# Patient Record
Sex: Female | Born: 1975 | Race: White | Hispanic: No | Marital: Married | Smoking: Never smoker
Health system: Southern US, Community
[De-identification: ages and names within clinical notes are randomized; demographics above are authoritative.]

## PROBLEM LIST (undated history)

## (undated) DIAGNOSIS — F419 Anxiety disorder, unspecified: Secondary | ICD-10-CM

## (undated) DIAGNOSIS — D649 Anemia, unspecified: Secondary | ICD-10-CM

## (undated) DIAGNOSIS — F32A Depression, unspecified: Secondary | ICD-10-CM

## (undated) DIAGNOSIS — K219 Gastro-esophageal reflux disease without esophagitis: Secondary | ICD-10-CM

## (undated) DIAGNOSIS — R112 Nausea with vomiting, unspecified: Secondary | ICD-10-CM

## (undated) DIAGNOSIS — R109 Unspecified abdominal pain: Secondary | ICD-10-CM

## (undated) DIAGNOSIS — K589 Irritable bowel syndrome without diarrhea: Secondary | ICD-10-CM

## (undated) DIAGNOSIS — Z9889 Other specified postprocedural states: Secondary | ICD-10-CM

## (undated) DIAGNOSIS — I10 Essential (primary) hypertension: Secondary | ICD-10-CM

## (undated) DIAGNOSIS — R51 Headache: Secondary | ICD-10-CM

## (undated) DIAGNOSIS — E0590 Thyrotoxicosis, unspecified without thyrotoxic crisis or storm: Secondary | ICD-10-CM

## (undated) DIAGNOSIS — G8929 Other chronic pain: Secondary | ICD-10-CM

## (undated) DIAGNOSIS — F329 Major depressive disorder, single episode, unspecified: Secondary | ICD-10-CM

## (undated) DIAGNOSIS — K3184 Gastroparesis: Secondary | ICD-10-CM

## (undated) HISTORY — PX: COLONOSCOPY: SHX174

## (undated) HISTORY — DX: Essential (primary) hypertension: I10

## (undated) HISTORY — PX: TUBAL LIGATION: SHX77

## (undated) HISTORY — PX: APPENDECTOMY: SHX54

## (undated) HISTORY — PX: BREAST SURGERY: SHX581

## (undated) HISTORY — DX: Irritable bowel syndrome, unspecified: K58.9

## (undated) HISTORY — DX: Other chronic pain: G89.29

## (undated) HISTORY — PX: ESOPHAGOGASTRODUODENOSCOPY: SHX1529

## (undated) HISTORY — DX: Unspecified abdominal pain: R10.9

---

## 1998-01-29 ENCOUNTER — Other Ambulatory Visit: Admission: RE | Admit: 1998-01-29 | Discharge: 1998-01-29 | Payer: Self-pay | Admitting: *Deleted

## 1999-02-24 ENCOUNTER — Other Ambulatory Visit: Admission: RE | Admit: 1999-02-24 | Discharge: 1999-02-24 | Payer: Self-pay | Admitting: *Deleted

## 2000-05-09 ENCOUNTER — Other Ambulatory Visit: Admission: RE | Admit: 2000-05-09 | Discharge: 2000-05-09 | Payer: Self-pay | Admitting: *Deleted

## 2001-05-13 ENCOUNTER — Other Ambulatory Visit: Admission: RE | Admit: 2001-05-13 | Discharge: 2001-05-13 | Payer: Self-pay | Admitting: *Deleted

## 2001-11-19 ENCOUNTER — Other Ambulatory Visit: Admission: RE | Admit: 2001-11-19 | Discharge: 2001-11-19 | Payer: Self-pay | Admitting: *Deleted

## 2001-12-23 ENCOUNTER — Ambulatory Visit (HOSPITAL_COMMUNITY): Admission: RE | Admit: 2001-12-23 | Discharge: 2001-12-23 | Payer: Self-pay | Admitting: Family Medicine

## 2001-12-23 ENCOUNTER — Encounter: Payer: Self-pay | Admitting: Family Medicine

## 2001-12-26 ENCOUNTER — Encounter: Payer: Self-pay | Admitting: Family Medicine

## 2001-12-26 ENCOUNTER — Ambulatory Visit (HOSPITAL_COMMUNITY): Admission: RE | Admit: 2001-12-26 | Discharge: 2001-12-26 | Payer: Self-pay | Admitting: Family Medicine

## 2001-12-26 ENCOUNTER — Encounter (INDEPENDENT_AMBULATORY_CARE_PROVIDER_SITE_OTHER): Payer: Self-pay | Admitting: *Deleted

## 2002-01-06 ENCOUNTER — Ambulatory Visit (HOSPITAL_COMMUNITY): Admission: RE | Admit: 2002-01-06 | Discharge: 2002-01-06 | Payer: Self-pay | Admitting: Family Medicine

## 2002-01-06 ENCOUNTER — Encounter (INDEPENDENT_AMBULATORY_CARE_PROVIDER_SITE_OTHER): Payer: Self-pay | Admitting: *Deleted

## 2002-01-06 ENCOUNTER — Encounter: Payer: Self-pay | Admitting: Family Medicine

## 2002-04-15 ENCOUNTER — Ambulatory Visit (HOSPITAL_COMMUNITY): Admission: RE | Admit: 2002-04-15 | Discharge: 2002-04-15 | Payer: Self-pay | Admitting: Neurology

## 2002-04-15 ENCOUNTER — Encounter: Payer: Self-pay | Admitting: Neurology

## 2002-05-15 ENCOUNTER — Other Ambulatory Visit: Admission: RE | Admit: 2002-05-15 | Discharge: 2002-05-15 | Payer: Self-pay | Admitting: *Deleted

## 2003-06-02 ENCOUNTER — Other Ambulatory Visit: Admission: RE | Admit: 2003-06-02 | Discharge: 2003-06-02 | Payer: Self-pay | Admitting: *Deleted

## 2003-08-05 HISTORY — PX: WISDOM TOOTH EXTRACTION: SHX21

## 2004-06-29 ENCOUNTER — Ambulatory Visit (HOSPITAL_COMMUNITY): Admission: RE | Admit: 2004-06-29 | Discharge: 2004-06-29 | Payer: Self-pay | Admitting: Family Medicine

## 2005-05-20 ENCOUNTER — Inpatient Hospital Stay (HOSPITAL_COMMUNITY): Admission: AD | Admit: 2005-05-20 | Discharge: 2005-05-20 | Payer: Self-pay | Admitting: Obstetrics and Gynecology

## 2005-05-22 ENCOUNTER — Inpatient Hospital Stay (HOSPITAL_COMMUNITY): Admission: AD | Admit: 2005-05-22 | Discharge: 2005-05-22 | Payer: Self-pay | Admitting: *Deleted

## 2005-12-27 ENCOUNTER — Inpatient Hospital Stay (HOSPITAL_COMMUNITY): Admission: AD | Admit: 2005-12-27 | Discharge: 2005-12-27 | Payer: Self-pay | Admitting: Obstetrics and Gynecology

## 2006-01-08 ENCOUNTER — Inpatient Hospital Stay (HOSPITAL_COMMUNITY): Admission: AD | Admit: 2006-01-08 | Discharge: 2006-01-13 | Payer: Self-pay | Admitting: Obstetrics and Gynecology

## 2006-01-19 ENCOUNTER — Encounter: Admission: RE | Admit: 2006-01-19 | Discharge: 2006-01-19 | Payer: Self-pay | Admitting: Obstetrics and Gynecology

## 2006-01-22 ENCOUNTER — Ambulatory Visit (HOSPITAL_COMMUNITY): Admission: RE | Admit: 2006-01-22 | Discharge: 2006-01-22 | Payer: Self-pay | Admitting: Family Medicine

## 2006-01-25 ENCOUNTER — Encounter (HOSPITAL_COMMUNITY): Admission: RE | Admit: 2006-01-25 | Discharge: 2006-02-24 | Payer: Self-pay | Admitting: Family Medicine

## 2006-01-25 ENCOUNTER — Encounter (INDEPENDENT_AMBULATORY_CARE_PROVIDER_SITE_OTHER): Payer: Self-pay | Admitting: *Deleted

## 2006-02-02 HISTORY — PX: BASAL CELL CARCINOMA EXCISION: SHX1214

## 2006-09-15 IMAGING — CR DG CHEST 2V
2 series · 2 of 2 positions shown · non-contrast
Comparison: 06/29/04.

CLINICAL DATA: Short of breath.
 CHEST X-RAY:

[view not recorded (1 of 2)]
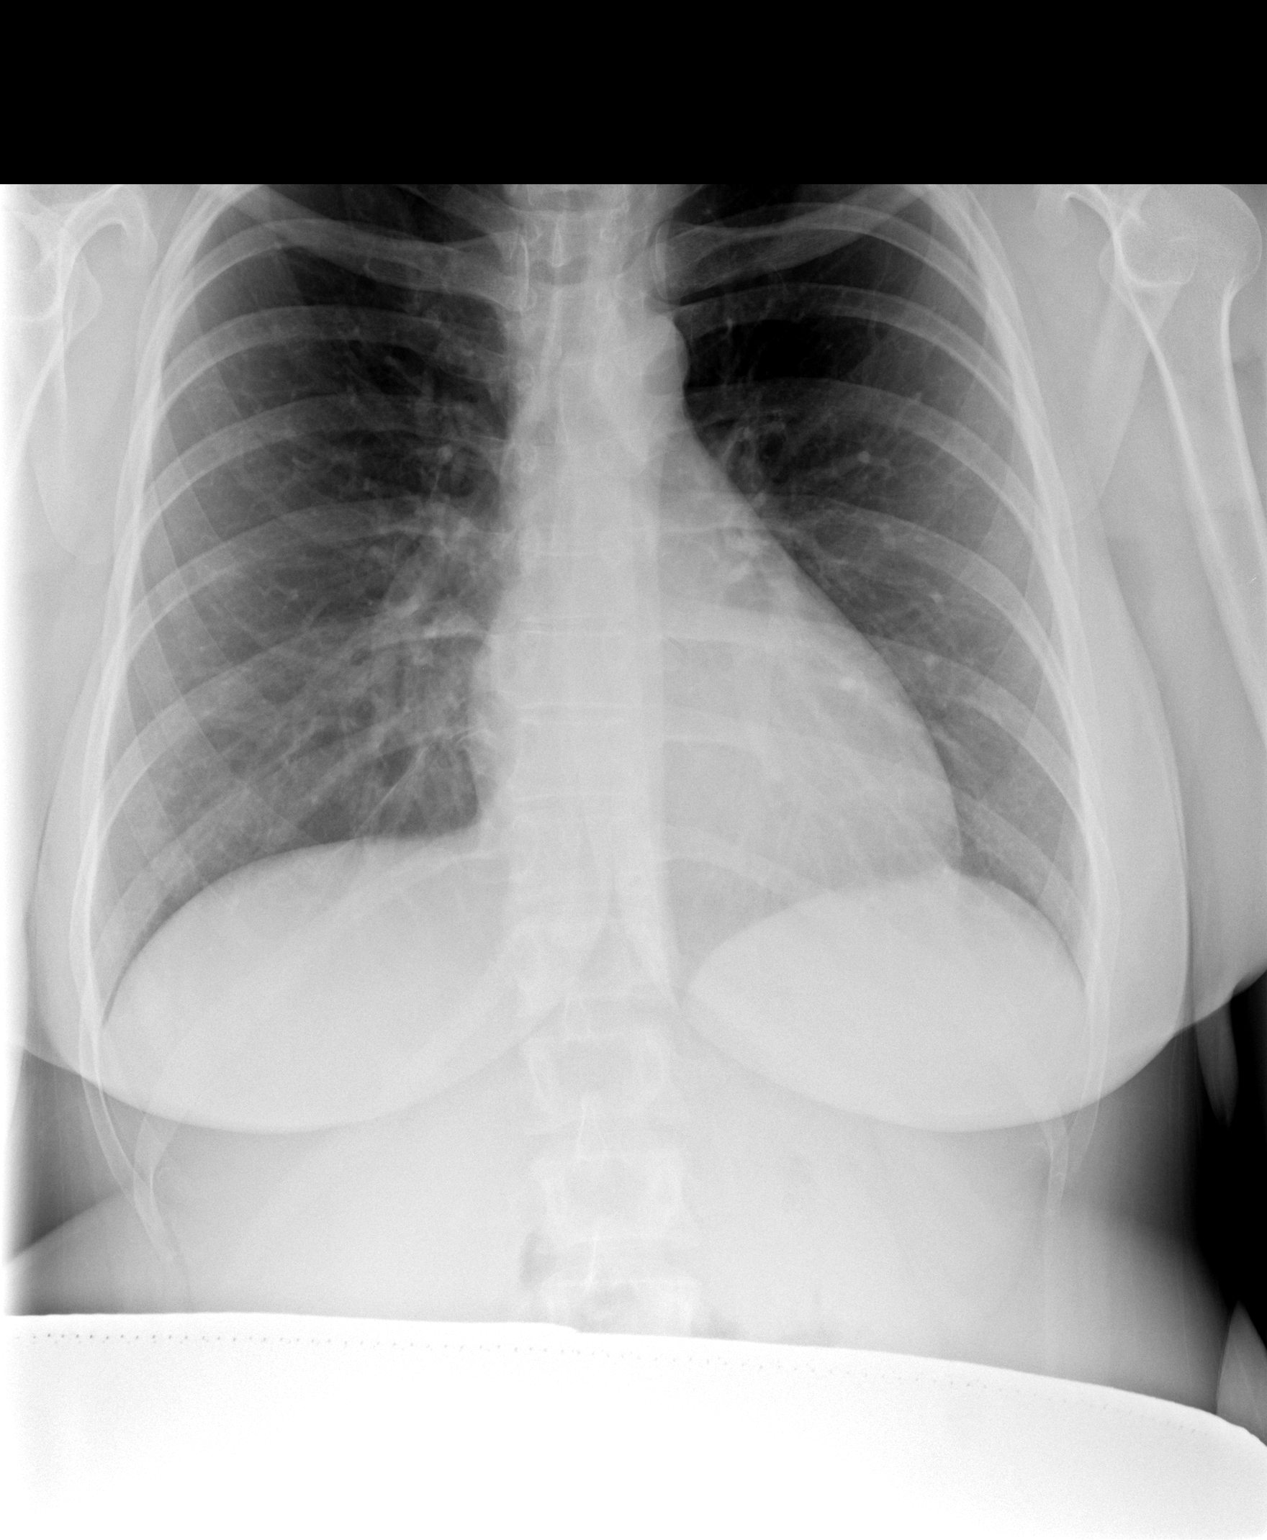

[view not recorded (2 of 2)]
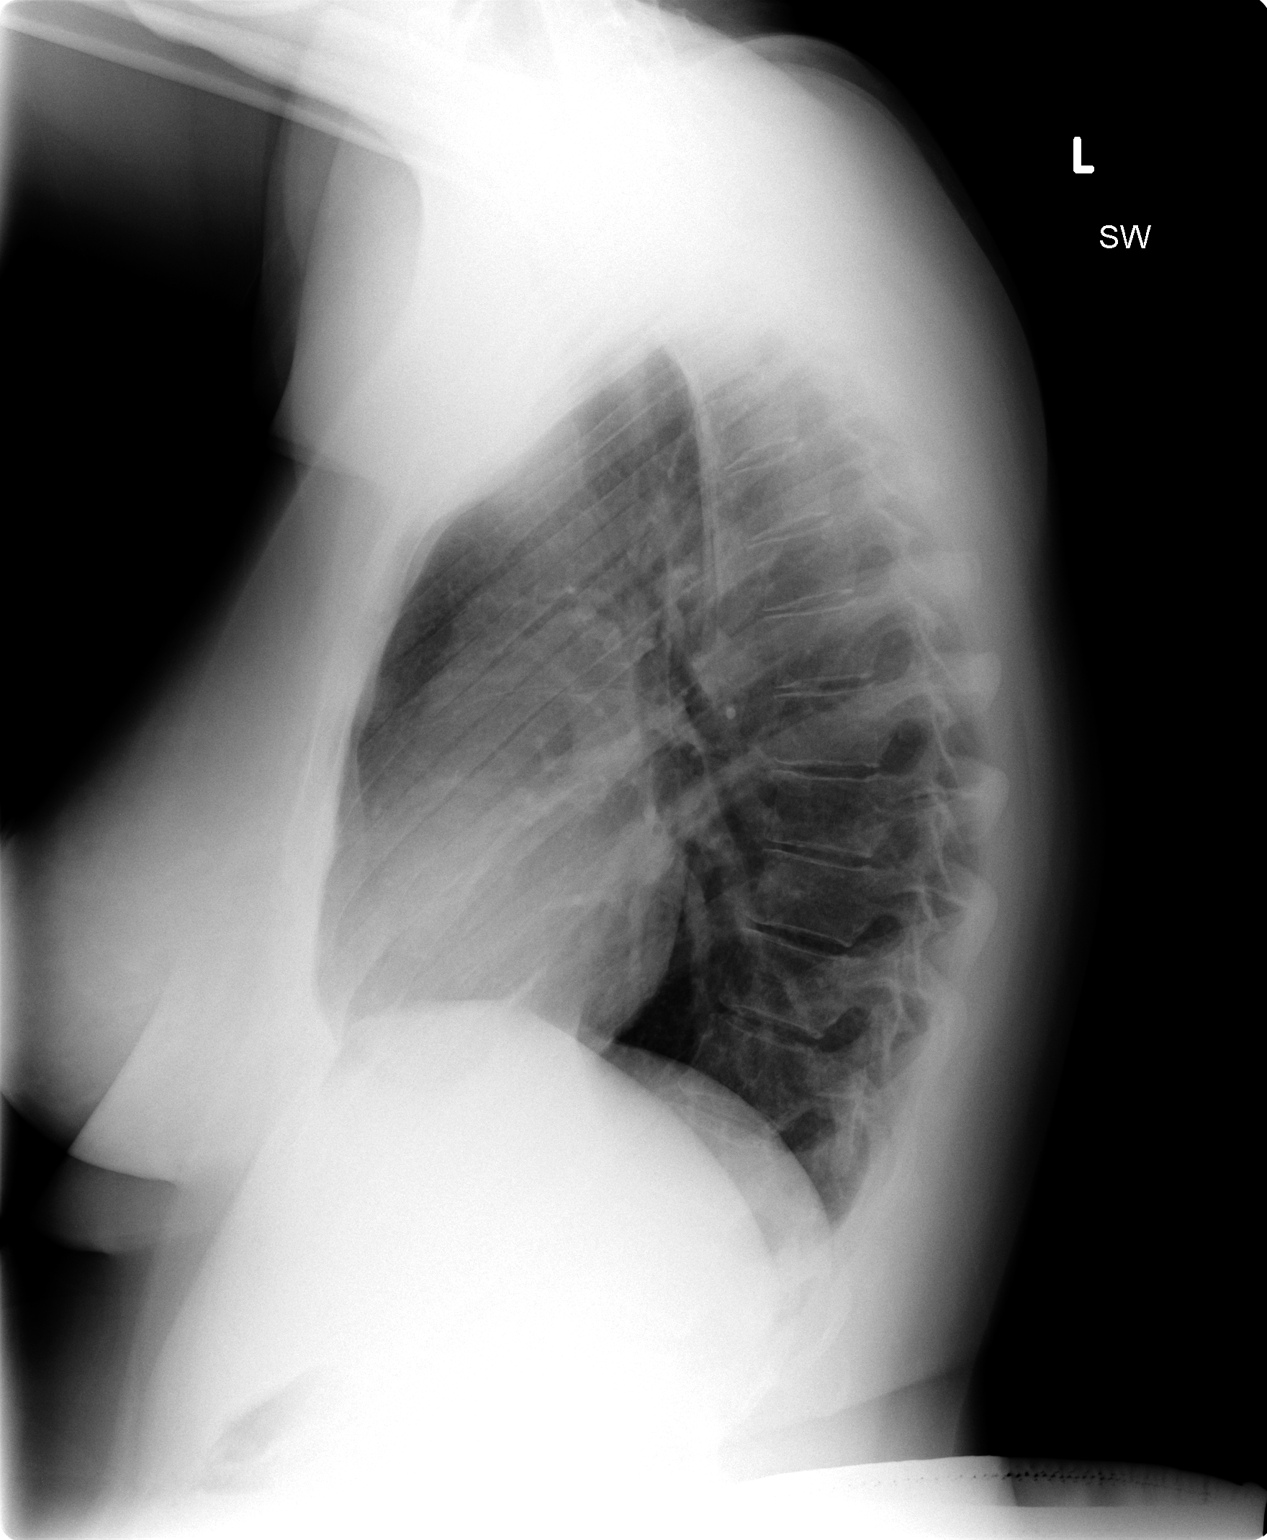

[2 of 2 positions shown; findings below may reference images not displayed]

The heart size and mediastinal contours are within normal limits.  Both lungs are clear.  The visualized skeletal structures are unremarkable.
IMPRESSION: No active cardiopulmonary disease.

## 2006-10-24 ENCOUNTER — Encounter: Payer: Self-pay | Admitting: Physician Assistant

## 2006-10-24 ENCOUNTER — Ambulatory Visit: Payer: Self-pay | Admitting: Internal Medicine

## 2006-10-24 ENCOUNTER — Encounter: Payer: Self-pay | Admitting: Internal Medicine

## 2006-11-12 ENCOUNTER — Encounter: Payer: Self-pay | Admitting: Internal Medicine

## 2006-11-12 ENCOUNTER — Ambulatory Visit: Payer: Self-pay | Admitting: Internal Medicine

## 2006-11-12 ENCOUNTER — Encounter: Payer: Self-pay | Admitting: Physician Assistant

## 2006-11-12 ENCOUNTER — Ambulatory Visit (HOSPITAL_COMMUNITY): Admission: RE | Admit: 2006-11-12 | Discharge: 2006-11-12 | Payer: Self-pay | Admitting: Internal Medicine

## 2006-11-14 ENCOUNTER — Ambulatory Visit: Payer: Self-pay | Admitting: Internal Medicine

## 2009-02-05 ENCOUNTER — Encounter (INDEPENDENT_AMBULATORY_CARE_PROVIDER_SITE_OTHER): Payer: Self-pay | Admitting: *Deleted

## 2009-02-05 ENCOUNTER — Ambulatory Visit (HOSPITAL_COMMUNITY): Admission: RE | Admit: 2009-02-05 | Discharge: 2009-02-05 | Payer: Self-pay | Admitting: Family Medicine

## 2009-04-29 ENCOUNTER — Encounter (INDEPENDENT_AMBULATORY_CARE_PROVIDER_SITE_OTHER): Payer: Self-pay | Admitting: *Deleted

## 2009-04-29 ENCOUNTER — Ambulatory Visit (HOSPITAL_COMMUNITY): Admission: RE | Admit: 2009-04-29 | Discharge: 2009-04-29 | Payer: Self-pay | Admitting: Family Medicine

## 2009-04-30 ENCOUNTER — Telehealth (INDEPENDENT_AMBULATORY_CARE_PROVIDER_SITE_OTHER): Payer: Self-pay | Admitting: *Deleted

## 2009-05-03 ENCOUNTER — Ambulatory Visit: Payer: Self-pay | Admitting: Internal Medicine

## 2009-05-03 ENCOUNTER — Encounter: Payer: Self-pay | Admitting: Physician Assistant

## 2009-05-03 DIAGNOSIS — R079 Chest pain, unspecified: Secondary | ICD-10-CM | POA: Insufficient documentation

## 2009-05-03 DIAGNOSIS — K219 Gastro-esophageal reflux disease without esophagitis: Secondary | ICD-10-CM

## 2009-05-03 DIAGNOSIS — R197 Diarrhea, unspecified: Secondary | ICD-10-CM | POA: Insufficient documentation

## 2009-05-20 DIAGNOSIS — Z8719 Personal history of other diseases of the digestive system: Secondary | ICD-10-CM | POA: Insufficient documentation

## 2009-05-20 DIAGNOSIS — R109 Unspecified abdominal pain: Secondary | ICD-10-CM | POA: Insufficient documentation

## 2009-05-26 ENCOUNTER — Ambulatory Visit: Payer: Self-pay | Admitting: Internal Medicine

## 2009-05-26 ENCOUNTER — Telehealth: Payer: Self-pay | Admitting: Internal Medicine

## 2009-05-27 ENCOUNTER — Ambulatory Visit: Payer: Self-pay | Admitting: Internal Medicine

## 2009-05-27 ENCOUNTER — Encounter: Payer: Self-pay | Admitting: Internal Medicine

## 2009-05-31 ENCOUNTER — Encounter: Payer: Self-pay | Admitting: Internal Medicine

## 2009-06-03 ENCOUNTER — Telehealth: Payer: Self-pay | Admitting: Internal Medicine

## 2009-06-14 ENCOUNTER — Ambulatory Visit: Payer: Self-pay | Admitting: Internal Medicine

## 2009-06-16 LAB — CONVERTED CEMR LAB
ALT: 13 units/L (ref 0–35)
Albumin: 4.4 g/dL (ref 3.5–5.2)
Alkaline Phosphatase: 51 units/L (ref 39–117)
Amylase: 112 units/L (ref 27–131)
Basophils Relative: 1.2 % (ref 0.0–3.0)
Eosinophils Relative: 2.1 % (ref 0.0–5.0)
Lipase: 37 units/L (ref 11.0–59.0)
Lymphs Abs: 2.3 10*3/uL (ref 0.7–4.0)
Monocytes Relative: 6.8 % (ref 3.0–12.0)
Neutrophils Relative %: 56 % (ref 43.0–77.0)
RDW: 12 % (ref 11.5–14.6)
Total Protein: 7.4 g/dL (ref 6.0–8.3)

## 2009-06-18 ENCOUNTER — Encounter: Admission: RE | Admit: 2009-06-18 | Discharge: 2009-06-18 | Payer: Self-pay | Admitting: Internal Medicine

## 2009-06-18 ENCOUNTER — Telehealth: Payer: Self-pay | Admitting: Internal Medicine

## 2009-06-21 ENCOUNTER — Telehealth: Payer: Self-pay | Admitting: Internal Medicine

## 2009-07-09 ENCOUNTER — Telehealth: Payer: Self-pay | Admitting: Internal Medicine

## 2009-08-09 ENCOUNTER — Telehealth: Payer: Self-pay | Admitting: Internal Medicine

## 2009-08-10 ENCOUNTER — Ambulatory Visit: Payer: Self-pay | Admitting: Internal Medicine

## 2009-08-17 ENCOUNTER — Encounter: Admission: RE | Admit: 2009-08-17 | Discharge: 2009-08-17 | Payer: Self-pay | Admitting: Internal Medicine

## 2009-08-17 ENCOUNTER — Telehealth: Payer: Self-pay | Admitting: Internal Medicine

## 2009-08-18 ENCOUNTER — Emergency Department (HOSPITAL_COMMUNITY): Admission: EM | Admit: 2009-08-18 | Discharge: 2009-08-18 | Payer: Self-pay | Admitting: Emergency Medicine

## 2009-08-18 ENCOUNTER — Encounter (INDEPENDENT_AMBULATORY_CARE_PROVIDER_SITE_OTHER): Payer: Self-pay | Admitting: *Deleted

## 2009-08-20 ENCOUNTER — Encounter (HOSPITAL_COMMUNITY): Admission: RE | Admit: 2009-08-20 | Discharge: 2009-09-01 | Payer: Self-pay | Admitting: Family Medicine

## 2009-08-20 ENCOUNTER — Ambulatory Visit: Payer: Self-pay | Admitting: Internal Medicine

## 2009-08-20 ENCOUNTER — Telehealth: Payer: Self-pay | Admitting: Internal Medicine

## 2009-08-20 ENCOUNTER — Encounter (INDEPENDENT_AMBULATORY_CARE_PROVIDER_SITE_OTHER): Payer: Self-pay | Admitting: *Deleted

## 2009-08-20 DIAGNOSIS — F341 Dysthymic disorder: Secondary | ICD-10-CM | POA: Insufficient documentation

## 2009-08-20 HISTORY — DX: Dysthymic disorder: F34.1

## 2009-09-04 DIAGNOSIS — E0590 Thyrotoxicosis, unspecified without thyrotoxic crisis or storm: Secondary | ICD-10-CM

## 2009-09-04 HISTORY — DX: Thyrotoxicosis, unspecified without thyrotoxic crisis or storm: E05.90

## 2009-09-17 ENCOUNTER — Encounter: Payer: Self-pay | Admitting: Internal Medicine

## 2009-09-20 ENCOUNTER — Telehealth: Payer: Self-pay | Admitting: Internal Medicine

## 2009-09-28 ENCOUNTER — Ambulatory Visit: Payer: Self-pay | Admitting: Internal Medicine

## 2009-09-29 ENCOUNTER — Telehealth: Payer: Self-pay | Admitting: Internal Medicine

## 2009-09-29 ENCOUNTER — Encounter (INDEPENDENT_AMBULATORY_CARE_PROVIDER_SITE_OTHER): Payer: Self-pay | Admitting: *Deleted

## 2009-09-30 ENCOUNTER — Encounter: Payer: Self-pay | Admitting: Internal Medicine

## 2009-10-13 ENCOUNTER — Ambulatory Visit (HOSPITAL_COMMUNITY): Admission: RE | Admit: 2009-10-13 | Discharge: 2009-10-14 | Payer: Self-pay | Admitting: General Surgery

## 2009-10-13 ENCOUNTER — Encounter (INDEPENDENT_AMBULATORY_CARE_PROVIDER_SITE_OTHER): Payer: Self-pay | Admitting: General Surgery

## 2009-10-13 ENCOUNTER — Encounter (INDEPENDENT_AMBULATORY_CARE_PROVIDER_SITE_OTHER): Payer: Self-pay | Admitting: *Deleted

## 2009-10-25 ENCOUNTER — Ambulatory Visit: Payer: Self-pay | Admitting: Internal Medicine

## 2009-10-25 LAB — CONVERTED CEMR LAB
Eosinophils Absolute: 0.1 10*3/uL (ref 0.0–0.7)
Eosinophils Relative: 1.2 % (ref 0.0–5.0)
HCT: 37.4 % (ref 36.0–46.0)
Hemoglobin: 12.5 g/dL (ref 12.0–15.0)
Lymphocytes Relative: 25.8 % (ref 12.0–46.0)
Lymphs Abs: 1.9 10*3/uL (ref 0.7–4.0)
Monocytes Absolute: 0.4 10*3/uL (ref 0.1–1.0)
Monocytes Relative: 6.1 % (ref 3.0–12.0)
Neutro Abs: 4.6 10*3/uL (ref 1.4–7.7)
Neutrophils Relative %: 64.6 % (ref 43.0–77.0)
Platelets: 295 10*3/uL (ref 150.0–400.0)
RBC: 4 M/uL (ref 3.87–5.11)
Sed Rate: 8 mm/hr (ref 0–22)
TSH: 0.28 microintl units/mL — ABNORMAL LOW (ref 0.35–5.50)
Vitamin B-12: 367 pg/mL (ref 211–911)

## 2009-10-27 ENCOUNTER — Encounter: Payer: Self-pay | Admitting: Internal Medicine

## 2009-11-01 ENCOUNTER — Encounter (HOSPITAL_COMMUNITY): Admission: RE | Admit: 2009-11-01 | Discharge: 2009-12-01 | Payer: Self-pay | Admitting: Family Medicine

## 2009-11-01 ENCOUNTER — Encounter (INDEPENDENT_AMBULATORY_CARE_PROVIDER_SITE_OTHER): Payer: Self-pay | Admitting: *Deleted

## 2009-11-25 ENCOUNTER — Telehealth (INDEPENDENT_AMBULATORY_CARE_PROVIDER_SITE_OTHER): Payer: Self-pay | Admitting: *Deleted

## 2010-09-25 ENCOUNTER — Encounter: Payer: Self-pay | Admitting: Internal Medicine

## 2010-09-25 ENCOUNTER — Encounter: Payer: Self-pay | Admitting: General Surgery

## 2010-10-04 NOTE — Assessment & Plan Note (Signed)
Summary: F/U FROM ENDO.                Alicia Owens   History of Present Illness Visit Type: Follow-up Visit Primary GI MD: Lina Sar MD Primary Provider: Lilyan Punt, MD Requesting Provider: n/a Chief Complaint: F/u from endo  History of Present Illness:   This is a 35 year old white female with functional abdominal pain who has been through an exhaustive GI workup which has included a negative upper endoscopy and small bowel biopsies. She had a normal colonoscopy in September 2010. and a normal barium enema which ruled out a mobile cecum. She had a normal CT scan of the abdomen and pelvis in November 2010. Her upper abdominal ultrasound and HIDA scans were normal showing a 73% ejection fraction. She underwent a laparoscopic surgery by her gynecologist and by Dr.Toth 10 days ago with findings of no endometriosis. She underwent an appendectomy and she was noted to have a small right inguinal hernia. The gallbladder appeared normal and was not removed. She is on multiple medications for headaches and her main symptoms today is constant nausea for which she takes Zofran 4 mg 1-2 a day. She denies any excessive stress. She denies any problems with her marriage, problems with her in-laws or financial problems. She has been followed by Dr.J.Love for migraine headaches and is scheduled for an MRI of  the neck and spine. Additional complaints are   numbness of her legs and anxiety. Her weight has decreased from an initial 150 pounds to a current 123 pounds. She was 130 pounds during her last visit in December 2010.   GI Review of Systems      Denies abdominal pain, acid reflux, belching, bloating, chest pain, dysphagia with liquids, dysphagia with solids, heartburn, loss of appetite, nausea, vomiting, vomiting blood, weight loss, and  weight gain.        Denies anal fissure, black tarry stools, change in bowel habit, constipation, diarrhea, diverticulosis, fecal incontinence, heme positive stool,  hemorrhoids, irritable bowel syndrome, jaundice, light color stool, liver problems, rectal bleeding, and  rectal pain.    Current Medications (verified): 1)  Claritin 10 Mg Tabs (Loratadine) .... Take 1 Tablet By Mouth Once A Day 2)  Topiramate 100 Mg Tabs (Topiramate) .... .qdtdab 3)  Sumatriptan Succinate 100 Mg Tabs (Sumatriptan Succinate) .... Take As Needed For Headache 4)  Ibuprofen 200 Mg Tabs (Ibuprofen) .... Take 2 Tablets By Mouth As Needed 5)  Levsin/sl 0.125 Mg Subl (Hyoscyamine Sulfate) .... Put One Under The Tongue Evey 4-6 Hours As Needed For Cramping and Spasms 6)  Dicyclomine Hcl 20 Mg Tabs (Dicyclomine Hcl) .... As Needed 7)  Prilosec 20 Mg Cpdr (Omeprazole) .... Take 1 Tablet By Mouth Every Morning and With Dinner 8)  Tylenol Extra Strength 500 Mg Tabs (Acetaminophen) .... As Needed 9)  Zofran 4 Mg Tabs (Ondansetron Hcl) .... Take 1 Every 4-6 Hours  As Needed For Nausea. 10)  Percocet 5-325 Mg Tabs (Oxycodone-Acetaminophen) .... Take 1 Every 6 Hours As Needed For Pain 11)  Xanax 0.5 Mg  Tabs (Alprazolam) .... By Mouth Once or Twice A Day 12)  Aleve 220 Mg Tabs (Naproxen Sodium) .... As Needed 13)  Gas-X 80 Mg Chew (Simethicone) .... One or Two Tablets By Mouth As Needed 14)  Cyclobenzaprine Hcl 10 Mg Tabs (Cyclobenzaprine Hcl) .... One Tablet By Mouth Once Daily  Allergies (verified): 1)  ! Codeine 2)  ! Sulfa 3)  ! Phenergan  Past History:  Past Medical History:  Reviewed history from 05/20/2009 and no changes required. Current Problems:  GASTRITIS, HX OF (ICD-V12.79) ABDOMINAL PAIN, UNSPECIFIED SITE (ICD-789.00) CHEST PAIN (ICD-786.50) GERD (ICD-530.81) DIARRHEA (ICD-787.91)    Past Surgical History: Reviewed history from 05/03/2009 and no changes required. C-Section Breast Reduction Wisdom Teeth removal  Family History: Reviewed history from 05/03/2009 and no changes required. Family History of Breast Cancer: Great Grandmother No FH of Colon  Cancer:  Social History: Reviewed history from 05/03/2009 and no changes required. 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  Alcohol Use - yes-1 drink per month Daily Caffeine Use-1-2 cups daily Illicit Drug Use - no Patient does not get regular exercise.   Review of Systems  The patient denies allergy/sinus, anemia, anxiety-new, arthritis/joint pain, back pain, blood in urine, breast changes/lumps, change in vision, confusion, cough, coughing up blood, depression-new, fainting, fatigue, fever, headaches-new, hearing problems, heart murmur, heart rhythm changes, itching, menstrual pain, muscle pains/cramps, night sweats, nosebleeds, pregnancy symptoms, shortness of breath, skin rash, sleeping problems, sore throat, swelling of feet/legs, swollen lymph glands, thirst - excessive , urination - excessive , urination changes/pain, urine leakage, vision changes, and voice change.         Pertinent positive and negative review of systems were noted in the above HPI. All other ROS was otherwise negative.   Vital Signs:  Patient profile:   35 year old female Height:      61 inches Weight:      123 pounds BMI:     23.32 BSA:     1.54 Pulse rate:   86 / minute Pulse rhythm:   regular BP sitting:   98 / 62  (left arm) Cuff size:   regular  Vitals Entered By: Ok Anis CMA (October 25, 2009 8:57 AM)  Physical Exam  General:  alert and oriented, very cooperative but appears depressed. Eyes:  PERRLA, no icterus. Mouth:  No deformity or lesions, dentition normal. Neck:  Supple; no masses or thyromegaly. Lungs:  Clear throughout to auscultation. Heart:  Regular rate and rhythm; no murmurs, rubs,  or bruits. Abdomen:  soft but very tender abdomen with laparoscopic incision site in the left the lower and upper quadrants as well as in the periumbilical area. There is mild bruising and tenderness around the incision points. Bowel sounds are normal active. There is no distention and no  palpable mass. Extremities:  No clubbing, cyanosis, edema or deformities noted. Neurologic:  Alert and oriented x4;  grossly normal neurologically. Skin:  Intact without significant lesions or rashes. Psych:  Alert and cooperative. Normal mood and affect.   Impression & Recommendations:  Problem # 1:  ABDOMINAL PAIN, UNSPECIFIED SITE (ICD-789.00) Patient has right upper quadrant abdominal pain of unknown etiology. There is no evidence of organic disease after a complete workup including laparoscopic exploration. I feel that her abdominal pain is functional and in some way associated with the depression. It could also represent an abdominal migraine. We will try her on Reglan 5 mg times a day before meals for nausea in addition to Zofran. No further GI evaluation is planned at this point. She needs to go back to see Dr Gerda Diss and possibly to see a psychiatrist. She is also going to follow up with Dr. Sandria Manly for migraine headaches at which time he will rule out a brain tumor, MS etc. Orders: TLB-TSH (Thyroid Stimulating Hormone) (84443-TSH) TLB-CBC Platelet - w/Differential (85025-CBCD) TLB-Sedimentation Rate (ESR) (85652-ESR) TLB-B12, Serum-Total ONLY (82607-B12) TLB-A1C / Hgb A1C (Glycohemoglobin) (83036-A1C) TLB-Glucose, QUANT (82947-GLU)  Problem #  2:  ANXIETY DEPRESSION (ICD-300.4) This may be  her only  health problems at this time. She is Xanax as well as Percocet for abdominal pain. At her request today, we are going to check her TSH, CBC, sedimentation rate, B12 and hemoglobin A1C.  Problem # 3:  GERD (ICD-530.81) controlled with Prilosec 20 mg twice a day. Orders: TLB-TSH (Thyroid Stimulating Hormone) (84443-TSH) TLB-CBC Platelet - w/Differential (85025-CBCD) TLB-Sedimentation Rate (ESR) (85652-ESR) TLB-B12, Serum-Total ONLY (82607-B12) TLB-A1C / Hgb A1C (Glycohemoglobin) (83036-A1C) TLB-Glucose, QUANT (82947-GLU)  Patient Instructions: 1)  Gastrointestinal workup has been  completed and no further tests are planned at this time. 2)  Followup with Dr.Love for evaluation of headaches and to rule out other neurological or CNS disorders. 3)  Follow up with Dr.Luking; consider psychiatric followup. 4)  Reglan 5 mg p.o. t.i.d. before meals on a trial basis. 5)  Lab tests today. 6)  Copy sent to : Dr Kathie Rhodes.Luking, Dr Ileene Rubens, Dr Billy Coast, Dr Demetrius Charity.Toth 7)  The medication list was reviewed and reconciled.  All changed / newly prescribed medications were explained.  A complete medication list was provided to the patient / caregiver. Prescriptions: REGLAN 5 MG TABS (METOCLOPRAMIDE HCL) Take 1 tablet by mouth three times a day before meals.  #90 x 1   Entered by:   Hortense Ramal CMA (AAMA)   Authorized by:   Hart Carwin MD   Signed by:   Hortense Ramal CMA (AAMA) on 10/25/2009   Method used:   Electronically to        [facility]* (retail)       [address]       [place], [place]  [address]       Ph: [phone] or [phone]       Fax: [phone]   RxID:   [id number] PRILOSEC 20 MG CPDR (OMEPRAZOLE) Take 1 tablet by mouth two times a day  #60 x 5   Entered by:   Hortense Ramal CMA (AAMA)   Authorized by:   Hart Carwin MD   Signed by:   Hortense Ramal CMA (AAMA) on 10/25/2009   Method used:   Electronically to        [facility] (retail)       [address]       [place], [place]  [address]       Ph: [phone] or [phone]       Fax: [phone]   RxID:   [id number]

## 2010-10-04 NOTE — Letter (Signed)
Summary: Patient [facility] Biopsy Results  [facility]  [address] [place], [place] [address]   Phone: [phone]  Fax: [phone]        September 30, 2009 MRN: [id number]    TANE BIEGLER [address] [place], [place]  [address]    Dear Ms. Weisner,  I am pleased to inform you that the biopsies taken during your recent endoscopic examination did not show any evidence of cancer upon pathologic examination.The tissue biopsy from Your stomach was normal  Additional information/recommendations:  __No further action is needed at this time.  Please follow-up with      your primary care physician for your other healthcare needs.  __ Please call [phone] to schedule a return visit to review      your condition.  _x_ Continue with the treatment plan as outlined on the day of your      exam.     Please call us if you are having persistent problems or have questions about your condition that have not been fully answered at this time.  Sincerely,  Hart Carwin MD  This letter has been electronically signed by your physician.  Appended Document: Patient Notice-Endo Biopsy Results Letter mailed 1.28.11

## 2010-10-04 NOTE — Procedures (Signed)
Summary: Upper Endoscopy  Patient: Alicia Owens Note: All result statuses are Final unless otherwise noted.  Tests: (1) Upper Endoscopy (EGD)   EGD Upper Endoscopy       DONE     [facility]     [address]     [place], [place]  [address]           ENDOSCOPY PROCEDURE REPORT           PATIENT:  [name], [name]  MR#:  [id number]     BIRTHDATE:  [birth year], 33 yrs. old  GENDER:  female           ENDOSCOPIST:  Hedwig Morton. Juanda Chance, MD     Referred by:  Lilyan Punt, M.D.           PROCEDURE DATE:  09/28/2009     PROCEDURE:  EGD with biopsy     ASA CLASS:  Class I     INDICATIONS:  abdominal pain negative work up so far,           MEDICATIONS:   Versed 10 mg, Fentanyl 100 mcg     TOPICAL ANESTHETIC:  Exactacain Spray           DESCRIPTION OF PROCEDURE:   After the risks benefits and     alternatives of the procedure were thoroughly explained, informed     consent was obtained.  The [facility] [id number] endoscope was     introduced through the mouth and advanced to the second portion of     the duodenum, without limitations.  The instrument was slowly     withdrawn as the mucosa was fully examined.     <<PROCEDUREIMAGES>>           Mild gastritis was found. antral erythema A biopsy for H. pylori     was taken (see image4).  Otherwise the examination was normal.     With standard forceps, a biopsy was obtained and sent to pathology     (see image7, image6, image5, image3, and image2). s/p duodenal     biopsy    Retroflexed views revealed no abnormalities.    The     scope was then withdrawn from the patient and the procedure     completed.           COMPLICATIONS:  None           ENDOSCOPIC IMPRESSION:     1) Mild gastritis     2) Otherwise normal examination     s/p small bowl biopsies and CLO test     nothing to account fot the abdominal pain, which I think, is     functional     RECOMMENDATIONS:     prelosec 20 mg po bid     Xanax.5 mg po qd or bid     Paxil 10 mg  po hs           REPEAT EXAM:  In 0 year(s) for.           ______________________________     Hedwig Morton. Juanda Chance, MD           CC:           n.     eSIGNED:   Hedwig Morton. Tyreshia Ingman at 09/28/2009 03:19 PM           [place], [place], [phone]  Note: An exclamation mark (!) indicates a result that was not dispersed into the flowsheet. Document Creation Date: 09/28/2009  3:20 PM _______________________________________________________________________  (1) Order result status: Final Collection or observation date-time: 09/28/2009 15:08 Requested date-time:  Receipt date-time:  Reported date-time:  Referring Physician:   Ordering Physician: Lina Sar [phone]) Specimen Source:  Source: Launa Grill Order Number: [id number] Lab site:

## 2010-10-04 NOTE — Letter (Signed)
Summary: Office Visit Letter  [facility]  [address] [place], [place] [address]   Phone: [phone]  Fax: [phone]      September 29, 2009 MRN: [id number]   Alicia Owens [address] [place], [place]  [address]   Dear Ms. Rubi,   According to our records, it is time for you to schedule a follow-up office visit with [place].   At your convenience, please call [phone] (option #2)to schedule an office visit. If you have any questions, concerns, or feel that this letter is in error, we would appreciate your call.   Sincerely,  Hedwig Morton. Juanda Chance, M.D.  [facility] [phone]

## 2010-10-04 NOTE — Progress Notes (Signed)
  Phone Note Other Incoming   Request: Send information Summary of Call: Request received from release of information form  forwarded to [employer].

## 2010-10-04 NOTE — Progress Notes (Signed)
Summary: TRIAGE  Phone Note Call from Patient Call back at [phone]   Caller: Patient Call For: Dr. Juanda Chance Reason for Call: Talk to Nurse Summary of Call: Pt has some questions about her procedure she had yesterday and about some referral appt.'s Initial call taken by: Karna Christmas,  September 29, 2009 11:25 AM  Follow-up for Phone Call        1) Pt. had an Endo. done 09-28-09. She gave Dr.Martasia Talamante a note about getting a refill about Percocet and Zofran. Dr.Luking prescribed them to her, but she wants Dr.Betzayda Braxton to refill them.  2) Her husband states Dr.Miaisabella Bacorn made a comment about her seeing a Careers adviser, "That bothered me"  Also, Dr.Vincen Bejar was asking Mr.Schorsch his opinion about things and she is uncomfortable about this. "I was concerned because she didn't act like she remembered she sent me to see the surgeon and she has already talked to my Gynocologist and that upsets me" Pt. is now crying on the phone.   3) "When am I supposed to follow-up with her?"  Verline Lema CALL PT AT [phone] OR [phone]  Follow-up by: Laureen Ochs LPN,  September 29, 2009 11:40 AM  Additional Follow-up for Phone Call Additional follow up Details #1::        As far as I know I have asked the nurse to refill her meds, so she ought to talk to her Pharmacy. I have talked to her husband since pt was not alert. I wanted to know his opinion because I feel that alot of her problem is anxiety. He did not know, He was nice, I might have mentioned a Careers adviser as a last resor. I said I was skeptical since all her tests have been negative. She is an extremely anxious person and she needs to come back to talk about it. I have sent a report to her GYN Dr Billy Coast. Additional Follow-up by: Hart Carwin MD,  September 29, 2009 7:50 PM    Additional Follow-up for Phone Call Additional follow up Details #2::    Above MD orders reviewed with patient.  Meds to her pharmacy. Pt. will call to schedule an appt. when she gets to  work to review her schedule. Pt. instructed to call back as needed.  Follow-up by: Laureen Ochs LPN,  September 30, 2009 8:15 AM  Additional Follow-up for Phone Call Additional follow up Details #3:: Details for Additional Follow-up Action Taken: Pt. called and scheduled her f/u appt. with Dr.Myley Bahner for 10-25-09 at 9am. Pt. instructed to call back as needed.  Additional Follow-up by: Laureen Ochs LPN,  September 30, 2009 9:05 AM  New/Updated Medications: ZOFRAN 4 MG TABS (ONDANSETRON HCL) take 1 every 4-6 hours  as needed for nausea. PERCOCET 5-325 MG TABS (OXYCODONE-ACETAMINOPHEN) take 1 every 6 hours as needed for pain Prescriptions: PERCOCET 5-325 MG TABS (OXYCODONE-ACETAMINOPHEN) take 1 every 6 hours as needed for pain  #30 x 0   Entered by:   Laureen Ochs LPN   Authorized by:   Hart Carwin MD   Signed by:   Laureen Ochs LPN on 19/14/7829   Method used:   Printed then faxed to ...       [facility]* (retail)       [address]       [place], [place]  [address]       Ph: [phone] or [phone]       Fax: [phone]   RxID:  [id number] ZOFRAN 4 MG TABS (ONDANSETRON HCL) take 1 every 4-6 hours  as needed for nausea.  #30 x 1   Entered by:   Laureen Ochs LPN   Authorized by:   Hart Carwin MD   Signed by:   Laureen Ochs LPN on 14/78/2956   Method used:   Printed then faxed to ...       [facility]* (retail)       [address]       [place], [place]  [address]       Ph: [phone] or [phone]       Fax: [phone]   RxID:   [id number]

## 2010-10-04 NOTE — Op Note (Signed)
Summary: Exploratory Laparotomy/Appendectomy       NAME:  Alicia Owens, Alicia Owens                ACCOUNT NO.:  [id number]      MEDICAL RECORD NO.:  [id number]          PATIENT TYPE:  AMB      LOCATION:  DAY                          FACILITY:  [facility]      PHYSICIAN:  Ollen Gross. Vernell Morgans, M.D. DATE OF BIRTH:  Nov 01, 1975      DATE OF PROCEDURE:  10/13/2009   DATE OF DISCHARGE:                                  OPERATIVE REPORT      PREOPERATIVE DIAGNOSIS:  Abdominal pain of unclear etiology.      POSTOPERATIVE DIAGNOSIS:   1. Abdominal pain of unclear etiology.   2. Abnormal appearing appendix.   3. Small right inguinal hernia.      PROCEDURE:  Diagnostic laparoscopy, laparoscopic appendectomy.      SURGEON:  Ollen Gross. Vernell Morgans, M.D.      ASSISTANT:  Dr. Almond Lint, MD      ANESTHESIA:  General endotracheal.      PROCEDURE IN DETAIL:  After informed consent was obtained, the patient   was brought to the operating and placed in supine position on the   operating table.  After induction of general anesthesia the patient's   abdomen was prepped with Betadine and draped in usual sterile manner.   The area below the umbilicus was infiltrated with 0.25%  Marcaine.  A   small incision was made with a 15 blade knife.  This incision was   carried down through the subcutaneous tissue bluntly with hemostat and   Army[employer] retractors until the linea alba was identified.  The linea   alba was incised with a 15 blade knife and each side was grasped with   Kocher clamps and elevated anteriorly.  The preperitoneal space was then   probed bluntly with a hemostat until the peritoneum was opened and   access was gained to the abdominal cavity.  A zero Vicryl pursestring   stitch was placed in the fascia around the opening.  Hassan cannula  was   placed through the opening and anchored in place with previously  placed   Vicryl pursestring stitch.  The abdomen was then insufflated with carbon   dioxide without  difficulty.  The laparoscope was inserted through the   [facility] cannula  and abdomen was inspected.  Pretty much the entire small   bowel was run and I did not see any abnormalities there.  The colon all   looked normal.  She did have a fair bit of stool in it but it was all   soft with no areas of inflammation.  The liver and gallbladder were   totally normal looking.  The  gallbladder was robin's egg blue with no   adhesions to it.  The tubes and ovaries were normal.  The uterus looked   normal.  There was some scarring on the anterior surface from her   previous C-section.  No evidence of endometriosis.  She did have a   moderate amount of free clear yellow fluid down in the  pelvis.  The   appendix had a  fairly normal appearance but it looked a little bit firm   at the tip and a little bit dilated near the base but was otherwise   soft.  Of note, there was also a very small right inguinal hernia also   noted.  At this point because of the appearance of the appendix it was   decided to remove the appendix.  Sites were chosen on the left lower   abdomen for placement of  5-mm ports.  Each of these areas infiltrated   with 0.25%  Marcaine.  Small stab incisions were made with a 15 blade   knife and 5 mm ports were placed bluntly through these incisions into   the abdominal cavity under direct vision.  Using a Glassman grasper and   harmonic scalpel, the appendix was elevated.  The mesoappendix was taken   down sharply with the harmonic scalpel and then through the [facility], the   5 mm camera was used and stapling device was placed through the [facility]   cannula across the base of the appendix at its junction with the cecum,   clamped and fired and divided at the base of the appendix between staple   lines.  The staple line was hemostatic.  A laparoscopic bag was inserted   through the [facility] cannula.  The appendix was placed in the bag and bag   was sealed.  The abdomen was then irrigated with  copious amounts of   saline.  The appendix was removed in the bag with the [facility] cannula   through the infraumbilical port.  The fascial defect was closed with the   previously placed Vicryl pursestring stitch as well as another figure-of-   eight zero Vicryl stitch.  The rest of the ports were removed under   direct vision.  The gas was allowed to escape.  The skin incisions were   all closed with interrupted 4-0 Monocryl subcuticular stitches and   Dermabond dressings were applied.  The patient tolerated the procedure   well.  At the end of the case all needle, sponge and instrument counts   were correct.  The patient was awakened, taken to recovery room in   stable condition.               Ollen Gross. Vernell Morgans, M.D.            PST/MEDQ  D:  10/13/2009  T:  10/13/2009  Job:  [id number]         Electronically Signed by Chevis Pretty III M.D. on 10/18/2009 07:57:05 AM

## 2010-10-04 NOTE — Procedures (Signed)
Summary: [facility]  [facility]   Imported By: Lennie Odor 09/30/2009 11:59:12  _____________________________________________________________________  External Attachment:    Type:   Image     Comment:   External Document

## 2010-10-04 NOTE — Miscellaneous (Signed)
Summary: Prilosec, Alprazolam Rx  Clinical Lists Changes  Medications: Added new medication of PRILOSEC OTC 20 MG  TBEC (OMEPRAZOLE MAGNESIUM) 1 twice a day 30 minutes before meals - Signed Added new medication of XANAX 0.5 MG  TABS (ALPRAZOLAM) by mouth once or twice a day - Signed Changed medication from PRILOSEC OTC 20 MG  TBEC (OMEPRAZOLE MAGNESIUM) 1 twice a day 30 minutes before meals to PRILOSEC 20 MG CPDR (OMEPRAZOLE) Take 1 tablet by mouth two times a day (pharmacy-please d/c rx for omeprazole OTC) - Signed Rx of PRILOSEC OTC 20 MG  TBEC (OMEPRAZOLE MAGNESIUM) 1 twice a day 30 minutes before meals;  #60 x 5;  Signed;  Entered by: Oda Cogan;  Authorized by: Hart Carwin MD;  Method used: Electronically to [facility]*, [address], [place], [place], [place]  [address], Ph: [phone] or [phone], Fax: [phone] Rx of XANAX 0.5 MG  TABS (ALPRAZOLAM) by mouth once or twice a day;  #60 x 0;  Signed;  Entered by: Oda Cogan;  Authorized by: Hart Carwin MD;  Method used: Print then Give to Patient Rx of PRILOSEC 20 MG CPDR (OMEPRAZOLE) Take 1 tablet by mouth two times a day (pharmacy-please d/c rx for omeprazole OTC);  #60 x 5;  Signed;  Entered by: Hortense Ramal CMA (AAMA);  Authorized by: Hart Carwin MD;  Method used: Electronically to [facility]*, [address] [place], [place], [place]  [address], Ph: [phone] or [phone], Fax: [phone]    Prescriptions: PRILOSEC 20 MG CPDR (OMEPRAZOLE) Take 1 tablet by mouth two times a day (pharmacy-please d/c rx for omeprazole OTC)  #60 x 5   Entered by:   Hortense Ramal CMA (AAMA)   Authorized by:   Hart Carwin MD   Signed by:   Hortense Ramal CMA (AAMA) on 09/28/2009   Method used:   Electronically to        [facility]* (retail)       [address]       [place], [place]  [address]       Ph: [phone] or [phone]       Fax: [phone]   RxID:    [id number] Prudy Feeler 0.5 MG  TABS (ALPRAZOLAM) by mouth once or twice a day  #60 x 0   Entered by:   Oda Cogan   Authorized by:   Hart Carwin MD   Signed by:   Oda Cogan on 09/28/2009   Method used:   Print then Give to Patient   RxID:   [id number] PRILOSEC OTC 20 MG  TBEC (OMEPRAZOLE MAGNESIUM) 1 twice a day 30 minutes before meals  #60 x 5   Entered by:   Oda Cogan   Authorized by:   Hart Carwin MD   Signed by:   Oda Cogan on 09/28/2009   Method used:   Electronically to        [facility] (retail)       [address]       [place], [place]  [address]       Ph: [phone] or [phone]       Fax: [phone]   RxID:   [id number]       Pt.'s Ins. will not pay for the OTC Prilosec but it will pay for the prescription Prilosec. Wants to know if it can be prescribed?  Initial call taken by: Karna Christmas,  September 28, 2009 4:25 PM  Rx sent to pharmacy and prilosec OTC d/c'ed. Hortense Ramal CMA Duncan Dull)  September 28, 2009 4:36 PM

## 2010-10-04 NOTE — Letter (Signed)
Summary: [facility]   Imported By: Lester Renningers 11/19/2009 10:47:08  _____________________________________________________________________  External Attachment:    Type:   Image     Comment:   External Document

## 2010-10-04 NOTE — Miscellaneous (Signed)
Summary: clotest  Clinical Lists Changes  Orders: Added new Test order of TLB-H Pylori Screen Gastric Biopsy (83013-CLOTEST) - Signed 

## 2010-10-04 NOTE — Consult Note (Signed)
Summary: [facility]   Imported By: Lester [place] 10/22/2009 10:14:32  _____________________________________________________________________  External Attachment:    Type:   Image     Comment:   External Document

## 2010-10-04 NOTE — Progress Notes (Signed)
Summary: Can she take her Prilosec-has Endo 09-28-09. Also needs a refill   Phone Note Call from Patient Call back at Home Phone [phone] Call back at or [phone] cell    Call For: Dr Juanda Chance Summary of Call: Can she still take her Zegrid & Prilosec until her Endo 09-28-09. What about Hydrocodone. Initial call taken by: Leanor Kail [facility],  September 20, 2009 11:52 AM  Follow-up for Phone Call        advised patient its ok to take all of her medicaition before her procedure. She asked for refills of her meds I refilled her bentyl and levsin, but advised her that she would have to talk to Dr. Juanda Chance about getting her Xanax and pain meds refilled when she comes for her procedure.  Follow-up by: Harlow Mares CMA Duncan Dull),  September 20, 2009 3:33 PM    Prescriptions: DICYCLOMINE HCL 20 MG TABS (DICYCLOMINE HCL) as needed  #120 x 1   Entered by:   Harlow Mares CMA (AAMA)   Authorized by:   Hart Carwin MD   Signed by:   Harlow Mares CMA (AAMA) on 09/20/2009   Method used:   Electronically to        [facility] (retail)       [address]       [place], [place]  [address]       Ph: [phone] or [phone]       Fax: [phone]   RxID:   [id number] LEVSIN/SL 0.125 MG SUBL (HYOSCYAMINE SULFATE) Put one under the tongue evey 4-6 hours as needed for cramping and spasms  #60 x 2   Entered by:   Harlow Mares CMA (AAMA)   Authorized by:   Hart Carwin MD   Signed by:   Harlow Mares CMA (AAMA) on 09/20/2009   Method used:   Electronically to        [facility] (retail)       [address]       [place], [place]  [address]       Ph: [phone] or [phone]       Fax: [phone]   RxID:   [id number]

## 2010-10-04 NOTE — Letter (Signed)
Summary: Patient [facility] Biopsy Results  [facility]  [address] [place], [place] [address]   Phone: [phone]  Fax: [phone]        September 30, 2009 MRN: [id number]    Alicia Owens [address] [place], [place]  [address]    Dear Ms. Shinn,  I am pleased to inform you that the biopsies taken during your recent endoscopic examination did not show any evidence of cancer upon pathologic examination.The test for H.Pylori infection was negative.  Additional information/recommendations:  __No further action is needed at this time.  Please follow-up with      your primary care physician for your other healthcare needs.  __ Please call [phone] to schedule a return visit to review      your condition.  x__ Continue with the treatment plan as outlined on the day of your      exam.  _   Please call us if you are having persistent problems or have questions about your condition that have not been fully answered at this time.  Sincerely,  Hart Carwin MD  This letter has been electronically signed by your physician.  Appended Document: Patient Notice-Endo Biopsy Results Letter mailed 1.28.11

## 2010-11-11 ENCOUNTER — Encounter: Payer: Self-pay | Admitting: Internal Medicine

## 2010-11-15 NOTE — Miscellaneous (Signed)
Summary: Refills  I have spoken to Dr Juanda Chance. Although patient has been to see Dr Council Mechanic @ [facility] since seeing [place], she is okay with [place] filling prescriptions and seeing her back in the office. Dr Alm Bustard changed patients prescription for dicyclomine to 20 mg at bedtime and omeprazole 20 mg two times a day. Dottie Nelson-Smith CMA [facility] Dull)  November 11, 2010 5:21 PM   Clinical Lists Changes  Medications: Changed medication from DICYCLOMINE HCL 20 MG TABS (DICYCLOMINE HCL) as needed to DICYCLOMINE HCL 20 MG TABS (DICYCLOMINE HCL) Take 1 tablet by mouth at bedtime - Signed Changed medication from PRILOSEC 20 MG CPDR (OMEPRAZOLE) Take 1 tablet by mouth two times a day to PRILOSEC 20 MG CPDR (OMEPRAZOLE) Take 1 tablet by mouth two times a day - Signed Rx of DICYCLOMINE HCL 20 MG TABS (DICYCLOMINE HCL) Take 1 tablet by mouth at bedtime;  #30 x 1;  Signed;  Entered by: Lamona Curl CMA (AAMA);  Authorized by: Hart Carwin MD;  Method used: Electronically to [facility]*, [address], [place], [place], [place]  [address], Ph: [phone] or [phone], Fax: [phone] Rx of PRILOSEC 20 MG CPDR (OMEPRAZOLE) Take 1 tablet by mouth two times a day;  #60 x 1;  Signed;  Entered by: Lamona Curl CMA (AAMA);  Authorized by: Hart Carwin MD;  Method used: Electronically to [facility]*, [address] [place], [place], [place]  [address], Ph: [phone] or [phone], Fax: [phone]    Prescriptions: PRILOSEC 20 MG CPDR (OMEPRAZOLE) Take 1 tablet by mouth two times a day  #60 x 1   Entered by:   Lamona Curl CMA (AAMA)   Authorized by:   Hart Carwin MD   Signed by:   Lamona Curl CMA (AAMA) on 11/11/2010   Method used:   Electronically to        [facility] (retail)       [address]       [place], [place]  [address]       Ph: [phone] or [phone]       Fax: [phone]   RxID:    [id number] DICYCLOMINE HCL 20 MG TABS (DICYCLOMINE HCL) Take 1 tablet by mouth at bedtime  #30 x 1   Entered by:   Lamona Curl CMA (AAMA)   Authorized by:   Hart Carwin MD   Signed by:   Lamona Curl CMA (AAMA) on 11/11/2010   Method used:   Electronically to        [facility] (retail)       [address]       [place], [place]  [address]       Ph: [phone] or [phone]       Fax: [phone]   RxID:   [id number]

## 2010-11-29 ENCOUNTER — Other Ambulatory Visit (HOSPITAL_COMMUNITY): Payer: Self-pay | Admitting: Family Medicine

## 2010-11-29 ENCOUNTER — Ambulatory Visit (HOSPITAL_COMMUNITY)
Admission: RE | Admit: 2010-11-29 | Discharge: 2010-11-29 | Disposition: A | Discharge status: Home / Self Care | Payer: BC Managed Care – PPO | Source: Ambulatory Visit | Attending: Family Medicine | Admitting: Family Medicine

## 2010-11-29 DIAGNOSIS — R609 Edema, unspecified: Secondary | ICD-10-CM

## 2010-11-29 DIAGNOSIS — M7989 Other specified soft tissue disorders: Secondary | ICD-10-CM | POA: Insufficient documentation

## 2010-11-29 DIAGNOSIS — M79609 Pain in unspecified limb: Secondary | ICD-10-CM | POA: Insufficient documentation

## 2010-12-06 LAB — CBC
HCT: 37 % (ref 36.0–46.0)
Hemoglobin: 12.4 g/dL (ref 12.0–15.0)
MCV: 92.3 fL (ref 78.0–100.0)
RDW: 12.6 % (ref 11.5–15.5)

## 2010-12-06 LAB — DIFFERENTIAL
Eosinophils Absolute: 0 10*3/uL (ref 0.0–0.7)
Eosinophils Relative: 1 % (ref 0–5)
Lymphs Abs: 1.7 10*3/uL (ref 0.7–4.0)

## 2010-12-06 LAB — COMPREHENSIVE METABOLIC PANEL
ALT: 13 U/L (ref 0–35)
AST: 15 U/L (ref 0–37)
CO2: 22 mEq/L (ref 19–32)
Calcium: 9.1 mg/dL (ref 8.4–10.5)
Chloride: 111 mEq/L (ref 96–112)
GFR calc Af Amer: >60 mL/min (ref >60)
GFR calc non Af Amer: >60 mL/min (ref >60)
Potassium: 3.6 mEq/L (ref 3.5–5.1)
Sodium: 140 mEq/L (ref 135–145)

## 2010-12-06 LAB — URINALYSIS, ROUTINE W REFLEX MICROSCOPIC
Glucose, UA: NEGATIVE mg/dL
Hgb urine dipstick: NEGATIVE
Ketones, ur: NEGATIVE mg/dL
Protein, ur: NEGATIVE mg/dL
pH: 6.5 (ref 5.0–8.0)

## 2010-12-06 LAB — LIPASE, BLOOD: Lipase: 25 U/L (ref 11–59)

## 2011-01-20 NOTE — Op Note (Signed)
NAMEFAYELYNN, Alicia Owens                ACCOUNT NO.:  [id number]   MEDICAL RECORD NO.:  [id number]          PATIENT TYPE:  AMB   LOCATION:  DAY                           FACILITY:  [facility]   PHYSICIAN:  Lionel December, M.D.    DATE OF BIRTH:  1976-05-27   DATE OF PROCEDURE:  11/12/2006  DATE OF DISCHARGE:                               OPERATIVE REPORT   PROCEDURE:  Esophagogastroduodenoscopy with placement of Bravo device  for pH study.   INDICATIONS:  Aamira is a 35 year old Caucasian female with over a five-  year history of GERD whose heartburn is a fairly well controlled with  Zegerid, but she has intermittent chest pain.  She is undergoing  diagnostic EGD.  If esophagus is normal, Bravo device will be placed for  pH study.  The procedure risks were reviewed the patient, and informed  consent was obtained.   Meds for conscious sedation, benzocaine spray for pharyngeal topical  anesthesia, Demerol 50 mg IV, Versed 12 mg IV in divided dose.  Please  note, the patient was also given Zofran 4 mg at the end of the  procedure.   FINDINGS:  The procedure performed in endoscopy suite.  The patient's  vital signs and O2 saturations were monitored during the procedure and  remained stable.  The patient was placed in the left lateral recumbent  position, and the Pentax videoscope was passed via the oropharynx  without any difficulty into the esophagus.   Esophagus:  The mucosa of the esophagus was normal.  The GE junction was  at 36 cm from the incisors and was unremarkable.  Pictures taken for the  record.   Stomach:  It was empty and distended very well with insufflation.  The  folds of the proximal stomach were normal.  Examination of the mucosa  revealed patchy erythema and granularity at antrum, but no erosions or  ulcers were noted.  The pyloric channel was patent.  The angularis,  fundus and cardia were examined by retroflexing the scope and were  normal.   Duodenum:  The bulbar  mucosa was normal.  The scope was passed into the  second part of duodenum where the mucosa and folds were normal.  The  endoscope was withdrawn.   Since the esophageal mucosa was normal, we proceeded with placement of  Bravo device.  The Bravo device was calibrated.  It was already loaded  onto the delivery system.  The delivery catheter was passed blindly via  oropharynx through the esophagus to a length of 30 cm from the incisors.  It was connected to a suction device for 30 seconds.  The device was  secured to the esophageal mucosa by pushing the plunger.  It was turned  clockwise and withdrawn.  The endoscope was passed again, and the Bravo  device was well- connected to the esophageal mucosa.  Pictures taken for  the record.  The endoscope was withdrawn.  The patient tolerated the  procedure well.   FINAL DIAGNOSIS:  1. Normal-appearing esophageal mucosa without evidence of erosive      esophagitis or Barrett's.  2. Bravo device placed 6 cm proximal to gastroesophageal junction.  3. Nonerosive antral gastritis.   RECOMMENDATIONS:  1. She will continue antireflux measures and Zegerid as before.  2. H pylori serology will be checked.  3. The patient was instructed regarding symptom diary.  She will      return to this facility in 48 hours.      Lionel December, M.D.  Electronically Signed     NR/MEDQ  D:  11/12/2006  T:  11/12/2006  Job:  [id number]   cc:   Lorin Picket A. Gerda Diss, MD  Fax: [phone]

## 2011-01-20 NOTE — H&P (Signed)
Alicia Owens, DEFIBAUGH NO.:  [id number]   MEDICAL RECORD NO.:  [id number]          PATIENT TYPE:  INP   LOCATION:  9174                          FACILITY:  [facility]   PHYSICIAN:  Richardean Sale, M.D.   DATE OF BIRTH:  Jan 26, 1976   DATE OF ADMISSION:  01/08/2006  DATE OF DISCHARGE:                                HISTORY & PHYSICAL   ADMISSION DIAGNOSIS:  38+-week intrauterine pregnancy with gestational  hypertension and headache.   HISTORY OF PRESENT ILLNESS:  This is a 35 year old gravida 1, para 0 white  female who is at 38+ weeks gestation with a due date of Jan 22, 2006 by  first trimester ultrasound who has developed hypertension in the last three  weeks of her pregnancy.  The patient's blood pressures have ranged from the  130s to 140s/80s to 100s, and she has been on home bedrest for the past two  weeks.  Today, she presented to the office for a followup visit.  She  complained of a headache.  She denied any visual changes.  She does admit to  having some nausea but no vomiting.  Blood pressure here is 130/98.  She  reports decreased fetal movement.  NST in the office in nonreactive.  The  patient is being admitted today for induction of labor for hypertension.   PRENATAL CARE:  [facility].  Dr. Richardean Sale is the primary  attending.  Pregnancy has otherwise been uncomplicated.   PAST MEDICAL HISTORY:  1.  Migraines.  2.  Reflux.   PAST SURGICAL HISTORY:  1.  Wisdom teeth.  2.  Breast reduction.   PAST GYNECOLOGIC HISTORY:  Infertility.  No history of abnormal Pap smears  or sexually transmitted infections.   PAST OBSTETRICAL HISTORY:  Gravida 1, para 0.  This pregnancy was achieved  with Clomid.   FAMILY HISTORY:  Chronic hypertension in both parents.  Great-grandmother  with breast cancer.  No birth defects, congenital anomalies, Down syndrome,  spina bifida, mental retardation, cystic fibrosis, or other inherited  illnesses.   MEDICATIONS:  Prenatal vitamins.   ALLERGIES:  CODEINE CAUSES NAUSEA AND VOMITING, AND SULFA CAUSES A RASH.   PHYSICAL EXAMINATION:  VITAL SIGNS:  She is afebrile.  Blood pressure is  130/90 and 130/98.  Fetal heart tones in the 150s.  NST nonreactive.  GENERAL:  She is a well-developed, well-nourished white female who appears  in no acute distress.  HEART:  Regular rate and rhythm.  LUNGS:  Clear to auscultation bilaterally.  ABDOMEN:  Gravid, soft, nontender.  Fundal height is appropriate.  EXTREMITIES:  1+ edema in the feet and hands bilaterally.  Deep tendon  reflexes are 2+ bilaterally with no clonus.  PELVIC:  Cervix is closed, 50% effaced, -3 station, vertex.   PRENATAL LABORATORY DATA:  First trimester hemoglobin 12.6, platelet count  354.  Blood type A positive.  Antibody screen negative.  Toxoplasmosis titer  negative.  RPR nonreactive.  Rubella immune.  Hepatitis B surface antigen  nonreactive.  HIV nonreactive.  Pap smear within normal limits.  Gonorrhea  and chlamydia screen  negative.  One-hour Glucola 116.  Group B beta strep  positive.   ASSESSMENT:  35 year old gravida 1, para 0 white female at [redacted] weeks  gestation with gestational hypertension, headache.   PLAN:  1.  Will admit to labor and delivery.  2.  Will start Cervidil for induction followed by Pitocin and amniotomy when      able.  3.  Check preeclampsia labs on admission.  No protein noted on urine today      in the office.  4.  Monitor blood pressure closely.  5.  Continuous fetal monitoring.  6.  Discuss with patient indications, risks, benefits, and alternatives to      induction.  We discussed the slightly increased risk of cesarean      delivery given her nulliparous status and unfavorable cervix.  The      patient voiced understanding of all of the above and desires to proceed.      Informed consent has been obtained.      Richardean Sale, M.D.  Electronically Signed     JW/MEDQ  D:   01/08/2006  T:  01/09/2006  Job:  [id number]

## 2011-01-20 NOTE — Op Note (Signed)
[name], Owens NO.:  [id number]   MEDICAL RECORD NO.:  [id number]          PATIENT TYPE:  INP   LOCATION:  9374                          FACILITY:  [facility]   PHYSICIAN:  Richardean Sale, M.D.   DATE OF BIRTH:  01/12/1976   DATE OF PROCEDURE:  01/10/2006  DATE OF DISCHARGE:                                 OPERATIVE REPORT   PREOPERATIVE DIAGNOSIS:  38+ week uterine pregnancy induced for preeclampsia  with failure to progress and arrest of dilatation.   POSTOPERATIVE DIAGNOSIS:  38+ week uterine pregnancy induced for  preeclampsia with failure to progress and arrest of dilatation.   PROCEDURE:  Primary low transverse cesarean section.   SURGEON:  Richardean Sale, M.D.   ASSISTANT:  None.   COMPLICATIONS:  None.   ANESTHESIA:  Spinal   ESTIMATED BLOOD LOSS:  600 mL.   URINE OUTPUT:  Clear.   FINDINGS:  Viable female infant, cephalic presentation, Apgars of eight and  nine.  Clear amniotic fluid, intact placenta with three-vessel cord.  Arterial cord pH 7.4, normal adnexa.  Normal uterus.   SPECIMENS:  None.   INDICATIONS:  This is a 35 year old gravida 1, para 0 white female who was  admitted to 38+ weeks' gestation with elevated blood pressures, persistent  nausea and underwent attempt at induction of labor for preeclampsia.  The  patient underwent Cervidil x2 doses followed by administration of vaginal  Cytotec and then Pitocin.  The patient despite 40 hours of attempted  induction did not make any cervical change, ultimately was on Pitocin 20  milliunits and contracting every 2 to 3 minutes. Throughout the course of  her labor the patient complained of worsening epigastric pain and nausea  that was not relieved with any antiemetics. In addition, she developed  blurry vision and headache.  She was started on magnesium sulfate for  seizure prophylaxis. Blood pressures remained in the 130's to 140's over  80's to 90's. Fetal heart rate tracing was  reassuring throughout. Serial  preeclampsia labs were drawn and only abnormal value was uric acid high at  6.5.  Liver function studies, LDH, platelet count were all normal.  Given  the patient's worsening symptoms and failure to dilate, I made the  recommendation to proceed with cesarean delivery. Prior to procedure the  risks, benefits and alternatives of the procedure reviewed with the patient  and husband in detail. We discussed the risks which include but not limited  to hemorrhage requiring transfusion, infection, injury to the bowel or  bladder or other organs which could require additional surgery at this time  of this procedure or in the future and other anesthesia related risks. The  patient and husband voiced understanding of above and desired to proceed.  Informed consent was obtained before proceeding to the OR.   PROCEDURE:  The patient was taken to the operating room where she was given  a spinal anesthetic.  Immediately after spinal was administered, the  patient's IV which was in her right hand was no longer functioning. Multiple  attempts were made by both the anesthesiologist and  the anesthetist to  obtain peripheral IV access but were unsuccessful. An attempt was made by  the anesthesiologist to place a line in her right IJ but was also  unsuccessful.  Ultimately a peripheral line was established in her right  upper extremity.  Once this was confirmed to be functioning normally I  proceeded to prep the abdomen in the usual sterile fashion with Betadine. A  Foley catheter was placed. The patient's abdomen was tested. Anesthesia was  found to be adequate. 10 mL of 0.25% plain Marcaine were injected into the  area where the incision was to be made and a Pfannenstiel incision was then  made with the scalpel. This was carried down sharply to the fascia.  The  fascia was incised in midline and the fascial incision was then extended  laterally with the Mayo scissors.  The  superior inferior aspects of the  fascial incision were grasped with Kocher clamps, elevated and the  underlying rectus muscles were dissected off with both sharp and blunt  dissection. The muscles were then separated midline.  The peritoneum was  identified and entered. This incision was then extended superiorly and  inferiorly with good visualization of bladder.  The bladder blade was  inserted and the vesicouterine peritoneum was grasped with pickups and  entered sharply with Metzenbaum scissors.  This incision was then extended  laterally and the bladder flap was created digitally.   The bladder blade was then reinserted and the lower uterine segment was  incised in transverse fashion with scalpel.  This incision was then extended  laterally. Amniotomy was performed.  The fluid was clear and hand was then  inserted into the incision and the infant's head was delivered  atraumatically.  Nose and mouth were suctioned with bulb.  A loose nuchal  cord x1 was reduced. The infant was then delivered in a sterile field with a  vigorous cry.  The cord was clamped and cut and the infant was handed off to  the waiting NICU attendants.  Cord blood, cord gas was obtained.  Arterial  pH was 7.4 Apgars were 8 and 9. At this point the placenta was then removed  and intact and sent to labor and delivery.  The uterus was cleared of all  clot debris.  The uterus was exteriorized and adnexa were visualized and  were normal.  The uterine incision was then repaired with running chromic in  a running locked fashion.  Second layer of the same suture was placed for  additional hemostasis and in an imbricating fashion in the event that the  patient would attempt a vaginal delivery in the future. Once the incision  was hemostatic.  The uterus was then returned to the abdomen.  The pelvis  was irrigated copiously with normal saline and peritoneal surfaces were inspected and were hemostatic.  The peritoneum was  then closed with a  running chromic suture. The subfascial areas were inspected were hemostatic.  Any areas of bleeding were cauterized with Bovie.  The fascial incision was  then repaired with running Vicryl suture. Subcutaneous space was irrigated  and any areas of bleeding were cauterized Bovie and the skin was closed with  staples.   The patient tolerated the procedure very well.  Sponge, lap, needle and  instrument counts were correct x2.  The patient was taken to recovery room  awake in stable condition.  She will be continued on magnesium sulfate  postoperatively for at least 24 hours. Infant and mother  are doing well at  the time of this dictation.      Richardean Sale, M.D.  Electronically Signed     JW/MEDQ  D:  01/10/2006  T:  01/10/2006  Job:  [id number]

## 2011-01-20 NOTE — Consult Note (Signed)
Alicia Owens, DRUMWRIGHT NO.:  [id number]   MEDICAL RECORD NO.:  [id number]          PATIENT TYPE:  MAT   LOCATION:  MATC                          FACILITY:  [facility]   PHYSICIAN:  Lenoard Aden, M.D.DATE OF BIRTH:  1975/11/03   DATE OF CONSULTATION:  12/27/2005  DATE OF DISCHARGE:                                   CONSULTATION   CHIEF COMPLAINT:  Rule out preeclampsia.   She is a 35 year old white female G1, P0, EDD of Jan 12, 2006 at 37+ weeks  gestation with elevated blood pressure and mild headache in the office  today.  She has allergies to CODEINE.  Medications are prenatal vitamins.  She has an allergy to SULFA as well.  She has a history of reflux on  Prevacid.  She has a history of migraine headaches.  She has a history of  wisdom tooth removal and breast reduction.  Family history of breast cancer  and hypertension.  She is a nonsmoker, nondrinker.  Denies domestic or  physical violence.  Blood type A+.  Rh antibody negative.  Rubella immune.  Hepatitis and HIV nonreactive.   PHYSICAL EXAMINATION:  GENERAL:  She is a well-developed, well-nourished  white female in no acute distress.  VITAL SIGNS:  Blood pressure now 120s/60s.  HEENT:  Normal.  LUNGS:  Clear.  HEART:  Regular rate and rhythm.  ABDOMEN:  Soft, gravid, nontender.  PELVIC:  Deferred.  EXTREMITIES:  No cords.  NEUROLOGIC:  Nonfocal.   Uric acid 5.8.  LDH 118.  CBC within normal limits with a platelet count of  321.  SGOT/SGPT within normal limits.  Creatinine is 0.6.  24-hour urine to  be dispensed for home use.   IMPRESSION:  1.  37-week OB.  2.  Elevated blood pressure with no evidence of pregnancy induced      hypertension or preeclampsia.   PLAN:  Check 24-hour urine.  PIH precautions given.  Follow up in the office  within three to four days.      Lenoard Aden, M.D.  Electronically Signed     RJT/MEDQ  D:  12/27/2005  T:  12/27/2005  Job:  [id number]

## 2011-01-20 NOTE — Op Note (Signed)
Alicia Owens, Alicia Owens                ACCOUNT NO.:  [id number]   MEDICAL RECORD NO.:  [id number]          PATIENT TYPE:  AMB   LOCATION:  DAY                           FACILITY:  [facility]   PHYSICIAN:  Lionel December, M.D.    DATE OF BIRTH:  12-02-1975   DATE OF PROCEDURE:  11/14/2006  DATE OF DISCHARGE:  11/12/2006                               OPERATIVE REPORT   PROCEDURES:  Esophageal pH study with a Bravo device.   INDICATION:  Alicia Owens is a 35 year old Caucasian female with over 5-year  history of GERD whose heartburn is well-controlled with a PPI but she  continues to have intermittent chest pain.  We discussed she had normal  appearing esophagus on Bravo device will be placed for pH study.  She is  agreeable.   Please note that this study is done on a PPI.   FINDINGS:   DAY ONE ANALYSIS:  Study duration 21 hours and 53 minutes.  Number of  reflux episodes; 12 all of which occurred in upright position.   Number of reflux episodes greater than 5 minutes at 0.  Duration of longest reflux episode 1 minute, next time pH below 4-7  minutes.  Fraction time pH below 4 to 0.5 minutes.   DAY TWO ANALYSIS:  1. Study duration 23 hours and 24 minutes.  2. Number of reflux episodes 14 all of which occurred in upright      position.   Number of reflux episodes greater than 5 minutes is 0, duration of  longest reflux episodes 1 minute, next time pH below 4 is 4 minutes,  fraction time pH below 4 is 0.3.   COMBINED A 2-DAY ANALYSIS:  1. Duration of study is 45 hours and 17 minutes.  2. Number of reflux episodes 26 all of which occurred in upright      position.  3. Duration of longest reflux episode is 1 minute.  4. Time pH below 4 is 11 minutes.  5. Fraction time pH below 4 is 0.4%.   SYMPTOM DIARY:  The patient reported one episode of heartburn and acid  was documented in her esophagus, corresponding to this.   The patient reported 15 episodes of chest pain and acid was documented  in  her esophagus only with three of these episodes which is a symptom  correlation of 20%.   ASSESSMENT:  This is normal esophageal pH study on PPI.   The patient had 26 episodes of reflux all of which occurred in upright  position and lasted for a few seconds to no longer than a minute.   She reported one episode of heartburn and acid was documented in  esophagus which means a symptom correlation of 100%.   She reported 15 episodes of chest pain and acid was documented with  three of these which is a symptom correlation of 20% which would be  considered poor.   RECOMMENDATIONS:  She will continue antireflux measures and PPI.   If her chest pain remains or frequent symptoms, would consider  esophageal manometry to rule out nutcracker esophagus or diffuse  esophageal  spasm.      Lionel December, M.D.  Electronically Signed     NR/MEDQ  D:  11/25/2006  T:  11/25/2006  Job:  [id number]   cc:   Lorin Picket A. Gerda Diss, MD  Fax: [phone]

## 2011-01-20 NOTE — Discharge Summary (Signed)
Alicia Owens, Alicia Owens                ACCOUNT NO.:  [id number]   MEDICAL RECORD NO.:  [id number]          PATIENT TYPE:  INP   LOCATION:  9101                          FACILITY:  [facility]   PHYSICIAN:  Richardean Sale, M.D.   DATE OF BIRTH:  1976/07/10   DATE OF ADMISSION:  01/08/2006  DATE OF DISCHARGE:  01/13/2006                                 DISCHARGE SUMMARY   ADMITTING DIAGNOSES:  A 38+ week intrauterine pregnancy with preeclampsia  for induction of labor.   POSTOPERATIVE DIAGNOSES:  1.  A 38+ week intrauterine pregnancy with preeclampsia for induction of      labor.  2.  Status post primary cesarean delivery.   PROCEDURES:  Primary low transverse cesarean section performed on Jan 10, 2006 resulting in delivery of a viable female infant with Apgar's of eight  and nine, arterial cord pH of 7.4, intact placenta with three-vessel cord.   HOSPITAL COURSE/HISTORY OF PRESENT ILLNESS:  Please see admission history  physical for details.  Briefly, this is a 35 year old, gravida 1, para 0,  white female who was admitted at 38+ weeks' gestation with persistent  elevated blood pressures and headache.  The patient underwent an attempted  induction of labor with cervical ripening agents followed by Pitocin and  made no progress in labor.  She received antibiotic prophylaxis for group B  beta strep.  During her labor, the patient began to complain of a headache  as well as chest tightening.  Given these findings, she was started on  magnesium sulfate.  There was no progress in labor and a primary cesarean  section was performed on Jan 10, 2006 resulting in delivery of a viable  female infant.  Cesarean section was complicated by loss of intravenous  access.  The anesthesiologist attempted to place a central line but was  unsuccessful.  Ultimately, peripheral access was obtained prior to the start  of the procedure.   Postoperatively, the patient remained on magnesium sulfate until adequate  diuresis occurred.  The patient continued to have epigastric pain with no  significant change in her liver function tests or pancreatic enzymes.  The  patient underwent an upper abdominal ultrasound which revealed no acute  findings.  The patient was subsequently discharged to home on postoperative  day #3 in improved condition.  She was normotensive at the time of discharge  and was not discharged to home on any hypertensive medications.   LABORATORY STUDIES:  Hemoglobin nadir at 8.8, hematocrit 25.9, platelets  281, white count 17.2.  ALT and AST remained within normal limits.  LDH  remained normal.  Uric acid elevated at 6.5.  Ultrasound findings a normal  gallbladder, no hepatic, pancreatic or splenic abnormality.  No renal  abnormality.   DISPOSITION:  To home.   CONDITION:  Improved.   FOLLOW UP:  The patient will follow up within the week for a blood pressure  check.  Prior to discharge, signs and symptoms of preeclampsia including  headache, visual changes, epigastric pain were removed.  The patient is to  call if any of her symptoms recur.  MEDICATIONS:  Ibuprofen 800 mg p.o. every eight hours p.r.n., Darvocet 1  tablet every four hours p.r.n. pain and Nexium 40 mg daily and prenatal  vitamins daily.   FOLLOW UP:  The patient will follow up in the office within one week for  blood pressure check and again at four weeks for postpartum visit.      Richardean Sale, M.D.  Electronically Signed     JW/MEDQ  D:  02/22/2006  T:  02/23/2006  Job:  [id number]

## 2011-01-20 NOTE — Consult Note (Signed)
Alicia Owens, Alicia Owens                ACCOUNT NO.:  [id number]   MEDICAL RECORD NO.:  [id number]          PATIENT TYPE:  AMB   LOCATION:                                FACILITY:  [facility]   PHYSICIAN:  Lionel December, M.D.    DATE OF BIRTH:  1976/08/29   DATE OF CONSULTATION:  10/24/2006  DATE OF DISCHARGE:                                 CONSULTATION   REASON FOR CONSULTATION:  Chest pain, acid reflux.   HISTORY OF PRESENT ILLNESS:  The patient is a 35 year old Caucasian  female with 4-5 year history of gastroesophageal reflux disease. Over  the last 7-8 months she has developed atypical chest pain which is  described as a chest pressure in the mid sternal region at times  associated with shortness of breath or at least the sensation of  difficulty breathing. She has had some nausea but no vomiting. She  noticed these symptoms began escalating towards the end of her  pregnancy. She delivered a healthy baby girl in May 2007. However, after  delivery the symptoms only continued. She was on Nexium for several  years, but had to switch to Prilosec OTC because of insurance coverage.  She has been on Zegerid for about six months on a daily basis, but has  not really noticed any improvement of her chest pain. She denies any  correlation with meals. At times the pain has awakened her at night.  Currently her typical reflux symptoms are well-controlled. She denies  any abdominal pain. She has noticed some difficulty swallowing pills but  not her food. She has lifelong diarrhea consistent with 2-3 loose stools  a day. Denies any nocturnal diarrhea, melena or rectal bleeding. She has  been seen by a cardiologist. She states she has had a negative stress  test, echocardiogram. She also had negative hepatobiliary scan with  gallbladder ejection fraction of 66%. CT of chest angio May 2007 was  negative. Abdominal ultrasound May 2007 was unremarkable. She never had  an EGD.   CURRENT MEDICATIONS:  1.  Zegerid 40 mg daily.  2. Atenolol 50 mg daily.  3. Imitrex p.r.n.  4. Phenergan 25 mg p.r.n.  5. Multivitamin daily.  6. Vitamin C daily.  7. Fiber daily.  8. Vitamin B12 daily.  9. Magnesium daily.  10.Fish oil daily.  11.Ibuprofen p.r.n.  12.Tylenol p.r.n.  13.Imodium p.r.n.   ALLERGIES:  CODEINE, SULFA.   PAST MEDICAL HISTORY:  Migraine headaches, hypertension. She had mild  hyperthyroidism postpartum and had been following with frequent labs.  She has had wisdom teeth extraction, breast reduction, Cesarean section,  and recently had an IUD placed.   FAMILY HISTORY:  Negative for colorectal cancer, chronic GI illnesses.   SOCIAL HISTORY:  She is married. She has a daughter who was born in May  2007. She is employed with [employer]. She has never been a  smoker. No alcohol use.   REVIEW OF SYSTEMS:  See HPI for GI. See HPI for CARDIOPULMONARY. She  denies any diaphoresis, palpitations. GU:  She has regular menses. She  had IUD placed. Denies  any dysuria, hematuria.   PHYSICAL EXAMINATION:  VITAL SIGNS:  Weight 162, height 5 feet 1-1/2  inches, temp 98.2, blood pressure 110/78, pulse 76.  GENERAL:  Pleasant, well-nourished, well-developed Caucasian female in  no acute distress.  SKIN:  Warm and dry, no jaundice.  HEENT:  Sclerae anicteric, oropharyngeal mucosa moist and pink. No  lesions or erythema or exudate.  NECK:  No lymphadenopathy or thyromegaly.  CHEST:  Lungs clear to auscultation.  CARDIAC:  Reveals regular rate and rhythm. Normal S1, S2. No murmurs,  rubs or gallops.  ABDOMEN:  Positive bowel sounds, soft, nontender, nondistended. No  organomegaly or masses. No rebound tenderness or guarding. No abdominal  bruits or hernias.  EXTREMITIES:  No edema.   IMPRESSION:  Alicia Owens is a 35 year old lady with chronic gastroesophageal  reflux disease dating back four or five years. In the beginning she did  have some typical reflux associated with occasional  chest pain, but over  the last 7-8 months she has had more of a chest pain component. She  states her typical reflux symptoms are well-controlled. She reports a  negative cardiac workup. She has had negative chest CT. Gallbladder  workup has been negative. She may be having atypical chest pain related  to uncontrolled reflux.   PLAN:  Esophagogastroduodenoscopy plus/minus Bravo study in the near  future. She will continue Zegerid for now. I will be discussing with Dr.  Karilyn Cota whether or not he would like the study to be done off of PPI  therapy and make arrangements if need be. Further recommendations to  follow.   I would like to thank Dr. Lilyan Punt for allowing [place] to take part in  the care of this patient.      Tana Coast, P.A.      Lionel December, M.D.  Electronically Signed    LL/MEDQ  D:  10/24/2006  T:  10/24/2006  Job:  [id number]   cc:   Lorin Picket A. Gerda Diss, MD  Fax: [phone]

## 2011-02-01 ENCOUNTER — Other Ambulatory Visit: Payer: Self-pay | Admitting: Obstetrics and Gynecology

## 2011-06-08 ENCOUNTER — Emergency Department (HOSPITAL_COMMUNITY)
Admission: EM | Admit: 2011-06-08 | Discharge: 2011-06-08 | Disposition: A | Discharge status: Home / Self Care | Payer: BC Managed Care – PPO | Attending: Emergency Medicine | Admitting: Emergency Medicine

## 2011-06-08 DIAGNOSIS — R0609 Other forms of dyspnea: Secondary | ICD-10-CM | POA: Insufficient documentation

## 2011-06-08 DIAGNOSIS — R0789 Other chest pain: Secondary | ICD-10-CM | POA: Insufficient documentation

## 2011-06-08 DIAGNOSIS — F410 Panic disorder [episodic paroxysmal anxiety] without agoraphobia: Secondary | ICD-10-CM | POA: Insufficient documentation

## 2011-06-08 DIAGNOSIS — R0989 Other specified symptoms and signs involving the circulatory and respiratory systems: Secondary | ICD-10-CM | POA: Insufficient documentation

## 2011-06-08 DIAGNOSIS — K589 Irritable bowel syndrome without diarrhea: Secondary | ICD-10-CM | POA: Insufficient documentation

## 2011-08-14 ENCOUNTER — Encounter (HOSPITAL_COMMUNITY): Payer: Self-pay | Admitting: Pharmacist

## 2011-08-15 ENCOUNTER — Other Ambulatory Visit: Payer: Self-pay | Admitting: Obstetrics and Gynecology

## 2011-08-17 ENCOUNTER — Encounter (HOSPITAL_COMMUNITY): Payer: Self-pay

## 2011-08-18 ENCOUNTER — Other Ambulatory Visit: Payer: Self-pay | Admitting: Obstetrics and Gynecology

## 2011-08-18 ENCOUNTER — Encounter (HOSPITAL_COMMUNITY): Payer: Self-pay

## 2011-08-18 ENCOUNTER — Encounter (HOSPITAL_COMMUNITY)
Admission: RE | Admit: 2011-08-18 | Discharge: 2011-08-18 | Disposition: A | Discharge status: Home / Self Care | Payer: BC Managed Care – PPO | Source: Ambulatory Visit | Attending: Obstetrics and Gynecology | Admitting: Obstetrics and Gynecology

## 2011-08-18 HISTORY — DX: Headache: R51

## 2011-08-18 HISTORY — DX: Gastroparesis: K31.84

## 2011-08-18 HISTORY — DX: Gastro-esophageal reflux disease without esophagitis: K21.9

## 2011-08-18 HISTORY — DX: Thyrotoxicosis, unspecified without thyrotoxic crisis or storm: E05.90

## 2011-08-18 LAB — CBC
HCT: 35.4 % — ABNORMAL LOW (ref 36.0–46.0)
MCV: 91.2 fL (ref 78.0–100.0)
RBC: 3.88 MIL/uL (ref 3.87–5.11)
RDW: 13.6 % (ref 11.5–15.5)
WBC: 10 10*3/uL (ref 4.0–10.5)

## 2011-08-18 LAB — SURGICAL PCR SCREEN: Staphylococcus aureus: INVALID — AB

## 2011-08-18 NOTE — Patient Instructions (Signed)
YOUR PROCEDURE IS SCHEDULED ON:08/21/11  ENTER THROUGH THE MAIN ENTRANCE OF [facility] AT:10am  USE DESK PHONE AND DIAL [phone] TO INFORM us OF YOUR ARRIVAL  CALL [phone] IF YOU HAVE ANY QUESTIONS OR PROBLEMS PRIOR TO YOUR ARRIVAL.  REMEMBER: DO NOT EAT AFTER MIDNIGHT : Sunday  SPECIAL INSTRUCTIONS:clear liquids ok until 7:30 am Monday   YOU MAY BRUSH YOUR TEETH THE MORNING OF SURGERY   TAKE THESE MEDICINES THE DAY OF SURGERY WITH SIP OF WATER:Nexium   DO NOT WEAR JEWELRY, EYE MAKEUP, LIPSTICK OR DARK FINGERNAIL POLISH DO NOT WEAR LOTIONS OR DEODORANT DO NOT SHAVE FOR 48 HOURS PRIOR TO SURGERY  YOU WILL NOT BE ALLOWED TO DRIVE YOURSELF HOME.  NAME OF DRIVER:Todd

## 2011-08-20 LAB — MRSA CULTURE

## 2011-08-20 NOTE — H&P (Signed)
[name], [name] NO.:  [id number]  MEDICAL RECORD NO.:  [id number]  LOCATION:                                 FACILITY:  PHYSICIAN:  Lenoard Aden, M.D.DATE OF BIRTH:  1976/04/04  DATE OF ADMISSION: DATE OF DISCHARGE:                             HISTORY & PHYSICAL   CHIEF COMPLAINT:  Previous C-section for elective repeat.  HISTORY OF PRESENT ILLNESS:  The patient is a 35 year old white female, G2, P1, at [redacted] weeks gestation for elective repeat.  She has allergies to SULFA, PHENERGAN, and CODEINE DERIVATIVES.  She is a nonsmoker, nondrinker.  She denies domestic or physical violence.  She has a medical history remarkable for thyroid disease and gastroparesis.  MEDICATIONS:  Flexeril as needed, BuSpar as needed, Claritin as needed, Zofran as needed, Zantac p.r.n., and Nexium daily.  SURGICAL HISTORY:  Remarkable for breast reduction, previous C-section, appendectomy, and wisdom tooth removal.  First child was born in 2007 by primary C-section due to active phase arrest.  Prenatal course to date uncomplicated.  PHYSICAL EXAMINATION:  GENERAL:  She is a well-developed, well-nourished white female, in no acute distress. HEENT:  Normal. NECK:  Supple.  Full range of motion. LUNGS:  Clear. HEART:  Regular rhythm. ABDOMEN:  Soft, gravid, nontender.  Estimated fetal weight 7.5 pounds. Cervix is closed, 50%, vertex, -2. EXTREMITIES:  No cords. NEUROLOGIC:  Nonfocal. SKIN:  Intact.  IMPRESSION: 1. 39-week intrauterine pregnancy. 2. Previous cesarean for repeat. 3. Desire for elective sterilization.  PLAN:  Proceed with repeat low segment transverse cesarean section, tubal ligation.  Risks of anesthesia, infection, bleeding, injury to abdominal organs, need for repair was discussed.  Delayed versus immediate complications to include bowel and bladder injury noted. Failure risk of tubal ligation of 5-10%.  The patient acknowledges and wishes to  proceed.     Lenoard Aden, M.D.     RJT/MEDQ  D:  08/20/2011  T:  08/20/2011  Job:  [id number]

## 2011-08-21 ENCOUNTER — Encounter (HOSPITAL_COMMUNITY)
Admission: RE | Disposition: A | Discharge status: Home / Self Care | Payer: Self-pay | Source: Ambulatory Visit | Attending: Obstetrics and Gynecology

## 2011-08-21 ENCOUNTER — Encounter (HOSPITAL_COMMUNITY): Payer: Self-pay | Admitting: Anesthesiology

## 2011-08-21 ENCOUNTER — Inpatient Hospital Stay (HOSPITAL_COMMUNITY)
Admission: RE | Admit: 2011-08-21 | Discharge: 2011-08-23 | DRG: 371 | Disposition: A | Discharge status: Home / Self Care | Payer: BC Managed Care – PPO | Source: Ambulatory Visit | Attending: Obstetrics and Gynecology | Admitting: Obstetrics and Gynecology

## 2011-08-21 ENCOUNTER — Other Ambulatory Visit: Payer: Self-pay | Admitting: Obstetrics and Gynecology

## 2011-08-21 ENCOUNTER — Encounter (HOSPITAL_COMMUNITY): Payer: Self-pay | Admitting: *Deleted

## 2011-08-21 ENCOUNTER — Inpatient Hospital Stay (HOSPITAL_COMMUNITY): Payer: BC Managed Care – PPO | Admitting: Anesthesiology

## 2011-08-21 DIAGNOSIS — O34219 Maternal care for unspecified type scar from previous cesarean delivery: Principal | ICD-10-CM | POA: Diagnosis present

## 2011-08-21 DIAGNOSIS — Z01818 Encounter for other preprocedural examination: Secondary | ICD-10-CM

## 2011-08-21 DIAGNOSIS — O09529 Supervision of elderly multigravida, unspecified trimester: Secondary | ICD-10-CM | POA: Diagnosis present

## 2011-08-21 DIAGNOSIS — Z302 Encounter for sterilization: Secondary | ICD-10-CM

## 2011-08-21 DIAGNOSIS — Z01812 Encounter for preprocedural laboratory examination: Secondary | ICD-10-CM

## 2011-08-21 SURGERY — Surgical Case
Anesthesia: Spinal | Site: Abdomen | Laterality: Bilateral | Wound class: Clean Contaminated

## 2011-08-21 MED ORDER — FENTANYL CITRATE 0.05 MG/ML IJ SOLN
INTRAMUSCULAR | Status: DC | PRN
  Administered 2011-08-21: 15 ug via INTRATHECAL

## 2011-08-21 MED ORDER — SIMETHICONE 80 MG PO CHEW: 80.0000 mg | CHEWABLE_TABLET | ORAL | Status: DC | PRN

## 2011-08-21 MED ORDER — ONDANSETRON HCL 4 MG PO TABS: 4.0000 mg | ORAL_TABLET | ORAL | Status: DC | PRN

## 2011-08-21 MED ORDER — ONDANSETRON HCL 4 MG/2ML IJ SOLN: 4.0000 mg | INTRAMUSCULAR | Status: DC | PRN

## 2011-08-21 MED ORDER — PHENYLEPHRINE 40 MCG/ML (10ML) SYRINGE FOR IV PUSH (FOR BLOOD PRESSURE SUPPORT)
PREFILLED_SYRINGE | INTRAVENOUS | Status: AC
  Filled 2011-08-21: qty 15

## 2011-08-21 MED ORDER — METHYLERGONOVINE MALEATE 0.2 MG/ML IJ SOLN: 0.2000 mg | INTRAMUSCULAR | Status: DC | PRN

## 2011-08-21 MED ORDER — OXYTOCIN 20 UNITS IN LACTATED RINGERS INFUSION - SIMPLE
INTRAVENOUS | Status: AC
  Administered 2011-08-21: 20 [IU] via INTRAVENOUS
  Filled 2011-08-21: qty 1000

## 2011-08-21 MED ORDER — MORPHINE SULFATE 0.5 MG/ML IJ SOLN
INTRAMUSCULAR | Status: AC
  Filled 2011-08-21: qty 10

## 2011-08-21 MED ORDER — ONDANSETRON HCL 4 MG/2ML IJ SOLN
INTRAMUSCULAR | Status: DC | PRN
  Administered 2011-08-21: 4 mg via INTRAVENOUS

## 2011-08-21 MED ORDER — DIBUCAINE 1 % RE OINT: 1.0000 "application " | TOPICAL_OINTMENT | RECTAL | Status: DC | PRN

## 2011-08-21 MED ORDER — LANOLIN HYDROUS EX OINT: 1.0000 "application " | TOPICAL_OINTMENT | CUTANEOUS | Status: DC | PRN

## 2011-08-21 MED ORDER — SODIUM CHLORIDE 0.9 % IJ SOLN: 3.0000 mL | INTRAMUSCULAR | Status: DC | PRN

## 2011-08-21 MED ORDER — NALBUPHINE HCL 10 MG/ML IJ SOLN: 5.0000 mg | INTRAMUSCULAR | Status: DC | PRN

## 2011-08-21 MED ORDER — DIPHENHYDRAMINE HCL 50 MG/ML IJ SOLN: 12.5000 mg | INTRAMUSCULAR | Status: DC | PRN

## 2011-08-21 MED ORDER — SODIUM CHLORIDE 0.9 % IV SOLN: 1.0000 ug/kg/h | INTRAVENOUS | Status: DC | PRN

## 2011-08-21 MED ORDER — OXYTOCIN 20 UNITS IN LACTATED RINGERS INFUSION - SIMPLE
125.0000 mL/h | INTRAVENOUS | Status: AC
  Administered 2011-08-21: 20 [IU] via INTRAVENOUS

## 2011-08-21 MED ORDER — BUPIVACAINE HCL (PF) 0.25 % IJ SOLN
INTRAMUSCULAR | Status: DC | PRN
  Administered 2011-08-21: 30 mL

## 2011-08-21 MED ORDER — OXYCODONE-ACETAMINOPHEN 5-325 MG PO TABS: 1.0000 | ORAL_TABLET | ORAL | Status: DC | PRN

## 2011-08-21 MED ORDER — BUPIVACAINE IN DEXTROSE 0.75-8.25 % IT SOLN
INTRATHECAL | Status: DC | PRN
  Administered 2011-08-21: 11.75 mg via INTRATHECAL

## 2011-08-21 MED ORDER — ZOLPIDEM TARTRATE 5 MG PO TABS: 5.0000 mg | ORAL_TABLET | Freq: Every evening | ORAL | Status: DC | PRN

## 2011-08-21 MED ORDER — PANTOPRAZOLE SODIUM 40 MG PO TBEC
80.0000 mg | DELAYED_RELEASE_TABLET | Freq: Every day | ORAL | Status: DC
  Administered 2011-08-21: 80 mg via ORAL
  Filled 2011-08-21 (×4): qty 2

## 2011-08-21 MED ORDER — DIPHENHYDRAMINE HCL 25 MG PO CAPS: 25.0000 mg | ORAL_CAPSULE | Freq: Four times a day (QID) | ORAL | Status: DC | PRN

## 2011-08-21 MED ORDER — PHENYLEPHRINE HCL 10 MG/ML IJ SOLN
INTRAMUSCULAR | Status: DC | PRN
  Administered 2011-08-21 (×2): 80 ug via INTRAVENOUS
  Administered 2011-08-21: 40 ug via INTRAVENOUS

## 2011-08-21 MED ORDER — SIMETHICONE 80 MG PO CHEW
80.0000 mg | CHEWABLE_TABLET | Freq: Three times a day (TID) | ORAL | Status: DC
  Administered 2011-08-21 – 2011-08-22 (×4): 80 mg via ORAL

## 2011-08-21 MED ORDER — METHYLERGONOVINE MALEATE 0.2 MG PO TABS: 0.2000 mg | ORAL_TABLET | ORAL | Status: DC | PRN

## 2011-08-21 MED ORDER — IBUPROFEN 600 MG PO TABS
600.0000 mg | ORAL_TABLET | Freq: Four times a day (QID) | ORAL | Status: DC
  Administered 2011-08-21 – 2011-08-23 (×6): 600 mg via ORAL
  Filled 2011-08-21 (×2): qty 1

## 2011-08-21 MED ORDER — LACTATED RINGERS IV SOLN
INTRAVENOUS | Status: DC
  Administered 2011-08-21 (×4): via INTRAVENOUS

## 2011-08-21 MED ORDER — CEFAZOLIN SODIUM 1-5 GM-% IV SOLN
1.0000 g | INTRAVENOUS | Status: AC
  Administered 2011-08-21: 1 g via INTRAVENOUS

## 2011-08-21 MED ORDER — NALOXONE HCL 0.4 MG/ML IJ SOLN: 0.4000 mg | INTRAMUSCULAR | Status: DC | PRN

## 2011-08-21 MED ORDER — METOCLOPRAMIDE HCL 5 MG/ML IJ SOLN: 10.0000 mg | Freq: Three times a day (TID) | INTRAMUSCULAR | Status: DC | PRN

## 2011-08-21 MED ORDER — IBUPROFEN 600 MG PO TABS
600.0000 mg | ORAL_TABLET | Freq: Four times a day (QID) | ORAL | Status: DC | PRN
  Filled 2011-08-21 (×4): qty 1

## 2011-08-21 MED ORDER — KETOROLAC TROMETHAMINE 60 MG/2ML IM SOLN
INTRAMUSCULAR | Status: AC
  Filled 2011-08-21: qty 2

## 2011-08-21 MED ORDER — KETOROLAC TROMETHAMINE 30 MG/ML IJ SOLN: 30.0000 mg | Freq: Four times a day (QID) | INTRAMUSCULAR | Status: AC | PRN

## 2011-08-21 MED ORDER — SCOPOLAMINE 1 MG/3DAYS TD PT72
MEDICATED_PATCH | TRANSDERMAL | Status: AC
  Administered 2011-08-21: 1.5 mg via TRANSDERMAL
  Filled 2011-08-21: qty 1

## 2011-08-21 MED ORDER — KETOROLAC TROMETHAMINE 60 MG/2ML IM SOLN
60.0000 mg | Freq: Once | INTRAMUSCULAR | Status: AC | PRN
  Administered 2011-08-21: 60 mg via INTRAMUSCULAR

## 2011-08-21 MED ORDER — DIPHENHYDRAMINE HCL 25 MG PO CAPS
25.0000 mg | ORAL_CAPSULE | ORAL | Status: DC | PRN
  Administered 2011-08-22 (×4): 25 mg via ORAL
  Filled 2011-08-21 (×4): qty 1

## 2011-08-21 MED ORDER — ONDANSETRON HCL 4 MG/2ML IJ SOLN: 4.0000 mg | Freq: Once | INTRAMUSCULAR | Status: DC

## 2011-08-21 MED ORDER — SENNOSIDES-DOCUSATE SODIUM 8.6-50 MG PO TABS
2.0000 | ORAL_TABLET | Freq: Every day | ORAL | Status: DC
  Administered 2011-08-21: 2 via ORAL

## 2011-08-21 MED ORDER — LACTATED RINGERS IV SOLN
INTRAVENOUS | Status: DC
  Administered 2011-08-21: 22:00:00 via INTRAVENOUS

## 2011-08-21 MED ORDER — EPHEDRINE 5 MG/ML INJ
INTRAVENOUS | Status: AC
  Filled 2011-08-21: qty 10

## 2011-08-21 MED ORDER — MENTHOL 3 MG MT LOZG
1.0000 | LOZENGE | OROMUCOSAL | Status: DC | PRN
  Administered 2011-08-22: 3 mg via ORAL
  Filled 2011-08-21: qty 9

## 2011-08-21 MED ORDER — MEPERIDINE HCL 25 MG/ML IJ SOLN: 6.2500 mg | INTRAMUSCULAR | Status: DC | PRN

## 2011-08-21 MED ORDER — WITCH HAZEL-GLYCERIN EX PADS: 1.0000 "application " | MEDICATED_PAD | CUTANEOUS | Status: DC | PRN

## 2011-08-21 MED ORDER — FENTANYL CITRATE 0.05 MG/ML IJ SOLN
INTRAMUSCULAR | Status: AC
  Filled 2011-08-21: qty 2

## 2011-08-21 MED ORDER — SCOPOLAMINE 1 MG/3DAYS TD PT72: 1.0000 | MEDICATED_PATCH | Freq: Once | TRANSDERMAL | Status: DC

## 2011-08-21 MED ORDER — MORPHINE SULFATE (PF) 0.5 MG/ML IJ SOLN
INTRAMUSCULAR | Status: DC | PRN
  Administered 2011-08-21: .1 mg via INTRATHECAL

## 2011-08-21 MED ORDER — PRENATAL PLUS 27-1 MG PO TABS
1.0000 | ORAL_TABLET | Freq: Every day | ORAL | Status: DC
  Filled 2011-08-21 (×3): qty 1

## 2011-08-21 MED ORDER — TETANUS-DIPHTH-ACELL PERTUSSIS 5-2.5-18.5 LF-MCG/0.5 IM SUSP: 0.5000 mL | Freq: Once | INTRAMUSCULAR | Status: DC

## 2011-08-21 MED ORDER — ONDANSETRON HCL 4 MG/2ML IJ SOLN: 4.0000 mg | Freq: Three times a day (TID) | INTRAMUSCULAR | Status: DC | PRN

## 2011-08-21 MED ORDER — SCOPOLAMINE 1 MG/3DAYS TD PT72
1.0000 | MEDICATED_PATCH | TRANSDERMAL | Status: DC
  Administered 2011-08-21: 1.5 mg via TRANSDERMAL

## 2011-08-21 MED ORDER — ONDANSETRON HCL 4 MG/2ML IJ SOLN
INTRAMUSCULAR | Status: AC
  Filled 2011-08-21: qty 2

## 2011-08-21 MED ORDER — DIPHENHYDRAMINE HCL 50 MG/ML IJ SOLN: 25.0000 mg | INTRAMUSCULAR | Status: DC | PRN

## 2011-08-21 MED ORDER — OXYTOCIN 10 UNIT/ML IJ SOLN
INTRAMUSCULAR | Status: AC
  Filled 2011-08-21: qty 4

## 2011-08-21 SURGICAL SUPPLY — 31 items
CLOTH BEACON ORANGE TIMEOUT ST (SAFETY) ×2 IMPLANT
CONTAINER PREFILL 10% NBF 15ML (MISCELLANEOUS) ×2 IMPLANT
DRESSING TELFA 8X3 (GAUZE/BANDAGES/DRESSINGS) IMPLANT
DRSG COVADERM 4X6 (GAUZE/BANDAGES/DRESSINGS) ×1 IMPLANT
ELECT REM PT RETURN 9FT ADLT (ELECTROSURGICAL) ×2
ELECTRODE REM PT RTRN 9FT ADLT (ELECTROSURGICAL) ×1 IMPLANT
EXTRACTOR VACUUM M CUP 4 TUBE (SUCTIONS) IMPLANT
GAUZE SPONGE 4X4 12PLY STRL LF (GAUZE/BANDAGES/DRESSINGS) ×4 IMPLANT
GLOVE BIO SURGEON STRL SZ7.5 (GLOVE) ×4 IMPLANT
GOWN PREVENTION PLUS LG XLONG (DISPOSABLE) ×4 IMPLANT
GOWN PREVENTION PLUS XLARGE (GOWN DISPOSABLE) ×2 IMPLANT
KIT ABG SYR 3ML LUER SLIP (SYRINGE) IMPLANT
NDL HYPO 25X1 1.5 SAFETY (NEEDLE) ×1 IMPLANT
NDL HYPO 25X5/8 SAFETYGLIDE (NEEDLE) IMPLANT
NEEDLE HYPO 25X1 1.5 SAFETY (NEEDLE) ×2 IMPLANT
NEEDLE HYPO 25X5/8 SAFETYGLIDE (NEEDLE) IMPLANT
NS IRRIG 1000ML POUR BTL (IV SOLUTION) ×2 IMPLANT
PACK C SECTION WH (CUSTOM PROCEDURE TRAY) ×2 IMPLANT
PAD ABD 7.5X8 STRL (GAUZE/BANDAGES/DRESSINGS) IMPLANT
SLEEVE SCD COMPRESS KNEE MED (MISCELLANEOUS) IMPLANT
STAPLER VISISTAT 35W (STAPLE) ×2 IMPLANT
SUT MNCRL 0 VIOLET CTX 36 (SUTURE) ×2 IMPLANT
SUT MON AB 2-0 CT1 27 (SUTURE) ×2 IMPLANT
SUT MON AB-0 CT1 36 (SUTURE) ×4 IMPLANT
SUT MONOCRYL 0 CTX 36 (SUTURE) ×2
SUT PLAIN 0 NONE (SUTURE) ×1 IMPLANT
SUT PLAIN 2 0 XLH (SUTURE) IMPLANT
SYR CONTROL 10ML LL (SYRINGE) ×2 IMPLANT
TOWEL OR 17X24 6PK STRL BLUE (TOWEL DISPOSABLE) ×4 IMPLANT
TRAY FOLEY CATH 14FR (SET/KITS/TRAYS/PACK) ×2 IMPLANT
WATER STERILE IRR 1000ML POUR (IV SOLUTION) ×1 IMPLANT

## 2011-08-21 NOTE — Anesthesia Procedure Notes (Signed)
Spinal  Patient location during procedure: OR Start time: 08/21/2011 12:05 PM Staffing Performed by: anesthesiologist  Preanesthetic Checklist Completed: patient identified, site marked, surgical consent, pre-op evaluation, timeout performed, IV checked, risks and benefits discussed and monitors and equipment checked Spinal Block Patient position: sitting Prep: site prepped and draped and DuraPrep Patient monitoring: heart rate, cardiac monitor, continuous pulse ox and blood pressure Approach: midline Location: L3-4 Injection technique: single-shot Needle Needle type: Sprotte  Needle gauge: 24 G Needle length: 9 cm Assessment Sensory level: T4 Additional Notes Clear free flow CSF on first attempt.  Patient tolerated procedure well.

## 2011-08-21 NOTE — Progress Notes (Signed)
Patient ID: Alicia Owens, female   DOB: 1976-01-26, 35 y.o.   MRN: [id number] Consent done. H&P dictated. Pt examined. No changes noted.

## 2011-08-21 NOTE — Transfer of Care (Signed)
Immediate Anesthesia Transfer of Care Note  Patient: Alicia Owens  Procedure(s) Performed:  CESAREAN SECTION WITH BILATERAL TUBAL LIGATION - Repeat  Patient Location: PACU  Anesthesia Type: Spinal  Level of Consciousness: awake, alert  and oriented  Airway & Oxygen Therapy: Patient Spontanous Breathing  Post-op Assessment: Report given to PACU RN and Post -op Vital signs reviewed and stable  Post vital signs: Reviewed and stable  Complications: No apparent anesthesia complications

## 2011-08-21 NOTE — Anesthesia Postprocedure Evaluation (Addendum)
Anesthesia Post Note  Patient: Alicia Owens  Procedure(s) Performed:  CESAREAN SECTION WITH BILATERAL TUBAL LIGATION - Repeat  Anesthesia type: Spinal  Patient location: PACU  Post pain: Pain level controlled  Post assessment: Post-op Vital signs reviewed  Last Vitals:  Filed Vitals:   08/21/11 1400  BP: 130/65  Pulse: 46  Temp:   Resp: 18    Post vital signs: Reviewed  Level of consciousness: awake  Complications: No apparent anesthesia complications

## 2011-08-21 NOTE — Progress Notes (Signed)
Spoke to dr Jean Rosenthal about heart rate of 48-50.Marland Kitchen No new orders given.

## 2011-08-21 NOTE — Anesthesia Preprocedure Evaluation (Signed)
Anesthesia Evaluation  Patient identified by MRN, date of birth, ID band Patient awake    Reviewed: Allergy & Precautions, H&P , NPO status , Patient's Chart, lab work & pertinent test results, reviewed documented beta blocker date and time   History of Anesthesia Complications Negative for: history of anesthetic complications  Airway Mallampati: I TM Distance: >3 FB Neck ROM: full    Dental  (+) Teeth Intact   Pulmonary neg pulmonary ROS,  clear to auscultation        Cardiovascular neg cardio ROS regular Normal    Neuro/Psych  Headaches (migraines - last one month ago), Negative Psych ROS   GI/Hepatic Neg liver ROS, GERD- (on multiple meds prior to pregnancy, moderately controlled on nexium)  Medicated,Gastroparesis - takes herbal meds (had side effects from reglan)   Endo/Other  Hyperthyroidism (s/p medical management - off meds x1 year with normal thyroid function)   Renal/GU negative Renal ROS  Genitourinary negative   Musculoskeletal   Abdominal   Peds  Hematology negative hematology ROS (+)   Anesthesia Other Findings   Reproductive/Obstetrics (+) Pregnancy                           Anesthesia Physical Anesthesia Plan  ASA: II  Anesthesia Plan: Spinal   Post-op Pain Management:    Induction:   Airway Management Planned:   Additional Equipment:   Intra-op Plan:   Post-operative Plan:   Informed Consent: I have reviewed the patients History and Physical, chart, labs and discussed the procedure including the risks, benefits and alternatives for the proposed anesthesia with the patient or authorized representative who has indicated his/her understanding and acceptance.   Dental Advisory Given  Plan Discussed with: Surgeon and CRNA  Anesthesia Plan Comments:         Anesthesia Quick Evaluation

## 2011-08-21 NOTE — Addendum Note (Signed)
Addendum  created 08/21/11 1422 by Eber Ferrufino L. Rodman Pickle, MD   Modules edited:Notes Section

## 2011-08-21 NOTE — Consult Note (Signed)
Neonatology Note:   Attendance at C-section:    I was asked to attend this repeat C/S at term. The mother is a G2P1 GBS unknown with a history of anxiety/depression. ROM at delivery, fluid clear. Infant vigorous with good spontaneous cry and tone. Needed only minimal bulb suctioning. Ap 9/9. Lungs clear to ausc in DR. To CN to care of Pediatrician.   Deatra James, MD

## 2011-08-21 NOTE — Op Note (Signed)
Cesarean Section Procedure Note  Indications: Previous Cesarean section Desire for sterilization  Pre-operative Diagnosis: 39 week 1 day pregnancy.  Post-operative Diagnosis: same  Surgeon: Lenoard Aden   Assistants: Paul,CNM  Anesthesia: Spinal anesthesia  ASA Class: 2  Procedure Details  The patient was seen in the Holding Room. The risks, benefits, complications, treatment options, and expected outcomes were discussed with the patient.  The patient concurred with the proposed plan, giving informed consent. The risks of anesthesia, infection, bleeding and possible injury to other organs discussed. Injury to bowel, bladder, or ureter with possible need for repair discussed. Possible need for transfusion with secondary risks of hepatitis or HIV acquisition discussed. Post operative complications to include but not limited to DVT, PE and Pneumonia noted. The site of surgery properly noted/marked. The patient was taken to Operating Room # 9, identified as Alicia Owens and the procedure verified as C-Section Delivery. A Time Out was held and the above information confirmed.  After induction of anesthesia, the patient was draped and prepped in the usual sterile manner. A Pfannenstiel incision was made and carried down through the subcutaneous tissue to the fascia. Fascial incision was made and extended transversely using Mayo scissors. The fascia was separated from the underlying rectus tissue superiorly and inferiorly. The peritoneum was identified and entered. Peritoneal incision was extended longitudinally. The utero-vesical peritoneal reflection was incised transversely and the bladder flap was bluntly freed from the lower uterine segment. A low transverse uterine incision(Kerr hysterotomy) was made. Delivered from LOT presentation was a  female with Apgar scores of 9 at one minute and 9 at five minutes. After the umbilical cord was clamped and cut cord blood was obtained for evaluation. The  placenta was removed intact and appeared normal. The uterine outline, tubes and ovaries appeared normal. The uterine incision was closed with running locked sutures of 0 Monocryl x 1 layers.  Right tube traced to fimbriated end. Ampullary / isthmic portion of tube noted. Avascular portion of mesosalpinx noted and cauterized creating a window. O plain suture ties placed distally and proximally. Tubal segments excised and sent to pathology. Tubal lumens noted and cauterized. Same procedure on left tube.Hemostasis was observed. Lavage was carried out until clear. The fascia was then reapproximated with running sutures of 0 Monocryl.  Leona layer re-approximated with a 0 plain suture.The skin was reapproximated with staples.  Instrument, sponge, and needle counts were correct prior the abdominal closure and at the conclusion of the case.   Findings: As above  Estimated Blood Loss:  500         Drains: foley                 Specimens: tubal segments and placenta                 Complications:  None; patient tolerated the procedure well.         Disposition: PACU - hemodynamically stable.         Condition: stable  Attending Attestation: I performed the procedure.

## 2011-08-22 ENCOUNTER — Encounter (HOSPITAL_COMMUNITY): Payer: Self-pay

## 2011-08-22 LAB — CBC
MCH: 29.7 pg (ref 26.0–34.0)
Platelets: 232 10*3/uL (ref 150–400)
RBC: 3.43 MIL/uL — ABNORMAL LOW (ref 3.87–5.11)

## 2011-08-22 NOTE — Anesthesia Postprocedure Evaluation (Signed)
  Anesthesia Post Note  Patient: Alicia Owens  Procedure(s) Performed:  CESAREAN SECTION WITH BILATERAL TUBAL LIGATION - Repeat  Anesthesia type: Spinal  Patient location: Mother/Baby  Post pain: Pain level controlled  Post assessment: Post-op Vital signs reviewed  Last Vitals:  Filed Vitals:   08/22/11 0433  BP: 134/84  Pulse: 77  Temp: 37 C  Resp: 18    Post vital signs: Reviewed  Level of consciousness: awake  Complications: No apparent anesthesia complications

## 2011-08-22 NOTE — Progress Notes (Signed)
Pt complaint of soreness/tightness in throat. Pt has no problem swallowing or breathing, states throat is tender to touch. Noted some swelling near lymph nodes. Given throat lozenges, will continue to monitor. Oral temp 98.4

## 2011-08-22 NOTE — Addendum Note (Signed)
Addendum  created 08/22/11 0865 by Cephus Shelling   Modules edited:Notes Section

## 2011-08-22 NOTE — Progress Notes (Signed)
Subjective: POD# 1 Information for the patient's newborn:  Alicia Owens, [name] [[id number]]  female  / circ done  Reports feeling OK but sore throat since early AM, feels lymph nodes in neck swollen. Denies fever, chills. Hurts to swallow.  Feeding: breast Patient reports tolerating PO.  Breast symptoms:none Pain controlled withpercocet and motrin. Denies HA/SOB/C/P/N/V/dizziness. Flatus present. She reports vaginal bleeding as normal, without clots.  She is ambulating, urinating without difficult.     Objective:   VS:  Filed Vitals:   08/22/11 0024 08/22/11 0206 08/22/11 0433 08/22/11 1000  BP: 132/81 153/87 134/84 137/84  Pulse: 69 71 77 87  Temp: 98.4 F (36.9 C) 98.3 F (36.8 C) 98.6 F (37 C) 98.3 F (36.8 C)  TempSrc: Oral Oral  Oral  Resp: 18 20 18 18   Weight:      SpO2: 99% 100% 98%      Intake/Output Summary (Last 24 hours) at 08/22/11 1034 Last data filed at 08/22/11 0350  Gross per 24 hour  Intake   6971 ml  Output   3250 ml  Net   3721 ml        Basename 08/22/11 0525  WBC 16.3*  HGB 10.2*  HCT 30.7*  PLT 232     Blood type: A/Positive/-- (05/24 0000)  Rubella:       Physical Exam:  General: alert, cooperative and no distress Neck: mild lymphadenopathy, mild erythema oropharynges   CV: Regular rate and rhythm Resp: clear Abdomen: soft, nontender, normal bowel sounds Incision: dry, intact and small serous drainage on LST dressing Uterine Fundus: firm, below umbilicus, nontender Lochia: minimal Ext: edema trace pedal edema, L > R and Homans sign is negative, no sign of DVT      Assessment/Plan: 35 y.o.  status post Cesarean section. POD# 1.  s/p Cesarean Delivery.  Indications: repeat elective and BTL                Principal Problem:  *PP care - C/S 12/17 Doing well, stable post-op.     Sore throat - suspect viral,   comfort measures - throat lozenge, warm fluids           Advance diet as tolerated Ambulate Routine post-op  care  Alicia Owens 08/22/2011, 10:34 AM

## 2011-08-23 MED ORDER — OXYCODONE-ACETAMINOPHEN 5-325 MG PO TABS: 1.0000 | ORAL_TABLET | ORAL | Status: AC | PRN

## 2011-08-23 MED ORDER — IBUPROFEN 600 MG PO TABS: 600.0000 mg | ORAL_TABLET | Freq: Four times a day (QID) | ORAL | Status: AC | PRN

## 2011-08-23 NOTE — Progress Notes (Signed)
Patient ID: Alicia Owens, female   DOB: 05-05-1976, 35 y.o.   MRN: [id number]  POSTOPERATIVE DAY # 2 S/P cesarean section   S:         Reports feeling well - desire early discharge today             Tolerating po intake / no nausea / no vomiting / + flatus / no BM             Bleeding is light             Pain controlled withprescription NSAID's including motrin only             Up ad lib / ambulatory  Newborn breast feeding     O:  A & O x 3 NAD             VS: Blood pressure 132/82, pulse 72, temperature 97.2 F (36.2 C), temperature source Oral, resp. rate 18, weight 78.926 kg (174 lb), last menstrual period 11/20/2010, SpO2 100.00%, unknown if currently breastfeeding.  Lungs: Clear and unlabored  Heart: regular rate and rhythm   Abdomen: soft, non-tender, non-distended              Fundus: firm, non-tender, U-1             Dressing OFF              Incision:  approximated with staples / no erythema / no ecchymosis / no drainage  Perineum: no edema  Lochia: light  Extremities: no edema, no calf pain or tenderness, negative Homans  A:        POD # 2 S/P cesarean section            Stable status  P:        Routine postoperative care              Discharge today     Alicia Owens 08/23/2011, 9:21 AM

## 2011-08-23 NOTE — Discharge Summary (Signed)
POSTOPERATIVE DISCHARGE SUMMARY:  Patient ID: Alicia Owens MRN: [id number] DOB/AGE: 35-Aug-1977 35 y.o.  Admit date: 08/21/2011 Discharge date:  08/23/2011  Admission Diagnoses: 39 weeks - previous c-section / elective repeat    Discharge Diagnoses:   Term Pregnancy-delivered / POD 2 s/p cesarean section-repeat  Prenatal history: G2P2002   EDC : 08/27/2011, by Last Menstrual Period  Prenatal care at [facility]  Primary provider : Taavon Prenatal course complicated by previous c-section  Prenatal Labs: ABO, Rh: A (05/24 0000) positive Antibody:  negative Rubella:   Immune RPR: NON REACTIVE (12/14 0919)  HBsAg:   Neg HIV:   NR GBS:   not done 1 hr Glucola : nl   Medical / Surgical History :  Past medical history:  Past Medical History  Diagnosis Date  . Headache   . Gastroparesis   . GERD (gastroesophageal reflux disease)   . Hyperthyroidism 2011  . PP care - C/S 12/17 08/22/2011    Past surgical history:  Past Surgical History  Procedure Date  . Cesarean section   . Breast surgery   . Appendectomy     Family History: History reviewed. No pertinent family history.  Social History:  reports that she has never smoked. She does not have any smokeless tobacco history on file. She reports that she does not drink alcohol or use illicit drugs.   Allergies: Codeine; Promethazine hcl; and Sulfonamide derivatives    Current Medications at time of admission:  Prior to Admission medications   Medication Sig Start Date End Date Taking? Authorizing Provider  esomeprazole (NEXIUM) 40 MG capsule Take 40 mg by mouth 2 (two) times daily.     Yes Historical Provider, MD  ibuprofen (ADVIL,MOTRIN) 600 MG tablet Take 1 tablet (600 mg total) by mouth every 6 (six) hours as needed for pain. 08/23/11 09/02/11  Marlinda Mike  oxyCODONE-acetaminophen (PERCOCET) 5-325 MG per tablet Take 1 tablet by mouth every 4 (four) hours as needed (moderate - severe pain).  08/23/11 09/02/11  Marlinda Mike  prenatal vitamin w/FE, FA (PRENATAL 1 + 1) 27-1 MG TABS Take 1 tablet by mouth daily.      Historical Provider, MD      Intrapartum Course: none - elective c-section scheduled    Procedures: Cesarean section delivery of female newborn by Dr Billy Coast  See operative report for further details  Postoperative / postpartum course: uncomplicated and uneventful  Physical Exam: within normal limits  VSS: Blood pressure 132/82, pulse 72, temperature 97.2 F (36.2 C), temperature source Oral, resp. rate 18, weight 78.926 kg (174 lb), last menstrual period 11/20/2010, SpO2 100.00%, unknown if currently breastfeeding.  LABS: stable : preop hgb 11.5 and postop hgb 10.2 / PLT 232  Incision:  approximated with staples / no erythema / no ecchymosis / no drainage Staples: intact for removal at WOB day 3 or 4   Discharge Instructions:  Discharged Condition: stable Activity: pelvic rest and postoperative restrictions x 2 weeks Diet: routine Medications: PNV, Ibuprofen and Percocet Current Discharge Medication List    START taking these medications   Details  ibuprofen (ADVIL,MOTRIN) 600 MG tablet Take 1 tablet (600 mg total) by mouth every 6 (six) hours as needed for pain. Qty: 30 tablet, Refills: 0    oxyCODONE-acetaminophen (PERCOCET) 5-325 MG per tablet Take 1 tablet by mouth every 4 (four) hours as needed (moderate - severe pain). Qty: 15 tablet, Refills: 0      CONTINUE these medications which have NOT CHANGED  Details  esomeprazole (NEXIUM) 40 MG capsule Take 40 mg by mouth 2 (two) times daily.      prenatal vitamin w/FE, FA (PRENATAL 1 + 1) 27-1 MG TABS Take 1 tablet by mouth daily.        STOP taking these medications     ondansetron (ZOFRAN) 8 MG tablet        Condition: stable Postpartum Instructions: refer to practice specific booklet Discharge to: home Disposition: Home or Self Care  Follow up : 2 days at [facility] for staple removal  and 6 weeks for routine postpartum visit  Follow-up Information    Follow up with Lenoard Aden, MD. Make an appointment in 6 weeks.   Contact information:   [address] [place] [address] [phone]           Signed: Marlinda Mike 08/23/2011, 9:26 AM

## 2011-09-07 ENCOUNTER — Ambulatory Visit (HOSPITAL_COMMUNITY)
Admission: RE | Admit: 2011-09-07 | Discharge: 2011-09-07 | Disposition: A | Discharge status: Home / Self Care | Payer: BC Managed Care – PPO | Source: Ambulatory Visit | Attending: Family Medicine | Admitting: Family Medicine

## 2011-09-07 ENCOUNTER — Other Ambulatory Visit: Payer: Self-pay | Admitting: Family Medicine

## 2011-09-07 DIAGNOSIS — R7989 Other specified abnormal findings of blood chemistry: Secondary | ICD-10-CM | POA: Insufficient documentation

## 2011-09-07 DIAGNOSIS — M7989 Other specified soft tissue disorders: Secondary | ICD-10-CM | POA: Insufficient documentation

## 2011-09-07 DIAGNOSIS — M79606 Pain in leg, unspecified: Secondary | ICD-10-CM

## 2011-10-06 ENCOUNTER — Other Ambulatory Visit: Payer: Self-pay | Admitting: Family Medicine

## 2011-10-06 DIAGNOSIS — R1011 Right upper quadrant pain: Secondary | ICD-10-CM

## 2011-10-06 DIAGNOSIS — R1013 Epigastric pain: Secondary | ICD-10-CM

## 2011-10-11 ENCOUNTER — Ambulatory Visit (HOSPITAL_COMMUNITY)
Admission: RE | Admit: 2011-10-11 | Discharge: 2011-10-11 | Disposition: A | Discharge status: Home / Self Care | Payer: BC Managed Care – PPO | Source: Ambulatory Visit | Attending: Family Medicine | Admitting: Family Medicine

## 2011-10-11 DIAGNOSIS — R1011 Right upper quadrant pain: Secondary | ICD-10-CM

## 2011-10-11 DIAGNOSIS — R1013 Epigastric pain: Secondary | ICD-10-CM | POA: Insufficient documentation

## 2011-10-16 ENCOUNTER — Encounter (INDEPENDENT_AMBULATORY_CARE_PROVIDER_SITE_OTHER): Payer: Self-pay | Admitting: Internal Medicine

## 2011-10-16 ENCOUNTER — Ambulatory Visit (INDEPENDENT_AMBULATORY_CARE_PROVIDER_SITE_OTHER): Payer: BC Managed Care – PPO | Admitting: Internal Medicine

## 2011-10-16 DIAGNOSIS — R7401 Elevation of levels of liver transaminase levels: Secondary | ICD-10-CM

## 2011-10-16 DIAGNOSIS — I10 Essential (primary) hypertension: Secondary | ICD-10-CM

## 2011-10-16 DIAGNOSIS — R748 Abnormal levels of other serum enzymes: Secondary | ICD-10-CM

## 2011-10-16 LAB — COMPREHENSIVE METABOLIC PANEL
ALT: 34 U/L (ref 0–35)
Alkaline Phosphatase: 64 U/L (ref 39–117)
CO2: 27 mEq/L (ref 19–32)
Sodium: 140 mEq/L (ref 135–145)
Total Bilirubin: 0.3 mg/dL (ref 0.3–1.2)
Total Protein: 7.1 g/dL (ref 6.0–8.3)

## 2011-10-16 NOTE — Patient Instructions (Signed)
F/u up in one month with a cmet

## 2011-10-16 NOTE — Progress Notes (Addendum)
Subjective:     Patient ID: Alicia Owens, female   DOB: [birth year], 36 y.o.   MRN: [id number]  HPI  Referred to our office by Dr. Lilyan Punt for elevated liver enzymes. Noted 10/06/2011 AST 50 and ALT 60. ALP normal at 60. She is 2months post partum via C-section.  She has been on Lisinopril for a month.  Appetite is good. She has weight loss. She does have rt upper quadrant pain off an on for yrs. Over the past 2 months, the pain has started back again.  Pain usually worse at night. She describes the pain as a achy type pain. Pain not related to food.  If she eats sometimes it get better, and sometimes there is no change. She occasionally has acid reflux but actually is better now that she is postpartum.   She has had  a negative upper endoscopy and small bowel biopsies. She had a normal colonoscopy in September 2010. and a normal barium enema which ruled out a mobile cecum. (Per DR. D Brodie's notes) She had a normal CT scan of the abdomen and pelvis in November 2010. Her upper abdominal ultrasound and HIDA scans were normal showing a 73% ejection fraction.   10/13/2009 Diagnostic laparoscopy, laparoscopic appendectomy for abdominal pain of unclear etiology.  Renae Fickle S. Toth 111).  25/2013 Hepatitis B Surface Antigen negative. Hepatits C antibody. Ceruloplasmin 26, ANA negative. Sed rate 7 normal  09/07/2011 Left lower extremity venous duplex US: Normal. [place] 10/11/2011: Negative abdominal US.  CMD measure 0.4cm.  HIDA scan 2 yrs ago normal   NM Gastric emptying study 11/01/09 Findings:  Images obtained at 30-minute intervals for 2 hours demonstrate  subjectively decreased gastric emptying throughout exam.  Quantitative analysis reveals minimal emptying of tracer at 1 hour  and 35% emptying at 2 hours.  Findings represent delayed gastric emptying.  IMPRESSION:  Abnormal exam demonstrating delayed gastric emptying.  11/14/2006 Esophageal pH study with a Bravo device Normal esophageal pH study on  PPI.  11/12/2006 EGD with placement of Bravo device:  Normal appearing esophageal mucosa without evidence of erosive esophagitis or Barrett's.  Bravo device placed 6 cm proximal to GE junction. Nonerosive antral gastritis.  Review of Systems  See hpi Current Outpatient Prescriptions  Medication Sig Dispense Refill  . ALPRAZolam (XANAX) 0.5 MG tablet Take 0.5 mg by mouth at bedtime as needed.      Marland Kitchen alum & mag hydroxide-simeth (MAALOX PLUS) 400-400-40 MG/5ML suspension Take by mouth every 6 (six) hours as needed.      . benazepril-hydrochlorthiazide (LOTENSIN HCT) 10-12.5 MG per tablet Take 1 tablet by mouth daily.      . cyclobenzaprine (FLEXERIL) 10 MG tablet Take 10 mg by mouth 3 (three) times daily as needed.      . dicyclomine (BENTYL) 10 MG capsule Take 10 mg by mouth 4 (four) times daily -  before meals and at bedtime.      . ferrous gluconate (FERGON) 325 MG tablet Take 325 mg by mouth daily with breakfast.      . ibuprofen (ADVIL,MOTRIN) 200 MG tablet Take 200 mg by mouth every 6 (six) hours as needed.      . lactobacillus acidophilus (BACID) TABS Take 2 tablets by mouth 3 (three) times daily.      Marland Kitchen lisinopril (PRINIVIL,ZESTRIL) 2.5 MG tablet Take 2.5 mg by mouth daily.      Marland Kitchen loratadine (CLARITIN) 10 MG tablet Take 10 mg by mouth daily.      Marland Kitchen  mometasone (NASONEX) 50 MCG/ACT nasal spray Place 2 sprays into the nose daily.      . Multiple Vitamins-Minerals (MULTIVITAMIN WITH MINERALS) tablet Take 1 tablet by mouth daily.      . ondansetron (ZOFRAN) 4 MG tablet Take 4 mg by mouth every 8 (eight) hours as needed.      . ranitidine (ZANTAC) 150 MG tablet Take 150 mg by mouth 2 (two) times daily.       Past Medical History  Diagnosis Date  . Headache   . Gastroparesis   . GERD (gastroesophageal reflux disease)   . Hyperthyroidism 2011  . PP care - C/S 12/17 08/22/2011  . Hypertension    Past Surgical History  Procedure Date  . Cesarean section   . Breast surgery     Breast  reduction  . Appendectomy    History   Social History  . Marital Status: Married    Spouse Name: N/A    Number of Children: N/A  . Years of Education: N/A   Occupational History  . Not on file.   Social History Main Topics  . Smoking status: Never Smoker   . Smokeless tobacco: Not on file  . Alcohol Use: No  . Drug Use: No  . Sexually Active:    Other Topics Concern  . Not on file   Social History Narrative  . No narrative on file   Family Status  Relation Status Death Age  . Mother Alive     good health  . Father Alive     good health  . Sister Alive     good health   Allergies  Allergen Reactions  . Codeine     REACTION: GI Upset, Vomiting  . Promethazine Hcl     REACTION: Throat felt strange  . Sulfonamide Derivatives     REACTION: Rash       Objective:   Physical Exam   Filed Vitals:   10/16/11 1141  Height: 5' 1.5" (1.562 m)  Weight: 147 lb 9.6 oz (66.951 kg)    Alert and oriented. Skin warm and dry. Oral mucosa is moist.   . Sclera anicteric, conjunctivae is pink. Thyroid not enlarged. No cervical lymphadenopathy. Lungs clear. Heart regular rate and rhythm.  Abdomen is soft. Bowel sounds are positive. No hepatomegaly. No abdominal masses felt. No tenderness.  No edema to lower extremities. Patient is alert and oriented.      Assessment:   elevated transaminases.with normal US abdomen.  Rt upper quadrant pain.     Plan:   cmet today.  OV in one month with a CMET.   If abnormal will discuss with Dr. Karilyn Cota.

## 2011-10-17 ENCOUNTER — Telehealth (INDEPENDENT_AMBULATORY_CARE_PROVIDER_SITE_OTHER): Payer: Self-pay | Admitting: *Deleted

## 2011-10-17 DIAGNOSIS — R1013 Epigastric pain: Secondary | ICD-10-CM

## 2011-10-17 NOTE — Telephone Encounter (Signed)
Per TerriSetzer,NP patient will need C-Met and office visit with Dr. Karilyn Cota in 1-2 months. I have noted lab for 11-14-11.

## 2011-10-18 NOTE — Telephone Encounter (Signed)
LM for [facility] to return the call to reschedule her apt.

## 2011-10-18 NOTE — Telephone Encounter (Signed)
Rescheduled apt with Dr. Karilyn Cota for 11/21/11 at 10:00 am.

## 2011-11-14 ENCOUNTER — Ambulatory Visit (INDEPENDENT_AMBULATORY_CARE_PROVIDER_SITE_OTHER): Payer: BC Managed Care – PPO | Admitting: Internal Medicine

## 2011-11-20 ENCOUNTER — Other Ambulatory Visit (INDEPENDENT_AMBULATORY_CARE_PROVIDER_SITE_OTHER): Payer: Self-pay | Admitting: Internal Medicine

## 2011-11-21 ENCOUNTER — Ambulatory Visit (INDEPENDENT_AMBULATORY_CARE_PROVIDER_SITE_OTHER): Payer: BC Managed Care – PPO | Admitting: Internal Medicine

## 2011-11-21 ENCOUNTER — Encounter (INDEPENDENT_AMBULATORY_CARE_PROVIDER_SITE_OTHER): Payer: Self-pay | Admitting: Internal Medicine

## 2011-11-21 VITALS — BP 110/70 | HR 76 | Temp 97.3°F | Resp 16 | Ht 61.0 in | Wt 146.0 lb

## 2011-11-21 DIAGNOSIS — R109 Unspecified abdominal pain: Secondary | ICD-10-CM

## 2011-11-21 DIAGNOSIS — R1010 Upper abdominal pain, unspecified: Secondary | ICD-10-CM

## 2011-11-21 DIAGNOSIS — R110 Nausea: Secondary | ICD-10-CM

## 2011-11-21 LAB — COMPREHENSIVE METABOLIC PANEL
ALT: 29 U/L (ref 0–35)
AST: 26 U/L (ref 0–37)
Albumin: 4.9 g/dL (ref 3.5–5.2)
Alkaline Phosphatase: 73 U/L (ref 39–117)
BUN: 8 mg/dL (ref 6–23)
CO2: 27 mEq/L (ref 19–32)
Calcium: 10.3 mg/dL (ref 8.4–10.5)
Chloride: 99 mEq/L (ref 96–112)
Creat: 0.94 mg/dL (ref 0.50–1.10)
Glucose, Bld: 88 mg/dL (ref 70–99)
Potassium: 5.2 mEq/L (ref 3.5–5.3)
Sodium: 141 mEq/L (ref 135–145)
Total Bilirubin: 0.4 mg/dL (ref 0.3–1.2)
Total Protein: 7.8 g/dL (ref 6.0–8.3)

## 2011-11-21 NOTE — Patient Instructions (Signed)
HIDA scan to be scheduled. Agree with starting Zoloft as recommended by Dr. Lilyan Punt.

## 2011-11-21 NOTE — Progress Notes (Signed)
Presenting complaint;  Upper abdominal pain and nausea.  Subjective: Alicia Owens is 36 year old Caucasian female patient of Dr. Lorin Picket Luking's who is here for reevaluation of her abdominal pain nausea and recently noted abnormal transaminases. She was recently seen in our office on 08/14/2012 by Ms. Dorene Ar NP. Since hepatobiliary ultrasound was negative and markers for hepatitis B and C were negative along with normal ceruloplasmin normal sedimentation rate of 7 and negative ANA was recommended repeating her blood work in few weeks.  She does not feel well. She complains of upper abdominal pain which is experienced multiple times a week if not daily. Pain starts in the right rib cage and radiates medially or to the left side was otherwise worse. At times it starts in midepigastrium and radiates laterally. She describes this as aching constant pain which may last for hours. Ibuprofen sometimes helps. She has not been able to pinpoint any triggers. Zofran does not always help. She feels worse in the morning better in the middle of the day and symptoms get worse again in the evening. She rarely vomits. She says her heartburn is well controlled with therapy. In the past she has taken Percocet which has helped. Her appetite is so-so and she has lost 7 pounds this year. She also complains changing consistency of her stools from normal to hard to mushy. Lately she is noted blood on the tissue but no frank bleeding. She denies fever chills or night sweats. Patient uses Advil 3 tablets at a time but no more than 3-4 times each week. In February 2011 she was felt to have gastroparesis he isn't treated with metoclopramide which was discontinued because she developed muscle twitching. She she was given prescription for Zoloft by Dr. Gerda Diss but she has not started this prescription yet. She's had this pain off and on for years and has undergone multiple studies as reviewed below.  EGD with bravo in March 2008 by me  for recurrent chest pain well controlled heartburn. Study was normal on PPI. Colonoscopy by Dr. Lina Sar in September 2010 with a question of sigmoid colitis but biopsy was normal. Abdominopelvic CT in October 2010 which is unremarkable. Single contrast barium enema in December 2010 which is normal. HIDA scan with CCK in December 2010 was normal   EGD in January 2011 by Dr. Lina Sar revealing gastritis the CLOtest was negative and small bowel biopsy was negative for celiac disease.  Solid phase gastric emptying study by Dr. Gerda Diss in February 2011. 75% of activity was in the stomach at 2 hours. Reglan discontinued after 6 weeks because of side effects.   Evaluation at [facility] by Dr. Liberty Handy. Hydrogen breath test was normal. She was advised to try ginger with minimal relief.        Current Medications: Current Outpatient Prescriptions  Medication Sig Dispense Refill  . ALPRAZolam (XANAX) 0.5 MG tablet Take 0.5 mg by mouth at bedtime as needed.      Marland Kitchen alum & mag hydroxide-simeth (MAALOX PLUS) 400-400-40 MG/5ML suspension Take by mouth every 6 (six) hours as needed.      . benazepril-hydrochlorthiazide (LOTENSIN HCT) 10-12.5 MG per tablet Take 1 tablet by mouth daily.      . cyclobenzaprine (FLEXERIL) 10 MG tablet Take 10 mg by mouth 3 (three) times daily as needed.      . dicyclomine (BENTYL) 10 MG capsule Take 10 mg by mouth 4 (four) times daily -  before meals and at bedtime.      Marland Kitchen  ferrous gluconate (FERGON) 325 MG tablet Take 325 mg by mouth daily with breakfast.      . ibuprofen (ADVIL,MOTRIN) 200 MG tablet Take 200 mg by mouth every 6 (six) hours as needed.      Marland Kitchen ketoconazole (NIZORAL) 2 % cream Apply 1 application topically daily.       Marland Kitchen lactobacillus acidophilus (BACID) TABS Take 2 tablets by mouth 3 (three) times daily.      Marland Kitchen lisinopril (PRINIVIL,ZESTRIL) 2.5 MG tablet Take 2.5 mg by mouth daily.      Marland Kitchen loratadine (CLARITIN) 10 MG tablet Take 10 mg by mouth as  needed.       . mometasone (NASONEX) 50 MCG/ACT nasal spray Place 2 sprays into the nose as needed.       . Multiple Vitamins-Minerals (MULTIVITAMIN WITH MINERALS) tablet Take 1 tablet by mouth daily.      . ondansetron (ZOFRAN) 4 MG tablet Take 8 mg by mouth every 8 (eight) hours as needed.       . ranitidine (ZANTAC) 150 MG tablet Take 150 mg by mouth as needed.       . Simethicone (GAS-X PO) Take by mouth. Per Patient she takes 2 - 4 pills daily as needed      . SUMAtriptan (IMITREX) 100 MG tablet as needed.        Past medical history; IBS. Chronic GERD. Migraine Diagnostic laparoscopy in February 2011 mid appendectomy. No relief of symptoms. Hypertension diagnosed 3 months ago Bilateral breast reduction in December 2004. C-section in May 2007 and more recently in December 2012. Allergic rhinitis. Fundal pedal rash.  Objective: Blood pressure 110/70, pulse 76, temperature 97.3 F (36.3 C), temperature source Oral, resp. rate 16, height 5\' 1"  (1.549 m), weight 146 lb (66.225 kg), last menstrual period 11/10/2011, not currently breastfeeding. Patient does not appear to be in any distress. Conjunctiva is pink. Sclera is nonicteric Oropharyngeal mucosa is normal. No neck masses or thyromegaly noted. Cardiac exam with regular rhythm normal S1 and S2. No murmur or gallop noted. Lungs are clear to auscultation. Abdomen is symmetrical. Bowel sounds are normal. Palpation reveals soft abdomen with mild tenderness below both costal margins as well as epigastric region but no organomegaly or masses noted.  No LE edema or clubbing noted.  Labs/studies Results: CMP     Component Value Date/Time   NA 141 11/20/2011 0830   K 5.2 11/20/2011 0830   CL 99 11/20/2011 0830   CO2 27 11/20/2011 0830   GLUCOSE 88 11/20/2011 0830   BUN 8 11/20/2011 0830   CREATININE 0.94 11/20/2011 0830   CREATININE 0.72 08/18/2009 1330   CALCIUM 10.3 11/20/2011 0830   PROT 7.8 11/20/2011 0830   ALBUMIN 4.9 11/20/2011  0830   AST 26 11/20/2011 0830   ALT 29 11/20/2011 0830   ALKPHOS 73 11/20/2011 0830   BILITOT 0.4 11/20/2011 0830   GFRNONAA >60 08/18/2009 1330   GFRAA  Value: >60        The eGFR has been calculated using the MDRD equation. This calculation has not been validated in all clinical situations. eGFR's persistently <60 mL/min signify possible Chronic Kidney Disease. 08/18/2009 1330      Assessment:  #1. Upper abdominal pain with intractable nausea. Patient has undergone extensive evaluation as summarized above without symptomatic relief. Gastric emptying study 2 years ago was abnormal she did not is resolution of her symptoms with six weeks of Reglan. I do not believe she has a chronic or significant gastroparesis  and I doubt that it has anything to do with her symptoms. Her symptoms are not typical of biliary tract disease but since no other etiology is obvious need to rule out biliary dyskinesia. Am afraid we may be left with chronic abdominal pain without a definite diagnosis. #2. Mildly elevated transaminases are now normal. Therefore no further workup indicated. Patient's symptoms are not suggestive of as SOD dysfunction.    Plan:  Patient advised to start Zoloft as it might help with her nausea and even abdominal pain while we're trying to figure out source of her symptoms. HIDA scan with CCK. Further recommendations to follow.

## 2011-11-29 ENCOUNTER — Ambulatory Visit (HOSPITAL_COMMUNITY)
Admission: RE | Admit: 2011-11-29 | Discharge: 2011-11-29 | Disposition: A | Discharge status: Home / Self Care | Payer: BC Managed Care – PPO | Source: Ambulatory Visit | Attending: Internal Medicine | Admitting: Internal Medicine

## 2011-11-29 ENCOUNTER — Encounter (HOSPITAL_COMMUNITY): Payer: Self-pay

## 2011-11-29 DIAGNOSIS — R1010 Upper abdominal pain, unspecified: Secondary | ICD-10-CM

## 2011-11-29 DIAGNOSIS — R109 Unspecified abdominal pain: Secondary | ICD-10-CM | POA: Insufficient documentation

## 2011-11-29 DIAGNOSIS — R932 Abnormal findings on diagnostic imaging of liver and biliary tract: Secondary | ICD-10-CM | POA: Insufficient documentation

## 2011-11-29 MED ORDER — SINCALIDE 5 MCG IJ SOLR
INTRAMUSCULAR | Status: AC
  Administered 2011-11-29: 1.33 ug via INTRAVENOUS
  Filled 2011-11-29: qty 5

## 2011-11-29 MED ORDER — TECHNETIUM TC 99M MEBROFENIN IV KIT
5.0000 | PACK | Freq: Once | INTRAVENOUS | Status: AC | PRN
  Administered 2011-11-29: 5.1 via INTRAVENOUS

## 2011-12-04 ENCOUNTER — Telehealth (INDEPENDENT_AMBULATORY_CARE_PROVIDER_SITE_OTHER): Payer: Self-pay | Admitting: *Deleted

## 2011-12-04 NOTE — Telephone Encounter (Signed)
Returning Dr. Patty Sermons call. Alicia Owens would like to get dr. Carolynne Edouard as her surgeon and his office phone number is [phone]. She would also like to see if Dr. Karilyn Cota has reviewed her gastropharsis test and does she have it? does he think the pain is gall bladder related? Please call her at  [phone] (after 5) or [phone] (after 5), if it is during the day call  [phone].

## 2011-12-05 NOTE — Telephone Encounter (Signed)
Patient's color returned last evening but there was no answer. I talked with Dr. Carolynne Edouard this morning. Appointment made for him to see patient

## 2011-12-06 ENCOUNTER — Telehealth (INDEPENDENT_AMBULATORY_CARE_PROVIDER_SITE_OTHER): Payer: Self-pay | Admitting: General Surgery

## 2011-12-06 NOTE — Telephone Encounter (Signed)
Pt has appt with Dr.Toth for gallbladder on 01/02/12.  She is calling today, crying with pain, asking if she can be moved up to see him sooner.  She understands his is on vacation this week.  Please call her and see if any cancellations or way to see her sooner.

## 2011-12-07 ENCOUNTER — Telehealth (INDEPENDENT_AMBULATORY_CARE_PROVIDER_SITE_OTHER): Payer: Self-pay | Admitting: General Surgery

## 2011-12-07 NOTE — Telephone Encounter (Signed)
Pt calling from work, still in pain and with daily increasing pain and nausea, not responsive to Zofran.  She states she did not hear anything from Dr. Billey Chang nurse yesterday about the possibility of moving appt forward.   Please call her.

## 2011-12-07 NOTE — Telephone Encounter (Signed)
Appointment moved up

## 2011-12-12 ENCOUNTER — Telehealth (INDEPENDENT_AMBULATORY_CARE_PROVIDER_SITE_OTHER): Payer: Self-pay | Admitting: General Surgery

## 2011-12-12 NOTE — Telephone Encounter (Signed)
Pt calling with extreme nausea (on-going and worsening.)  She is taking Zofran and is allergic to Phenergan.  Unable to improve her appt status, so suggested she call Dr. Karilyn Cota to address nausea management until then.  She understands and will comply.

## 2011-12-13 ENCOUNTER — Telehealth (INDEPENDENT_AMBULATORY_CARE_PROVIDER_SITE_OTHER): Payer: Self-pay | Admitting: General Surgery

## 2011-12-13 ENCOUNTER — Encounter (INDEPENDENT_AMBULATORY_CARE_PROVIDER_SITE_OTHER): Payer: Self-pay | Admitting: Surgery

## 2011-12-13 NOTE — Telephone Encounter (Signed)
Appt arranged for pt with Dr. Corliss Skains tomorrow at 11:15.  Pt contacted to be here at 10:45 for this appt.  She is very appreciative of being moved up.

## 2011-12-13 NOTE — Telephone Encounter (Signed)
         [facility] Size      Small Medium Large Extra Extra Large             Payton Spark  Description:  23 month old female  12/13/2011 3:47 PM Telephone Provider:  Colman Cater, CMA  MRN: [id number] Department:  [facility]                Call Documentation     Colman Cater, CMA 12/13/2011 4:12 PM Signed  Error. Documenting on incorrect pt. BPenny, RN Colman Cater, CMA 12/13/2011 4:10 PM Signed  Pt calling again today, crying with severe, ongoing, intractable nausea, resistant to Zofran therapy. She is allergic to Phenergan. 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 Carolynne Edouard.          Encounter MyChart Messages     No messages in this encounter         Created by     Colman Cater, CMA on 12/13/2011 3:47 PM                    Pt calling again today, crying with intractable nausea, resistant to Zofran therapy.  She admits she also has post partum depression as well.  Pt still wants to try to wait for Dr. Carolynne Edouard, but she is beginning to waver.

## 2011-12-13 NOTE — Telephone Encounter (Signed)
Dr. Lilyan Punt, pt's PCP, calling about his pt with GB disease.  She called him, saying she was going to ER tonight because she cannot deal with the nausea anymore.  He understands the ER will send her home, not admit her for surgery, and has convinced her to stay home.  He is requesting she be seen in the next 2-3 days, by any [facility] surgeon, to "get the ball rolling" for surgery to be scheduled.  Please update the nurse(s) in his office with plan:  [phone], opt 1.

## 2011-12-14 ENCOUNTER — Encounter (HOSPITAL_COMMUNITY): Payer: Self-pay

## 2011-12-14 ENCOUNTER — Ambulatory Visit (INDEPENDENT_AMBULATORY_CARE_PROVIDER_SITE_OTHER): Payer: BC Managed Care – PPO | Admitting: Surgery

## 2011-12-14 ENCOUNTER — Encounter (HOSPITAL_COMMUNITY)
Admission: RE | Admit: 2011-12-14 | Discharge: 2011-12-14 | Disposition: A | Discharge status: Home / Self Care | Payer: BC Managed Care – PPO | Source: Ambulatory Visit | Attending: General Surgery | Admitting: General Surgery

## 2011-12-14 HISTORY — DX: Depression, unspecified: F32.A

## 2011-12-14 HISTORY — DX: Anxiety disorder, unspecified: F41.9

## 2011-12-14 HISTORY — DX: Other specified postprocedural states: R11.2

## 2011-12-14 HISTORY — DX: Anemia, unspecified: D64.9

## 2011-12-14 HISTORY — DX: Nausea with vomiting, unspecified: R11.2

## 2011-12-14 HISTORY — DX: Other specified postprocedural states: Z98.890

## 2011-12-14 HISTORY — DX: Major depressive disorder, single episode, unspecified: F32.9

## 2011-12-14 LAB — DIFFERENTIAL
Eosinophils Absolute: 0.3 10*3/uL (ref 0.0–0.7)
Eosinophils Relative: 3 % (ref 0–5)
Lymphocytes Relative: 25 % (ref 12–46)
Lymphs Abs: 3 10*3/uL (ref 0.7–4.0)
Monocytes Relative: 8 % (ref 3–12)
Neutrophils Relative %: 64 % (ref 43–77)

## 2011-12-14 LAB — BASIC METABOLIC PANEL
BUN: 4 mg/dL — ABNORMAL LOW (ref 6–23)
CO2: 27 mEq/L (ref 19–32)
GFR calc non Af Amer: >90 mL/min (ref >90)
Glucose, Bld: 85 mg/dL (ref 70–99)
Potassium: 3.4 mEq/L — ABNORMAL LOW (ref 3.5–5.1)
Sodium: 132 mEq/L — ABNORMAL LOW (ref 135–145)

## 2011-12-14 LAB — HEPATIC FUNCTION PANEL
AST: 21 U/L (ref 0–37)
Albumin: 4.6 g/dL (ref 3.5–5.2)
Alkaline Phosphatase: 76 U/L (ref 39–117)
Total Bilirubin: 0.4 mg/dL (ref 0.3–1.2)

## 2011-12-14 LAB — CBC
Hemoglobin: 13.5 g/dL (ref 12.0–15.0)
MCH: 30.5 pg (ref 26.0–34.0)
MCV: 87.6 fL (ref 78.0–100.0)
RBC: 4.43 MIL/uL (ref 3.87–5.11)
WBC: 11.7 10*3/uL — ABNORMAL HIGH (ref 4.0–10.5)

## 2011-12-14 LAB — SURGICAL PCR SCREEN: Staphylococcus aureus: NEGATIVE

## 2011-12-14 NOTE — Patient Instructions (Addendum)
20 Alicia Owens  12/14/2011   Your procedure is scheduled on:  12/15/2011  Report to [facility] at 1100  AM.  Call this number if you have problems the morning of surgery: [phone]   Remember:   Do not eat food:After Midnight.  May have clear liquids:until Midnight .  Clear liquids include soda, tea, black coffee, apple or grape juice, broth.  Take these medicines the morning of surgery with A SIP OF WATER: xanax,lotensin,flexaril,lisiniopril,prilosec,zofran,zantac   Do not wear jewelry, make-up or nail polish.  Do not wear lotions, powders, or perfumes. You may wear deodorant.  Do not shave 48 hours prior to surgery.  Do not bring valuables to the hospital.  Contacts, dentures or bridgework may not be worn into surgery.  Leave suitcase in the car. After surgery it may be brought to your room.  For patients admitted to the hospital, checkout time is 11:00 AM the day of discharge.   Patients discharged the day of surgery will not be allowed to drive home.  Name and phone number of your driver: family  Special Instructions: CHG Shower Use Special Wash: 1/2 bottle night before surgery and 1/2 bottle morning of surgery.   Please read over the following fact sheets that you were given: Pain Booklet, MRSA Information, Surgical Site Infection Prevention and Anesthesia Post-op Instructions Laparoscopic Cholecystectomy Laparoscopic cholecystectomy is surgery to remove the gallbladder. The gallbladder is located slightly to the right of center in the abdomen, behind the liver. It is a concentrating and storage sac for the bile produced in the liver. Bile aids in the digestion and absorption of fats. Gallbladder disease (cholecystitis) is an inflammation of your gallbladder. This condition is usually caused by a buildup of gallstones (cholelithiasis) in your gallbladder. Gallstones can block the flow of bile, resulting in inflammation and pain. In severe cases, emergency surgery may be required. When  emergency surgery is not required, you will have time to prepare for the procedure. Laparoscopic surgery is an alternative to open surgery. Laparoscopic surgery usually has a shorter recovery time. Your common bile duct may also need to be examined and explored. Your caregiver will discuss this with you if he or she feels this should be done. If stones are found in the common bile duct, they may be removed. LET YOUR CAREGIVER KNOW ABOUT:  Allergies to food or medicine.   Medicines taken, including vitamins, herbs, eyedrops, over-the-counter medicines, and creams.   Use of steroids (by mouth or creams).   Previous problems with anesthetics or numbing medicines.   History of bleeding problems or blood clots.   Previous surgery.   Other health problems, including diabetes and kidney problems.   Possibility of pregnancy, if this applies.  RISKS AND COMPLICATIONS All surgery is associated with risks. Some problems that may occur following this procedure include:  Infection.   Damage to the common bile duct, nerves, arteries, veins, or other internal organs such as the stomach or intestines.   Bleeding.   A stone may remain in the common bile duct.  BEFORE THE PROCEDURE  Do not take aspirin for 3 days prior to surgery or blood thinners for 1 week prior to surgery.   Do not eat or drink anything after midnight the night before surgery.   Let your caregiver know if you develop a cold or other infectious problem prior to surgery.   You should be present 60 minutes before the procedure or as directed.  PROCEDURE  You will be given  medicine that makes you sleep (general anesthetic). When you are asleep, your surgeon will make several small cuts (incisions) in your abdomen. One of these incisions is used to insert a small, lighted scope (laparoscope) into the abdomen. The laparoscope helps the surgeon see into your abdomen. Carbon dioxide gas will be pumped into your abdomen. The gas  allows more room for the surgeon to perform your surgery. Other operating instruments are inserted through the other incisions. Laparoscopic procedures may not be appropriate when:  There is major scarring from previous surgery.   The gallbladder is extremely inflamed.   There are bleeding disorders or unexpected cirrhosis of the liver.   A pregnancy is near term.   Other conditions make the laparoscopic procedure impossible.  If your surgeon feels it is not safe to continue with a laparoscopic procedure, he or she will perform an open abdominal procedure. In this case, the surgeon will make an incision to open the abdomen. This gives the surgeon a larger view and field to work within. This may allow the surgeon to perform procedures that sometimes cannot be performed with a laparoscope alone. Open surgery has a longer recovery time. AFTER THE PROCEDURE  You will be taken to the recovery area where a nurse will watch and check your progress.   You may be allowed to go home the same day.   Do not resume physical activities until directed by your caregiver.   You may resume a normal diet and activities as directed.  Document Released: 08/21/2005 Document Revised: 08/10/2011 Document Reviewed: 02/03/2011 [facility] Patient Information 2012 [place], [place].PATIENT INSTRUCTIONS POST-ANESTHESIA  IMMEDIATELY FOLLOWING SURGERY:  Do not drive or operate machinery for the first twenty four hours after surgery.  Do not make any important decisions for twenty four hours after surgery or while taking narcotic pain medications or sedatives.  If you develop intractable nausea and vomiting or a severe headache please notify your doctor immediately.  FOLLOW-UP:  Please make an appointment with your surgeon as instructed. You do not need to follow up with anesthesia unless specifically instructed to do so.  WOUND CARE INSTRUCTIONS (if applicable):  Keep a dry clean dressing on the anesthesia/puncture wound  site if there is drainage.  Once the wound has quit draining you may leave it open to air.  Generally you should leave the bandage intact for twenty four hours unless there is drainage.  If the epidural site drains for more than 36-48 hours please call the anesthesia department.  QUESTIONS?:  Please feel free to call your physician or the hospital operator if you have any questions, and they will be happy to assist you.     [facility] Anesthesia Department [address] [place] [phone]

## 2011-12-15 ENCOUNTER — Encounter (HOSPITAL_COMMUNITY): Payer: Self-pay | Admitting: Anesthesiology

## 2011-12-15 ENCOUNTER — Encounter (HOSPITAL_COMMUNITY)
Admission: RE | Disposition: A | Discharge status: Home / Self Care | Payer: Self-pay | Source: Ambulatory Visit | Attending: General Surgery

## 2011-12-15 ENCOUNTER — Encounter (HOSPITAL_COMMUNITY): Payer: Self-pay | Admitting: *Deleted

## 2011-12-15 ENCOUNTER — Ambulatory Visit (HOSPITAL_COMMUNITY): Payer: BC Managed Care – PPO | Admitting: Anesthesiology

## 2011-12-15 ENCOUNTER — Ambulatory Visit (HOSPITAL_COMMUNITY)
Admission: RE | Admit: 2011-12-15 | Discharge: 2011-12-15 | Disposition: A | Discharge status: Home / Self Care | Payer: BC Managed Care – PPO | Source: Ambulatory Visit | Attending: General Surgery | Admitting: General Surgery

## 2011-12-15 DIAGNOSIS — Z01812 Encounter for preprocedural laboratory examination: Secondary | ICD-10-CM | POA: Insufficient documentation

## 2011-12-15 DIAGNOSIS — I10 Essential (primary) hypertension: Secondary | ICD-10-CM | POA: Insufficient documentation

## 2011-12-15 DIAGNOSIS — K811 Chronic cholecystitis: Secondary | ICD-10-CM | POA: Insufficient documentation

## 2011-12-15 DIAGNOSIS — Z79899 Other long term (current) drug therapy: Secondary | ICD-10-CM | POA: Insufficient documentation

## 2011-12-15 HISTORY — PX: CHOLECYSTECTOMY: SHX55

## 2011-12-15 SURGERY — LAPAROSCOPIC CHOLECYSTECTOMY
Anesthesia: General | Site: Abdomen | Wound class: Contaminated

## 2011-12-15 MED ORDER — ENOXAPARIN SODIUM 40 MG/0.4ML ~~LOC~~ SOLN
40.0000 mg | Freq: Once | SUBCUTANEOUS | Status: AC
  Administered 2011-12-15: 40 mg via SUBCUTANEOUS

## 2011-12-15 MED ORDER — ONDANSETRON HCL 4 MG/2ML IJ SOLN
INTRAMUSCULAR | Status: DC | PRN
  Administered 2011-12-15: 4 mg via INTRAVENOUS

## 2011-12-15 MED ORDER — GLYCOPYRROLATE 0.2 MG/ML IJ SOLN
INTRAMUSCULAR | Status: AC
  Filled 2011-12-15: qty 1

## 2011-12-15 MED ORDER — NEOSTIGMINE METHYLSULFATE 1 MG/ML IJ SOLN
INTRAMUSCULAR | Status: DC | PRN
  Administered 2011-12-15: 1 mg via INTRAVENOUS
  Administered 2011-12-15: 3 mg via INTRAVENOUS

## 2011-12-15 MED ORDER — MIDAZOLAM HCL 2 MG/2ML IJ SOLN
1.0000 mg | INTRAMUSCULAR | Status: DC | PRN
  Administered 2011-12-15: 2 mg via INTRAVENOUS

## 2011-12-15 MED ORDER — BUPIVACAINE HCL (PF) 0.5 % IJ SOLN
INTRAMUSCULAR | Status: AC
  Filled 2011-12-15: qty 30

## 2011-12-15 MED ORDER — DEXAMETHASONE SODIUM PHOSPHATE 4 MG/ML IJ SOLN
4.0000 mg | Freq: Once | INTRAMUSCULAR | Status: AC
  Administered 2011-12-15: 4 mg via INTRAVENOUS

## 2011-12-15 MED ORDER — ONDANSETRON HCL 4 MG/2ML IJ SOLN: 4.0000 mg | Freq: Once | INTRAMUSCULAR | Status: AC | PRN

## 2011-12-15 MED ORDER — MIDAZOLAM HCL 5 MG/5ML IJ SOLN
INTRAMUSCULAR | Status: DC | PRN
  Administered 2011-12-15: 2 mg via INTRAVENOUS

## 2011-12-15 MED ORDER — GLYCOPYRROLATE 0.2 MG/ML IJ SOLN
INTRAMUSCULAR | Status: DC | PRN
  Administered 2011-12-15: .2 mg via INTRAVENOUS
  Administered 2011-12-15: .4 mg via INTRAVENOUS

## 2011-12-15 MED ORDER — KETOROLAC TROMETHAMINE 30 MG/ML IJ SOLN
30.0000 mg | Freq: Once | INTRAMUSCULAR | Status: AC
  Administered 2011-12-15: 30 mg via INTRAVENOUS

## 2011-12-15 MED ORDER — ONDANSETRON HCL 4 MG/2ML IJ SOLN
INTRAMUSCULAR | Status: AC
  Administered 2011-12-15: 4 mg via INTRAVENOUS
  Filled 2011-12-15: qty 2

## 2011-12-15 MED ORDER — ONDANSETRON HCL 4 MG PO TABS: 8.0000 mg | ORAL_TABLET | Freq: Three times a day (TID) | ORAL | Status: DC | PRN

## 2011-12-15 MED ORDER — ONDANSETRON HCL 4 MG/2ML IJ SOLN
INTRAMUSCULAR | Status: AC
  Filled 2011-12-15: qty 2

## 2011-12-15 MED ORDER — SUCCINYLCHOLINE CHLORIDE 20 MG/ML IJ SOLN
INTRAMUSCULAR | Status: DC | PRN
  Administered 2011-12-15: 100 mg via INTRAVENOUS

## 2011-12-15 MED ORDER — GLYCOPYRROLATE 0.2 MG/ML IJ SOLN: 0.2000 mg | Freq: Once | INTRAMUSCULAR | Status: DC

## 2011-12-15 MED ORDER — SODIUM CHLORIDE 0.9 % IR SOLN
Status: DC | PRN
  Administered 2011-12-15: 3000 mL

## 2011-12-15 MED ORDER — FENTANYL CITRATE 0.05 MG/ML IJ SOLN
INTRAMUSCULAR | Status: AC
  Filled 2011-12-15: qty 2

## 2011-12-15 MED ORDER — DEXAMETHASONE SODIUM PHOSPHATE 4 MG/ML IJ SOLN
INTRAMUSCULAR | Status: AC
  Administered 2011-12-15: 4 mg via INTRAVENOUS
  Filled 2011-12-15: qty 1

## 2011-12-15 MED ORDER — CEFAZOLIN SODIUM 1-5 GM-% IV SOLN: 1.0000 g | INTRAVENOUS | Status: DC

## 2011-12-15 MED ORDER — EPHEDRINE SULFATE 50 MG/ML IJ SOLN
INTRAMUSCULAR | Status: DC | PRN
  Administered 2011-12-15: 10 mg via INTRAVENOUS
  Administered 2011-12-15: 5 mg via INTRAVENOUS

## 2011-12-15 MED ORDER — FENTANYL CITRATE 0.05 MG/ML IJ SOLN
INTRAMUSCULAR | Status: DC | PRN
  Administered 2011-12-15: 150 ug via INTRAVENOUS
  Administered 2011-12-15: 50 ug via INTRAVENOUS

## 2011-12-15 MED ORDER — ACETAMINOPHEN 325 MG PO TABS: 325.0000 mg | ORAL_TABLET | ORAL | Status: DC | PRN

## 2011-12-15 MED ORDER — ONDANSETRON HCL 4 MG/2ML IJ SOLN
4.0000 mg | Freq: Once | INTRAMUSCULAR | Status: AC
  Administered 2011-12-15: 4 mg via INTRAVENOUS

## 2011-12-15 MED ORDER — CEFAZOLIN SODIUM 1-5 GM-% IV SOLN
INTRAVENOUS | Status: AC
  Administered 2011-12-15: 1 g via INTRAVENOUS
  Filled 2011-12-15: qty 50

## 2011-12-15 MED ORDER — FENTANYL CITRATE 0.05 MG/ML IJ SOLN
25.0000 ug | INTRAMUSCULAR | Status: DC | PRN
  Administered 2011-12-15: 25 ug via INTRAVENOUS
  Administered 2011-12-15: 50 ug via INTRAVENOUS
  Administered 2011-12-15: 25 ug via INTRAVENOUS
  Administered 2011-12-15: 50 ug via INTRAVENOUS

## 2011-12-15 MED ORDER — MIDAZOLAM HCL 2 MG/2ML IJ SOLN
INTRAMUSCULAR | Status: AC
  Administered 2011-12-15: 2 mg via INTRAVENOUS
  Filled 2011-12-15: qty 2

## 2011-12-15 MED ORDER — OXYCODONE-ACETAMINOPHEN 7.5-325 MG PO TABS: 1.0000 | ORAL_TABLET | ORAL | Status: AC | PRN

## 2011-12-15 MED ORDER — ENOXAPARIN SODIUM 40 MG/0.4ML ~~LOC~~ SOLN
SUBCUTANEOUS | Status: AC
  Administered 2011-12-15: 40 mg via SUBCUTANEOUS
  Filled 2011-12-15: qty 0.4

## 2011-12-15 MED ORDER — PROPOFOL 10 MG/ML IV EMUL
INTRAVENOUS | Status: AC
  Filled 2011-12-15: qty 20

## 2011-12-15 MED ORDER — FENTANYL CITRATE 0.05 MG/ML IJ SOLN
INTRAMUSCULAR | Status: AC
  Filled 2011-12-15: qty 5

## 2011-12-15 MED ORDER — BUPIVACAINE HCL 0.5 % IJ SOLN
INTRAMUSCULAR | Status: DC | PRN
  Administered 2011-12-15: 20 mL

## 2011-12-15 MED ORDER — HEMOSTATIC AGENTS (NO CHARGE) OPTIME
TOPICAL | Status: DC | PRN
  Administered 2011-12-15: 1 via TOPICAL

## 2011-12-15 MED ORDER — ROCURONIUM BROMIDE 50 MG/5ML IV SOLN
INTRAVENOUS | Status: AC
  Filled 2011-12-15: qty 1

## 2011-12-15 MED ORDER — GLYCOPYRROLATE 0.2 MG/ML IJ SOLN
INTRAMUSCULAR | Status: AC
  Administered 2011-12-15: 0.2 mg via INTRAVENOUS
  Filled 2011-12-15: qty 1

## 2011-12-15 MED ORDER — MIDAZOLAM HCL 2 MG/2ML IJ SOLN
INTRAMUSCULAR | Status: AC
  Filled 2011-12-15: qty 2

## 2011-12-15 MED ORDER — LACTATED RINGERS IV SOLN
INTRAVENOUS | Status: DC
  Administered 2011-12-15: 1000 mL via INTRAVENOUS
  Administered 2011-12-15: 15:00:00 via INTRAVENOUS

## 2011-12-15 MED ORDER — ROCURONIUM BROMIDE 100 MG/10ML IV SOLN
INTRAVENOUS | Status: DC | PRN
  Administered 2011-12-15: 25 mg via INTRAVENOUS

## 2011-12-15 MED ORDER — LACTATED RINGERS IV SOLN
INTRAVENOUS | Status: DC | PRN
  Administered 2011-12-15 (×2): via INTRAVENOUS

## 2011-12-15 MED ORDER — KETOROLAC TROMETHAMINE 30 MG/ML IJ SOLN
INTRAMUSCULAR | Status: AC
  Filled 2011-12-15: qty 1

## 2011-12-15 MED ORDER — FENTANYL CITRATE 0.05 MG/ML IJ SOLN: 25.0000 ug | INTRAMUSCULAR | Status: DC | PRN

## 2011-12-15 MED ORDER — GLYCOPYRROLATE 0.2 MG/ML IJ SOLN
0.2000 mg | Freq: Once | INTRAMUSCULAR | Status: AC
  Administered 2011-12-15: 0.2 mg via INTRAVENOUS

## 2011-12-15 MED ORDER — NEOSTIGMINE METHYLSULFATE 1 MG/ML IJ SOLN
INTRAMUSCULAR | Status: AC
  Filled 2011-12-15: qty 10

## 2011-12-15 MED ORDER — PROPOFOL 10 MG/ML IV EMUL
INTRAVENOUS | Status: DC | PRN
  Administered 2011-12-15: 200 mg via INTRAVENOUS

## 2011-12-15 MED ORDER — DEXAMETHASONE SODIUM PHOSPHATE 4 MG/ML IJ SOLN: 4.0000 mg | INTRAMUSCULAR | Status: DC

## 2011-12-15 SURGICAL SUPPLY — 38 items
APPLIER CLIP ROT 10 11.4 M/L (STAPLE) ×2
APR CLP MED LRG 11.4X10 (STAPLE) ×1
BAG HAMPER (MISCELLANEOUS) ×2 IMPLANT
BAG SPEC RTRVL LRG 6X4 10 (ENDOMECHANICALS) ×1
CLIP APPLIE ROT 10 11.4 M/L (STAPLE) ×1 IMPLANT
CLOTH BEACON ORANGE TIMEOUT ST (SAFETY) ×2 IMPLANT
COVER SURGICAL LIGHT HANDLE (MISCELLANEOUS) ×4 IMPLANT
DECANTER SPIKE VIAL GLASS SM (MISCELLANEOUS) ×2 IMPLANT
DURAPREP 26ML APPLICATOR (WOUND CARE) ×2 IMPLANT
ELECT REM PT RETURN 9FT ADLT (ELECTROSURGICAL) ×2
ELECTRODE REM PT RTRN 9FT ADLT (ELECTROSURGICAL) ×1 IMPLANT
FILTER SMOKE EVAC LAPAROSHD (FILTER) ×2 IMPLANT
FORMALIN 10 PREFIL 120ML (MISCELLANEOUS) ×2 IMPLANT
GLOVE BIO SURGEON STRL SZ7.5 (GLOVE) ×2 IMPLANT
GLOVE BIOGEL PI IND STRL 7.0 (GLOVE) IMPLANT
GLOVE BIOGEL PI IND STRL 7.5 (GLOVE) IMPLANT
GLOVE BIOGEL PI INDICATOR 7.0 (GLOVE) ×1
GLOVE BIOGEL PI INDICATOR 7.5 (GLOVE) ×1
GLOVE ECLIPSE 6.5 STRL STRAW (GLOVE) ×1 IMPLANT
GLOVE ECLIPSE 7.0 STRL STRAW (GLOVE) ×1 IMPLANT
GLOVE EXAM NITRILE MD LF STRL (GLOVE) ×1 IMPLANT
GOWN STRL REIN XL XLG (GOWN DISPOSABLE) ×6 IMPLANT
INST SET LAPROSCOPIC AP (KITS) ×2 IMPLANT
IV NS IRRIG 3000ML ARTHROMATIC (IV SOLUTION) ×1 IMPLANT
KIT ROOM TURNOVER APOR (KITS) ×2 IMPLANT
KIT TROCAR LAP CHOLE (TROCAR) ×2 IMPLANT
MANIFOLD NEPTUNE II (INSTRUMENTS) ×2 IMPLANT
NS IRRIG 1000ML POUR BTL (IV SOLUTION) ×2 IMPLANT
PACK LAP CHOLE LZT030E (CUSTOM PROCEDURE TRAY) ×2 IMPLANT
PAD ARMBOARD 7.5X6 YLW CONV (MISCELLANEOUS) ×2 IMPLANT
POUCH SPECIMEN RETRIEVAL 10MM (ENDOMECHANICALS) ×2 IMPLANT
SET BASIN LINEN APH (SET/KITS/TRAYS/PACK) ×2 IMPLANT
SET TUBE IRRIG SUCTION NO TIP (IRRIGATION / IRRIGATOR) ×1 IMPLANT
SPONGE GAUZE 2X2 8PLY STRL LF (GAUZE/BANDAGES/DRESSINGS) ×8 IMPLANT
STAPLER VISISTAT (STAPLE) ×2 IMPLANT
SUT VICRYL 0 UR6 27IN ABS (SUTURE) ×2 IMPLANT
WARMER LAPAROSCOPE (MISCELLANEOUS) ×2 IMPLANT
YANKAUER SUCT 12FT TUBE ARGYLE (SUCTIONS) ×2 IMPLANT

## 2011-12-15 NOTE — Interval H&P Note (Signed)
History and Physical Interval Note:  12/15/2011 12:38 PM  Alicia Owens  has presented today for surgery, with the diagnosis of Biliary dyskinesia  The various methods of treatment have been discussed with the patient and family. After consideration of risks, benefits and other options for treatment, the patient has consented to  Procedure(s) (LRB): LAPAROSCOPIC CHOLECYSTECTOMY (N/A) as a surgical intervention .  The patients' history has been reviewed, patient examined, no change in status, stable for surgery.  I have reviewed the patients' chart and labs.  Questions were answered to the patient's satisfaction.     Franky Macho A

## 2011-12-15 NOTE — Anesthesia Postprocedure Evaluation (Signed)
  Anesthesia Post-op Note  Patient: Alicia Owens  Procedure(s) Performed: Procedure(s) (LRB): LAPAROSCOPIC CHOLECYSTECTOMY (N/A)  Patient Location: PACU  Anesthesia Type: General  Level of Consciousness: awake, alert  and oriented  Airway and Oxygen Therapy: Patient Spontanous Breathing and Patient connected to nasal cannula oxygen  Post-op Pain: mild  Post-op Assessment: Post-op Vital signs reviewed, Patient's Cardiovascular Status Stable, Respiratory Function Stable, Patent Airway and Pain level controlled  Post-op Vital Signs: Reviewed and stable  Complications: No apparent anesthesia complications

## 2011-12-15 NOTE — Op Note (Signed)
Patient:  Alicia Owens  DOB:  15-May-1976  MRN:  [id number]   Preop Diagnosis:  Chronic cholecystitis  Postop Diagnosis:  Same  Procedure:  Laparoscopic cholecystectomy  Surgeon:  Franky Macho, M.D.  Anes:  General endotracheal  Indications:  Patient is a 36 year old white female who presents with biliary colic secondary to chronic cholecystitis. The risks and benefits of the procedure including bleeding, infection, hepatobiliary, and a possibly of an open procedure were fully explained patient, gave informed consent.  Procedure note:  Patient is placed the supine position. After induction of general endotracheal anesthesia, the abdomen was prepped and draped using usual sterile technique with DuraPrep. Surgical site confirmation was performed.  An infraumbilical incision was made down to the fascia. A Veress needle was introduced into the abdominal cavity and confirmation of placement was done using the saline drop test. The abdomen was then insufflated to 16 mm mercury pressure. An 11 mm trocar was introduced into the abdominal cavity under direct visualization without difficulty. The patient was placed in reverse Trendelenburg position additional 11 mm trocar was placed the epigastric region 5 mm trochars were placed the right upper quadrant and right flank regions. Liver was inspected and noted within normal limits. The gallbladder was retracted superior laterally. The dissection was begun around the infundibulum of the gallbladder. The cystic duct was first identified. Its junction to the infundibulum flow identified. Endoclips placed proximally distally on the cystic duct and cystic duct was divided. The cystic artery was likewise ligated and divided. The gallbladder is free within the gallbladder fossa using Bovie electrocautery. There was some spillage of bile and this was evacuated without difficulty. The gallbladder was delivered through the epigastric trocar site using an Endo Catch  bag. The gallbladder fossa was inspected and no abnormal bleeding or bile leakage was noted. Surgicel is placed the gallbladder fossa. All fluid and air were then evacuated from the abdominal cavity prior to the removal of the trochars.  All wounds were irrigated with normal saline. All wounds were injected with 0.5% Sensorcaine. The infraumbilical fascia was reapproximated using 0 Vicryl interrupted suture. All skin incisions were closed using staples. Betadine ointment dorsal dressings were applied.  All tape and needle counts were correct the end of the procedure. Patient was extubated in the operating room back recovery room awake in stable condition.  Complications:  None   EBL:  Minimal  Specimen:  Gallbladder

## 2011-12-15 NOTE — H&P (Signed)
  NTS SOAP Note  Vital Signs:  Vitals as of: 12/14/2011: Systolic 158: Diastolic 108: Heart Rate 102: Temp 98.42F: Height 44ft 1.5in: Weight 141Lbs 0 Ounces: OFC 0in: Respiratory Rate 0: O2 Saturation 0: Pain Level 10: BMI 26  BMI : 26.21 kg/m2  Subjective: This 68 Years 60 Months old Female presents forof Epigastric abdominal pain and persistent nausea.  Patient states several days of continuous epigastric and right upper quadrant abdominal pain. Appetite is decreased. She has had associated nausea and vomiting. She has had a long-standing workup and GI intervention. She has had both upper endoscopy and previous biliary workup. Her most recent hiatus scan demonstrated decreased ejection fracture and some exacerbation of her symptomatology. She denies any jaundice. No significant changes in her symptoms during her pregnancies.  Review of Symptoms:  Constitutional:unremarkable Migraines Eyes:unremarkable Rhinorrhea Cardiovascular:unremarkable Respiratory:unremarkable As per history of present illness Genitourinary:unremarkable Musculoskeletal:unremarkable Skin:unremarkable Breast:unremarkable Hematolgic/Lymphatic:unremarkable Allergic/Immunologic:unremarkable   Past Medical History:Obtained   Past Medical History  Pregnancy Gravida: 2 Pregnancy Para: 2 Surgical History: c-section, bilateral breast reduction, diagnostic laparoscopy and appendectomy 2011 ([facility]) Medical Problems: GERD, mild hypertension Psychiatric History: anxiety Allergies: codeine, sulfa, Phenergan Medications: omeprazole, lisinopril, ondansetron, hydrochlorothiazide, sertraline, ibuprofen, dicyclomine, cyclobenzaprine, alprazolam, sumatriptan   Social History:Obtained  Social History  Preferred Language: English ([place]) Race:  White Ethnicity: Not Hispanic / Latino Age: 36 Years 8 Months Marital Status:  M Alcohol: None Recreational drug(s):  none   Smoking Status: Never smoker reviewed on 12/15/2011  Family History:Obtained   Family History  Is there a family history AV:[id number], family history of biliary disease.    Objective Information: General:Well appearing, well nourished in no distress.Tearful, anxious Skin:no rash or prominent lesions Head:Atraumatic; no masses; no abnormalities Eyes:conjunctiva clear, EOM intact, PERRL Mouth:Mucous membranes moist, no mucosal lesions. Neck:Supple without lymphadenopathy.  Heart:RRR, no murmur Lungs:CTA bilaterally, no wheezes, rhonchi, rales.  Breathing unlabored. Abdomen:Soft, ND, no HSM, no masses.Moderate to severe right upper quadrant and epigastric abdominal tenderness. Positive Murphy sign. Extremities:No deformities, clubbing, cyanosis, or edema.      HIDA scan: Decrease gallbladder ejection fraction of 1%. Some symptomatology. Assessment:  Diagnosis &amp; Procedure: DiagnosisCode: 575.8, ProcedureCode: 95621,    Plan: Biliary dyskinesia. Surgical options were discussed at length the patient. Risks benefits and alternatives of laparoscopic possible open cholecystectomy were discussed at length. timing issues were discussed. Due to scheduling and availability of Dr. Lovell Sheehan with patient and Dr. Lovell Sheehan are agreeable to proceeding tomorrow with planned surgery.  Patient Education:Alternative treatments to surgery were discussed with patient (and family).Risks and benefits  of procedure were fully explained to the patient (and family) who gave informed consent. Patient/family questions were addressed.  Follow-up:Pending Surgery

## 2011-12-15 NOTE — Transfer of Care (Signed)
Immediate Anesthesia Transfer of Care Note  Patient: Alicia Owens  Procedure(s) Performed: Procedure(s) (LRB): LAPAROSCOPIC CHOLECYSTECTOMY (N/A)  Patient Location: PACU  Anesthesia Type: General  Level of Consciousness: awake and alert   Airway & Oxygen Therapy: Patient Spontanous Breathing and Patient connected to nasal cannula oxygen  Post-op Assessment: Report given to PACU RN and Post -op Vital signs reviewed and stable  Post vital signs: Reviewed and stable  Complications: No apparent anesthesia complications

## 2011-12-15 NOTE — Discharge Instructions (Signed)
Laparoscopic Cholecystectomy Care After Refer to this sheet in the next few weeks. These instructions provide you with information on caring for yourself after your procedure. Your caregiver may also give you more specific instructions. Your treatment has been planned according to current medical practices, but problems sometimes occur. Call your caregiver if you have any problems or questions after your procedure. HOME CARE INSTRUCTIONS   Change bandages (dressings) as directed by your caregiver.   Keep the wound dry and clean. The wound may be washed gently with soap and water. Gently blot or dab the area dry.   Do not take baths or use swimming pools or hot tubs for 10 days, or as instructed by your caregiver.   Only take over-the-counter or prescription medicines for pain, discomfort, or fever as directed by your caregiver.   Continue your normal diet as directed by your caregiver.   Do not lift anything heavier than 25 pounds (11.5 kg), or as directed by your caregiver.   Do not play contact sports for 1 week, or as directed by your caregiver.  SEEK MEDICAL CARE IF:   There is redness, swelling, or increasing pain in the wound.   You notice yellowish-white fluid (pus) coming from the wound.   There is drainage from the wound that lasts longer than 1 day.   There is a bad smell coming from the wound or dressing.   The surgical cut (incision) breaks open.  SEEK IMMEDIATE MEDICAL CARE IF:   You develop a rash.   You have difficulty breathing.   You develop chest pain.   You develop any reaction or side effects to medicines given.   You have a fever.   You have increasing pain in the shoulders (shoulder strap areas).   You have dizzy episodes or faint while standing.   You develop severe abdominal pain.   You feel sick to your stomach (nauseous) or throw up (vomit) and this lasts for more than 1 day.  MAKE SURE YOU:   Understand these instructions.   Will watch  your condition.   Will get help right away if you are not doing well or get worse.  Document Released: 08/21/2005 Document Revised: 08/10/2011 Document Reviewed: 02/03/2011 [employer] Patient Information 2012 [employer]. 

## 2011-12-15 NOTE — Preoperative (Signed)
Beta Blockers   Reason not to administer Beta Blockers:Not Applicable 

## 2011-12-15 NOTE — Anesthesia Preprocedure Evaluation (Signed)
Anesthesia Evaluation  Patient identified by MRN, date of birth, ID band Patient awake    Reviewed: Allergy & Precautions, H&P , NPO status , Patient's Chart, lab work & pertinent test results  History of Anesthesia Complications (+) PONV  Airway Mallampati: I TM Distance: >3 FB Neck ROM: Full    Dental No notable dental hx. (+) Teeth Intact   Pulmonary neg pulmonary ROS,    Pulmonary exam normal       Cardiovascular hypertension, Pt. on medications Rhythm:Regular Rate:Normal     Neuro/Psych  Headaches, PSYCHIATRIC DISORDERS Anxiety Depression    GI/Hepatic Neg liver ROS, GERD-  Medicated and Controlled,  Endo/Other  Hyperthyroidism   Renal/GU negative Renal ROS     Musculoskeletal negative musculoskeletal ROS (+)   Abdominal Normal abdominal exam  (+)   Peds  Hematology negative hematology ROS (+)   Anesthesia Other Findings   Reproductive/Obstetrics negative OB ROS                           Anesthesia Physical Anesthesia Plan  ASA: II  Anesthesia Plan: General   Post-op Pain Management:    Induction: Intravenous, Rapid sequence and Cricoid pressure planned  Airway Management Planned: Oral ETT  Additional Equipment:   Intra-op Plan:   Post-operative Plan: Extubation in OR  Informed Consent: I have reviewed the patients History and Physical, chart, labs and discussed the procedure including the risks, benefits and alternatives for the proposed anesthesia with the patient or authorized representative who has indicated his/her understanding and acceptance.     Plan Discussed with: CRNA  Anesthesia Plan Comments:         Anesthesia Quick Evaluation

## 2011-12-20 ENCOUNTER — Encounter (HOSPITAL_COMMUNITY): Payer: Self-pay | Admitting: General Surgery

## 2011-12-20 ENCOUNTER — Encounter (INDEPENDENT_AMBULATORY_CARE_PROVIDER_SITE_OTHER): Payer: Self-pay

## 2011-12-25 ENCOUNTER — Ambulatory Visit (INDEPENDENT_AMBULATORY_CARE_PROVIDER_SITE_OTHER): Payer: BC Managed Care – PPO | Admitting: General Surgery

## 2011-12-28 ENCOUNTER — Ambulatory Visit (INDEPENDENT_AMBULATORY_CARE_PROVIDER_SITE_OTHER): Payer: BC Managed Care – PPO | Admitting: General Surgery

## 2012-01-02 ENCOUNTER — Encounter (INDEPENDENT_AMBULATORY_CARE_PROVIDER_SITE_OTHER): Payer: Self-pay | Admitting: General Surgery

## 2012-10-21 NOTE — Telephone Encounter (Signed)
This message is appearing one year later, yet another problem with epic

## 2012-11-19 ENCOUNTER — Ambulatory Visit: Payer: Self-pay | Admitting: Family Medicine

## 2012-12-11 ENCOUNTER — Other Ambulatory Visit: Payer: Self-pay | Admitting: Family Medicine

## 2012-12-17 ENCOUNTER — Ambulatory Visit (INDEPENDENT_AMBULATORY_CARE_PROVIDER_SITE_OTHER): Payer: BC Managed Care – PPO | Admitting: Family Medicine

## 2012-12-17 ENCOUNTER — Encounter: Payer: Self-pay | Admitting: Family Medicine

## 2012-12-17 VITALS — BP 132/87 | Temp 98.3°F | Wt 181.2 lb

## 2012-12-17 DIAGNOSIS — J019 Acute sinusitis, unspecified: Secondary | ICD-10-CM

## 2012-12-17 DIAGNOSIS — G43909 Migraine, unspecified, not intractable, without status migrainosus: Secondary | ICD-10-CM | POA: Insufficient documentation

## 2012-12-17 MED ORDER — TOPIRAMATE 50 MG PO TABS: 50.0000 mg | ORAL_TABLET | Freq: Two times a day (BID) | ORAL | Status: DC

## 2012-12-17 MED ORDER — SUMATRIPTAN SUCCINATE 100 MG PO TABS: ORAL_TABLET | ORAL | Status: DC

## 2012-12-17 MED ORDER — CEFPROZIL 500 MG PO TABS: 500.0000 mg | ORAL_TABLET | Freq: Two times a day (BID) | ORAL | Status: AC

## 2012-12-17 NOTE — Patient Instructions (Signed)

## 2012-12-17 NOTE — Progress Notes (Signed)
  Subjective:    Patient ID: Alicia Owens, female    DOB: [birth year], 37 y.o.   MRN: [id number]  Sinusitis This is a new problem. The current episode started in the past 7 days. The problem has been gradually worsening since onset. There has been no fever. Associated symptoms include congestion, headaches and a sore throat. Past treatments include oral decongestants.   In addition to this patient has been having migraines approximately 4 or 5 per minute she would like to try preventative medicine so she does not have to uses much Imitrex. She denies any excessive stressors not depressed. Is compliant with her medicines   Review of Systems  HENT: Positive for congestion and sore throat.   Neurological: Positive for headaches.   Past medical history family history reviewed patient does not smoke    Objective:   Physical Exam Blood pressure good vital signs noted lungs clear heart regular neck no masses eardrums normal mild sinus tenderness       Assessment & Plan:  Allergic rhinitis S. Allergy medicine as directed Sinusitis-antibiotics prescribed Migraines-Topamax 50 mg twice daily if not significant improvement in the next month may need to increase the dose followup within 3 months

## 2013-01-20 ENCOUNTER — Telehealth: Payer: Self-pay | Admitting: Family Medicine

## 2013-01-20 ENCOUNTER — Other Ambulatory Visit: Payer: Self-pay | Admitting: *Deleted

## 2013-01-20 MED ORDER — LEVOFLOXACIN 500 MG PO TABS: 500.0000 mg | ORAL_TABLET | Freq: Every day | ORAL | Status: AC

## 2013-01-20 NOTE — Telephone Encounter (Signed)
levaquin sent to [facility]. Pt  Notified on her voicemail.

## 2013-01-20 NOTE — Telephone Encounter (Signed)
In this situation, since I just saw her for similar complaint, it is okay to call in Levaquin 500 mg one daily for the next 10 days. If she has ongoing symptoms she will need to be seen.

## 2013-01-20 NOTE — Telephone Encounter (Signed)
Seen Dr. Lorin Picket about 4 weeks ago for Sinus infection.  Completed antibiotic symptoms were gone for a couple of weeks.  She now has pain pressure in sinus area, discoloration in mucus.  Wants to know if Dr. Lorin Picket can call in an antibiotic?  [facility]

## 2013-03-24 ENCOUNTER — Other Ambulatory Visit: Payer: Self-pay | Admitting: Family Medicine

## 2013-03-26 ENCOUNTER — Ambulatory Visit (INDEPENDENT_AMBULATORY_CARE_PROVIDER_SITE_OTHER): Payer: BC Managed Care – PPO | Admitting: Nurse Practitioner

## 2013-03-26 ENCOUNTER — Encounter: Payer: Self-pay | Admitting: Nurse Practitioner

## 2013-03-26 VITALS — BP 110/74 | Temp 98.1°F | Wt 178.0 lb

## 2013-03-26 DIAGNOSIS — L739 Follicular disorder, unspecified: Secondary | ICD-10-CM

## 2013-03-26 DIAGNOSIS — L738 Other specified follicular disorders: Secondary | ICD-10-CM

## 2013-03-26 DIAGNOSIS — J209 Acute bronchitis, unspecified: Secondary | ICD-10-CM

## 2013-03-26 DIAGNOSIS — J069 Acute upper respiratory infection, unspecified: Secondary | ICD-10-CM

## 2013-03-26 MED ORDER — BENZONATATE 100 MG PO CAPS: ORAL_CAPSULE | ORAL | Status: DC

## 2013-03-26 MED ORDER — HYDROCODONE-HOMATROPINE 5-1.5 MG/5ML PO SYRP: 5.0000 mL | ORAL_SOLUTION | ORAL | Status: DC | PRN

## 2013-03-26 MED ORDER — AMOXICILLIN-POT CLAVULANATE 875-125 MG PO TABS: 1.0000 | ORAL_TABLET | Freq: Two times a day (BID) | ORAL | Status: DC

## 2013-03-27 NOTE — Progress Notes (Signed)
Subjective:  Presents complaints of cough and congestion over the past 10 days. Frequent mainly nonproductive cough, occasionally producing green-yellow sputum. Chest tightness with deep breath or cough. Head congestion. Sinus pressure/headache. No wheezing. Symptoms are worse in the morning. Slight shortness of breath. Nonsmoker. No fever. No vomiting diarrhea or abdominal pain. Taking fluids well. At end of visit mentions a slight rash on the top of both breast that is been there for while, discussed this with her gynecologist who told her to use hydrocortisone cream, no improvement. Nonpruritic. Minimally tender.  Objective:   BP 110/74  Temp(Src) 98.1 F (36.7 C) (Oral)  Wt 178 lb (80.74 kg)  BMI 33.09 kg/m2 NAD. Alert, oriented. TMs retracted, no erythema. Pharynx mildly injected with green PND noted. Neck supple with mild soft adenopathy. Lungs scattered expiratory crackles posterior, clear anterior. No wheezing or tachypnea. Normal color. Slight shortness of breath with talking. Several discrete pink papular lesions noted on the upper part of the left breast in various stages of healing. One small flat pustular lesion noted. Nontender to palpation.  Assessment:Acute upper respiratory infection  Acute bronchitis  Folliculitis   Plan: Meds ordered this encounter  Medications  . amoxicillin-clavulanate (AUGMENTIN) 875-125 MG per tablet    Sig: Take 1 tablet by mouth 2 (two) times daily.    Dispense:  20 tablet    Refill:  0    Order Specific Question:  Supervising Provider    Answer:  Merlyn Albert [[id number]]  . benzonatate (TESSALON) 100 MG capsule    Sig: 1-2 po TID prn cough    Dispense:  30 capsule    Refill:  0    Order Specific Question:  Supervising Provider    Answer:  Merlyn Albert [[id number]]  . HYDROcodone-homatropine (HYCODAN) 5-1.5 MG/5ML syrup    Sig: Take 5 mLs by mouth every 4 (four) hours as needed for cough.    Dispense:  120 mL    Refill:  0    Order Specific  Question:  Supervising Provider    Answer:  Merlyn Albert [[id number]]   Bring signs reviewed. Call back if symptoms worsen or persist. Patient defers albuterol inhaler at this time.

## 2013-03-28 ENCOUNTER — Encounter: Payer: Self-pay | Admitting: Nurse Practitioner

## 2013-04-17 ENCOUNTER — Other Ambulatory Visit: Payer: Self-pay | Admitting: Family Medicine

## 2013-05-14 ENCOUNTER — Ambulatory Visit (INDEPENDENT_AMBULATORY_CARE_PROVIDER_SITE_OTHER): Payer: BC Managed Care – PPO | Admitting: Family Medicine

## 2013-05-14 ENCOUNTER — Encounter: Payer: Self-pay | Admitting: Family Medicine

## 2013-05-14 VITALS — BP 132/86 | Ht 61.5 in | Wt 172.4 lb

## 2013-05-14 DIAGNOSIS — F341 Dysthymic disorder: Secondary | ICD-10-CM

## 2013-05-14 DIAGNOSIS — G43909 Migraine, unspecified, not intractable, without status migrainosus: Secondary | ICD-10-CM

## 2013-05-14 MED ORDER — BUPROPION HCL ER (SR) 150 MG PO TB12: 150.0000 mg | ORAL_TABLET | Freq: Two times a day (BID) | ORAL | Status: DC

## 2013-05-14 MED ORDER — ALPRAZOLAM 0.25 MG PO TABS: 0.2500 mg | ORAL_TABLET | Freq: Two times a day (BID) | ORAL | Status: DC | PRN

## 2013-05-14 NOTE — Patient Instructions (Signed)
wellbutrin start with one per night for 7 days then one 2 times a day  Follow up in 4 weeks

## 2013-05-14 NOTE — Progress Notes (Signed)
  Subjective:    Patient ID: Alicia Owens, female    DOB: 10/02/1975, 37 y.o.   MRN: [id number]  HPI  Patient arrives to follow up on anxiety and migraines.   Patient's migraines are stable. They occur relatively infrequently. Under good control currently. Stress levels are very high especially at work more as demanded of her than what she can do this causes quite a conflict. She's been very stressed anxiety spells in addition to this she finds herself at times tearful. She denies being suicidal not homicidal. Past medical history patient had a similar stress related issues a few years ago Review of Systems Patient times feels overwhelmed feels like her heart beating fast a slight tightness in her chest.    Objective:   Physical Exam  Lungs clear, heart is regular no murmurs, abdomen soft extremities no edema and warm dry  Blood pressure good 120/74    Assessment & Plan:  #1 stress-add Wellbutrin titrate up to twice daily Xanax as necessary recheck again in 4 weeks' time use Xanax sparingly cautioned drowsiness #2 migraines good control continue current measures 25-30 minutes was spent with patient discussing these issues

## 2013-06-20 ENCOUNTER — Other Ambulatory Visit: Payer: Self-pay | Admitting: Family Medicine

## 2013-06-20 ENCOUNTER — Other Ambulatory Visit: Payer: Self-pay | Admitting: *Deleted

## 2013-06-21 ENCOUNTER — Other Ambulatory Visit: Payer: Self-pay | Admitting: Family Medicine

## 2013-07-22 ENCOUNTER — Encounter: Payer: Self-pay | Admitting: Family Medicine

## 2013-07-22 ENCOUNTER — Ambulatory Visit (INDEPENDENT_AMBULATORY_CARE_PROVIDER_SITE_OTHER): Payer: BC Managed Care – PPO | Admitting: Family Medicine

## 2013-07-22 VITALS — BP 138/90 | Ht 61.5 in | Wt 166.6 lb

## 2013-07-22 DIAGNOSIS — F341 Dysthymic disorder: Secondary | ICD-10-CM

## 2013-07-22 DIAGNOSIS — J019 Acute sinusitis, unspecified: Secondary | ICD-10-CM

## 2013-07-22 DIAGNOSIS — R748 Abnormal levels of other serum enzymes: Secondary | ICD-10-CM

## 2013-07-22 DIAGNOSIS — Z0000 Encounter for general adult medical examination without abnormal findings: Secondary | ICD-10-CM

## 2013-07-22 DIAGNOSIS — K589 Irritable bowel syndrome without diarrhea: Secondary | ICD-10-CM | POA: Insufficient documentation

## 2013-07-22 DIAGNOSIS — G43909 Migraine, unspecified, not intractable, without status migrainosus: Secondary | ICD-10-CM

## 2013-07-22 DIAGNOSIS — Z23 Encounter for immunization: Secondary | ICD-10-CM

## 2013-07-22 DIAGNOSIS — K900 Celiac disease: Secondary | ICD-10-CM

## 2013-07-22 MED ORDER — TOPIRAMATE 50 MG PO TABS: 50.0000 mg | ORAL_TABLET | Freq: Two times a day (BID) | ORAL | Status: DC

## 2013-07-22 MED ORDER — CEFPROZIL 500 MG PO TABS: 500.0000 mg | ORAL_TABLET | Freq: Two times a day (BID) | ORAL | Status: DC

## 2013-07-22 MED ORDER — BUPROPION HCL ER (XL) 300 MG PO TB24: 300.0000 mg | ORAL_TABLET | Freq: Every day | ORAL | Status: DC

## 2013-07-22 NOTE — Progress Notes (Signed)
  Subjective:    Patient ID: CIARRAH RAE, female    DOB: 1976-06-05, 37 y.o.   MRN: [id number]  HPI Patient is here today for a f/u on Wellbutrin. She states she notices a difference. She does state that the medicine is helping with anxiety she would like to try the higher dose.  She would also like to address her sinus congestion that started Friday. Became worse, tightness, some wheeze, no N or V, no fever, she does relate some sinus pressure congestion tightness and pressure.  Pt would like the flu vaccine if that is OK with her sickness.  Some IBS symptoms-triggers-greasy foods, stress, she relates that she does take her medicine on a when necessary basis it does seem to help. At times he has mucousy stools but never sees blood in it. She relates intermittent abdominal pain multiple different times throughout the month. But not losing any weight. Non bloody stools   Review of Systems     Objective:   Physical Exam  Vitals reviewed. Constitutional: She appears well-developed.  HENT:  Head: Normocephalic.  Right Ear: External ear normal.  Left Ear: External ear normal.  Neck: Normal range of motion. Neck supple.  Cardiovascular: Normal rate, regular rhythm and normal heart sounds.   No murmur heard. Pulmonary/Chest: Effort normal and breath sounds normal. No respiratory distress. She has no wheezes.  Abdominal: Soft.  Lymphadenopathy:    She has no cervical adenopathy.          Assessment & Plan:  IBS (irritable bowel syndrome) - Plan: CBC, Hepatic function panel  Acute sinusitis  Migraines  ANXIETY DEPRESSION  Elevated liver enzymes - Plan: Hepatic function panel  Need for prophylactic vaccination and inoculation against influenza  Routine general medical examination at a health care facility - Plan: Glucose, random, Lipid panel  Celiac disease - Plan: Tissue transglutaminase, IgA

## 2013-07-24 ENCOUNTER — Other Ambulatory Visit: Payer: Self-pay | Admitting: *Deleted

## 2013-07-24 ENCOUNTER — Telehealth: Payer: Self-pay | Admitting: Family Medicine

## 2013-07-24 MED ORDER — BENZONATATE 100 MG PO CAPS: 100.0000 mg | ORAL_CAPSULE | Freq: Four times a day (QID) | ORAL | Status: DC | PRN

## 2013-07-24 NOTE — Telephone Encounter (Signed)
Tess 100 thirty one q6 prn

## 2013-07-24 NOTE — Telephone Encounter (Signed)
Patient would like a prescription for Tessalon pills states she is coughing.  This is in a faxed letter that is attached to her son's chart [facility])

## 2013-07-24 NOTE — Telephone Encounter (Signed)
Spoke with [place] and sent in Tess 100 thirty one q6 prn

## 2013-08-06 ENCOUNTER — Telehealth: Payer: Self-pay | Admitting: Family Medicine

## 2013-08-06 MED ORDER — CEFPROZIL 500 MG PO TABS: 500.0000 mg | ORAL_TABLET | Freq: Two times a day (BID) | ORAL | Status: DC

## 2013-08-06 MED ORDER — ALBUTEROL SULFATE HFA 108 (90 BASE) MCG/ACT IN AERS: 2.0000 | INHALATION_SPRAY | Freq: Four times a day (QID) | RESPIRATORY_TRACT | Status: DC | PRN

## 2013-08-06 NOTE — Telephone Encounter (Signed)
Patient was seen a couple weeks ago and given medication for sinusitis. She completed this medication, but says she still has a deep cough and wheezing. She would like something called in for her cough along with an inhaler.  [facility]

## 2013-08-06 NOTE — Telephone Encounter (Signed)
Cefzil 500 mg 1 twice a day 10 days, albuterol inhaler 2 puffs every 4 hours when necessary, if persistent problems or worse I would recommend following up.

## 2013-08-06 NOTE — Telephone Encounter (Signed)
meds sent to pharm. Pt notified.  

## 2013-09-05 LAB — LIPID PANEL
Cholesterol: 153 mg/dL (ref 0–200)
HDL: 38 mg/dL — ABNORMAL LOW (ref >39)
LDL Cholesterol: 90 mg/dL (ref 0–99)
Total CHOL/HDL Ratio: 4 Ratio
Triglycerides: 127 mg/dL (ref <150)
VLDL: 25 mg/dL (ref 0–40)

## 2013-09-05 LAB — HEPATIC FUNCTION PANEL
ALT: 18 U/L (ref 0–35)
AST: 17 U/L (ref 0–37)
Albumin: 4.4 g/dL (ref 3.5–5.2)
Alkaline Phosphatase: 76 U/L (ref 39–117)
Bilirubin, Direct: 0.1 mg/dL (ref 0.0–0.3)
Indirect Bilirubin: 0.3 mg/dL (ref 0.0–0.9)
Total Bilirubin: 0.4 mg/dL (ref 0.3–1.2)
Total Protein: 6.9 g/dL (ref 6.0–8.3)

## 2013-09-05 LAB — CBC
HCT: 39.8 % (ref 36.0–46.0)
Hemoglobin: 13.7 g/dL (ref 12.0–15.0)
MCH: 30.4 pg (ref 26.0–34.0)
MCHC: 34.4 g/dL (ref 30.0–36.0)
MCV: 88.4 fL (ref 78.0–100.0)
Platelets: 322 10*3/uL (ref 150–400)
RBC: 4.5 MIL/uL (ref 3.87–5.11)
RDW: 13.7 % (ref 11.5–15.5)
WBC: 7.5 10*3/uL (ref 4.0–10.5)

## 2013-09-05 LAB — GLUCOSE, RANDOM: Glucose, Bld: 87 mg/dL (ref 70–99)

## 2013-09-08 LAB — TISSUE TRANSGLUTAMINASE, IGA: Tissue Transglutaminase Ab, IgA: 2 U/mL (ref <20)

## 2013-09-09 ENCOUNTER — Encounter: Payer: Self-pay | Admitting: Family Medicine

## 2013-09-19 ENCOUNTER — Other Ambulatory Visit: Payer: Self-pay | Admitting: *Deleted

## 2013-09-19 MED ORDER — AMOXICILLIN 500 MG PO CAPS: 500.0000 mg | ORAL_CAPSULE | Freq: Three times a day (TID) | ORAL | Status: DC

## 2013-10-01 ENCOUNTER — Ambulatory Visit (INDEPENDENT_AMBULATORY_CARE_PROVIDER_SITE_OTHER): Payer: BC Managed Care – PPO | Admitting: Family Medicine

## 2013-10-01 ENCOUNTER — Encounter: Payer: Self-pay | Admitting: Family Medicine

## 2013-10-01 VITALS — BP 132/86 | Ht 61.5 in | Wt 153.2 lb

## 2013-10-01 DIAGNOSIS — F341 Dysthymic disorder: Secondary | ICD-10-CM

## 2013-10-01 DIAGNOSIS — K219 Gastro-esophageal reflux disease without esophagitis: Secondary | ICD-10-CM

## 2013-10-01 DIAGNOSIS — G43909 Migraine, unspecified, not intractable, without status migrainosus: Secondary | ICD-10-CM

## 2013-10-01 MED ORDER — ALPRAZOLAM 0.25 MG PO TABS: 0.2500 mg | ORAL_TABLET | Freq: Two times a day (BID) | ORAL | Status: DC | PRN

## 2013-10-01 MED ORDER — CYCLOBENZAPRINE HCL 10 MG PO TABS
10.0000 mg | ORAL_TABLET | Freq: Every day | ORAL | Status: DC
Start: 2013-10-01 — End: 2013-11-18

## 2013-10-01 MED ORDER — CEFDINIR 300 MG PO CAPS: 300.0000 mg | ORAL_CAPSULE | Freq: Two times a day (BID) | ORAL | Status: DC

## 2013-10-01 MED ORDER — BUSPIRONE HCL 15 MG PO TABS: 15.0000 mg | ORAL_TABLET | Freq: Three times a day (TID) | ORAL | Status: DC

## 2013-10-01 NOTE — Progress Notes (Signed)
   Subjective:    Patient ID: Alicia Owens, female    DOB: 03/29/1976, 38 y.o.   MRN: [id number]  HPI Patient arrives for a follow up on anxiety. Patient states she is having continued problems and  States her medicine is not working. Some tearful spells. Preoccupies with thoughts frequently of dread, or bad things happening, worried a lot. Not sleeping well, not able to fall asleep. Often wakes up.  Nausea- feels this way at least half the days. No bloody stools, but loose frequently. Some abd pain in the upper abd.Tries Bentyl for the lower abd pain. She has history irritable bowel syndrome she has had numerous tests in the past. She denies any rectal bleeding or vomiting of blood she is not losing any weight there is no fever or chills.   Patient also states she has continued cough despite finishing recent antibiotics. Occasionally brings up discolored phlegm most of the time is just regular phlegm no wheezing no sweats no fevers  She relates severe anxiety issues finds herself worried about multiple different things. Wellbutrin no longer helping. She also gets migraines has to take migraine medicine frequently.   Review of Systems  Constitutional: Negative for fever and activity change.  HENT: Positive for congestion. Negative for ear pain and rhinorrhea.   Eyes: Negative for discharge.  Respiratory: Positive for cough. Negative for shortness of breath and wheezing.   Cardiovascular: Negative for chest pain.       Objective:   Physical Exam  Nursing note and vitals reviewed. Constitutional: She appears well-developed.  HENT:  Head: Normocephalic.  Nose: Nose normal.  Mouth/Throat: Oropharynx is clear and moist. No oropharyngeal exudate.  Neck: Neck supple.  Cardiovascular: Normal rate and normal heart sounds.   No murmur heard. Pulmonary/Chest: Effort normal and breath sounds normal. She has no wheezes.  Lymphadenopathy:    She has no cervical adenopathy.  Skin: Skin is warm  and dry.          Assessment & Plan:  Pt does not want to use ambien-she will try Flexeril at nighttime to see if that will help. Will use low dose Xanax 0.25 or flexeril 10 qhs prn   Stop wellbutrin. -We will use BuSpar 15 mg 3 times a day over the course of the next few months she will report back to us if it is not helping over the next several weeks  Acid reflux stable if she has progressive symptoms may need to get back in the gastroenterology  Gyn will mange her hormones  30 minutes was spent with this patient.

## 2013-11-18 ENCOUNTER — Encounter: Payer: Self-pay | Admitting: Family Medicine

## 2013-11-18 ENCOUNTER — Ambulatory Visit (INDEPENDENT_AMBULATORY_CARE_PROVIDER_SITE_OTHER): Payer: BC Managed Care – PPO | Admitting: Family Medicine

## 2013-11-18 VITALS — BP 100/70 | Ht 61.5 in | Wt 153.5 lb

## 2013-11-18 DIAGNOSIS — L709 Acne, unspecified: Secondary | ICD-10-CM

## 2013-11-18 DIAGNOSIS — L708 Other acne: Secondary | ICD-10-CM

## 2013-11-18 NOTE — Progress Notes (Signed)
   Subjective:    Patient ID: Alicia Owens, female    DOB: 09/26/1975, 38 y.o.   MRN: [id number]  Anxiety Presents for follow-up visit. The quality of sleep is fair.   Compliance with medications is 76-100%.  Patient states that when she gets a bump on any area of her skin, it leaves a noticeable scar and she is concerned about this. She has a scar she would like the doctor to look at today. No other concerns at this time.  Patient denies any depression she states her anxiety levels are much better focus is good Eating a healthy  Review of Systems    no nausea vomiting chest pain shortness of breath Objective:   Physical Exam  Vitals reviewed. Constitutional: She appears well-nourished. No distress.  Cardiovascular: Normal rate, regular rhythm and normal heart sounds.   No murmur heard. Pulmonary/Chest: Effort normal and breath sounds normal. No respiratory distress.  Musculoskeletal: She exhibits no edema.  Lymphadenopathy:    She has no cervical adenopathy.  Neurological: She is alert. She exhibits normal muscle tone.  Skin:  Acne on chest and back , papular  Psychiatric: Her behavior is normal.          Assessment & Plan:  Acne-mild acne on the chest and back. The back was looked at today mainly papular not pustular. Certainly may do over-the-counter topical treatments but if not doing well with this next step would be trying doxycycline daily she will notify us if she decides to do that   anxiety-actually doing much better with medication. Moods are doing well not depressed anxiety doing well followup 6 months

## 2013-12-25 ENCOUNTER — Other Ambulatory Visit: Payer: Self-pay | Admitting: Family Medicine

## 2014-01-15 ENCOUNTER — Other Ambulatory Visit: Payer: Self-pay | Admitting: Family Medicine

## 2014-03-21 ENCOUNTER — Other Ambulatory Visit: Payer: Self-pay | Admitting: Family Medicine

## 2014-05-04 ENCOUNTER — Other Ambulatory Visit: Payer: Self-pay | Admitting: Family Medicine

## 2014-05-04 NOTE — Telephone Encounter (Signed)
May have 2 refills needs followup visit later this fall

## 2014-05-12 ENCOUNTER — Ambulatory Visit (INDEPENDENT_AMBULATORY_CARE_PROVIDER_SITE_OTHER): Payer: BC Managed Care – PPO | Admitting: Family Medicine

## 2014-05-12 ENCOUNTER — Encounter: Payer: Self-pay | Admitting: Family Medicine

## 2014-05-12 VITALS — BP 120/72 | Ht 61.0 in

## 2014-05-12 DIAGNOSIS — J301 Allergic rhinitis due to pollen: Secondary | ICD-10-CM

## 2014-05-12 DIAGNOSIS — L0231 Cutaneous abscess of buttock: Secondary | ICD-10-CM

## 2014-05-12 DIAGNOSIS — F411 Generalized anxiety disorder: Secondary | ICD-10-CM

## 2014-05-12 DIAGNOSIS — J019 Acute sinusitis, unspecified: Secondary | ICD-10-CM

## 2014-05-12 DIAGNOSIS — K625 Hemorrhage of anus and rectum: Secondary | ICD-10-CM

## 2014-05-12 DIAGNOSIS — L03317 Cellulitis of buttock: Secondary | ICD-10-CM

## 2014-05-12 MED ORDER — DOXYCYCLINE HYCLATE 100 MG PO CAPS: 100.0000 mg | ORAL_CAPSULE | Freq: Two times a day (BID) | ORAL | Status: DC

## 2014-05-12 MED ORDER — BUSPIRONE HCL 15 MG PO TABS: 15.0000 mg | ORAL_TABLET | Freq: Three times a day (TID) | ORAL | Status: DC

## 2014-05-12 MED ORDER — TOPIRAMATE 50 MG PO TABS: ORAL_TABLET | ORAL | Status: DC

## 2014-05-12 NOTE — Addendum Note (Signed)
Addended by: Lilyan Punt A on: 05/12/2014 12:43 PM   Modules accepted: Orders
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
## 2014-06-09 ENCOUNTER — Other Ambulatory Visit: Payer: Self-pay | Admitting: Family Medicine

## 2014-06-10 ENCOUNTER — Ambulatory Visit (HOSPITAL_COMMUNITY): Payer: BC Managed Care – PPO | Admitting: Psychiatry

## 2014-06-19 ENCOUNTER — Telehealth: Payer: Self-pay | Admitting: Family Medicine

## 2014-06-19 MED ORDER — AZITHROMYCIN 250 MG PO TABS: ORAL_TABLET | ORAL | Status: DC

## 2014-06-19 NOTE — Telephone Encounter (Signed)
Patient notified via VM that med was sent in to pharmacy.

## 2014-06-19 NOTE — Telephone Encounter (Signed)
zpk

## 2014-06-19 NOTE — Telephone Encounter (Signed)
Patient states that she has a cough with green colored congestion and wheezing at night. No other symptoms.

## 2014-06-19 NOTE — Telephone Encounter (Signed)
Patient was seen for allergic rhinitis on 05/12/2014.  Her congestion has now come back and she states she can feel it going into her chest. She is requesting something to be called in.   [facility]

## 2014-07-06 ENCOUNTER — Encounter: Payer: Self-pay | Admitting: Family Medicine

## 2014-08-13 ENCOUNTER — Other Ambulatory Visit: Payer: Self-pay | Admitting: Family Medicine

## 2014-08-20 ENCOUNTER — Telehealth: Payer: Self-pay | Admitting: Family Medicine

## 2014-08-20 ENCOUNTER — Ambulatory Visit: Payer: BC Managed Care – PPO

## 2014-08-20 NOTE — Telephone Encounter (Signed)
She may be given an appointment for December 23. She should bring her medications or at least a list of what she is currently taking

## 2014-08-20 NOTE — Telephone Encounter (Signed)
Patient said that she has started back with her panic attacks and she is not due to come back for her follow up on this until January.  She is requesting to see Dr. Lorin PicketScott next week.  She says that her anxiety is really bad right now.

## 2014-08-21 NOTE — Telephone Encounter (Signed)
Notified patient she may be given an appointment for December 23. She should bring her medications or at least a list of what she is currently taking. Patient verbalized understanding and transferred to front desk to schedule appointment.

## 2014-08-26 ENCOUNTER — Ambulatory Visit (INDEPENDENT_AMBULATORY_CARE_PROVIDER_SITE_OTHER): Payer: BC Managed Care – PPO | Admitting: Family Medicine

## 2014-08-26 ENCOUNTER — Encounter: Payer: Self-pay | Admitting: Family Medicine

## 2014-08-26 VITALS — BP 130/82 | Ht 61.0 in | Wt 149.2 lb

## 2014-08-26 DIAGNOSIS — Z23 Encounter for immunization: Secondary | ICD-10-CM

## 2014-08-26 DIAGNOSIS — R1084 Generalized abdominal pain: Secondary | ICD-10-CM

## 2014-08-26 LAB — BASIC METABOLIC PANEL
BUN: 5 mg/dL — ABNORMAL LOW (ref 6–23)
CO2: 20 mEq/L (ref 19–32)
CREATININE: 0.79 mg/dL (ref 0.50–1.10)
Calcium: 9.1 mg/dL (ref 8.4–10.5)
Chloride: 107 mEq/L (ref 96–112)
Glucose, Bld: 87 mg/dL (ref 70–99)
Potassium: 3.9 mEq/L (ref 3.5–5.3)
Sodium: 140 mEq/L (ref 135–145)

## 2014-08-26 LAB — HEPATIC FUNCTION PANEL
ALBUMIN: 4.4 g/dL (ref 3.5–5.2)
ALT: 13 U/L (ref 0–35)
AST: 16 U/L (ref 0–37)
Alkaline Phosphatase: 55 U/L (ref 39–117)
Bilirubin, Direct: 0.1 mg/dL (ref 0.0–0.3)
Indirect Bilirubin: 0.4 mg/dL (ref 0.2–1.2)
Total Bilirubin: 0.5 mg/dL (ref 0.2–1.2)
Total Protein: 6.9 g/dL (ref 6.0–8.3)

## 2014-08-26 LAB — CBC WITH DIFFERENTIAL/PLATELET
BASOS PCT: 0 % (ref 0–1)
Basophils Absolute: 0 10*3/uL (ref 0.0–0.1)
EOS ABS: 0.2 10*3/uL (ref 0.0–0.7)
Eosinophils Relative: 3 % (ref 0–5)
HEMATOCRIT: 39.8 % (ref 36.0–46.0)
Hemoglobin: 13.3 g/dL (ref 12.0–15.0)
Lymphocytes Relative: 26 % (ref 12–46)
Lymphs Abs: 2.1 10*3/uL (ref 0.7–4.0)
MCH: 29.8 pg (ref 26.0–34.0)
MCHC: 33.4 g/dL (ref 30.0–36.0)
MCV: 89.2 fL (ref 78.0–100.0)
MPV: 11.3 fL (ref 9.4–12.4)
Monocytes Absolute: 0.5 10*3/uL (ref 0.1–1.0)
Monocytes Relative: 6 % (ref 3–12)
NEUTROS PCT: 65 % (ref 43–77)
Neutro Abs: 5.2 10*3/uL (ref 1.7–7.7)
Platelets: 320 10*3/uL (ref 150–400)
RBC: 4.46 MIL/uL (ref 3.87–5.11)
RDW: 13.4 % (ref 11.5–15.5)
WBC: 8 10*3/uL (ref 4.0–10.5)

## 2014-08-26 LAB — POCT URINALYSIS DIPSTICK
PH UA: 6
Spec Grav, UA: 1.02

## 2014-08-26 LAB — LIPASE: LIPASE: 33 U/L (ref 0–75)

## 2014-08-26 MED ORDER — CITALOPRAM HYDROBROMIDE 10 MG PO TABS: 10.0000 mg | ORAL_TABLET | Freq: Every day | ORAL | Status: DC

## 2014-08-26 MED ORDER — AMOXICILLIN 500 MG PO TABS: 500.0000 mg | ORAL_TABLET | Freq: Three times a day (TID) | ORAL | Status: DC

## 2014-08-26 NOTE — Progress Notes (Signed)
   Subjective:    Patient ID: Alicia Owens, female    DOB: 02/08/1976, 38 y.o.   MRN: [id number]  HPI  Patient arrives to discuss anxiety. Patient with intermittent lower abdominal pain discomfort she describes as mainly in the right upper quadrant occasionally into the right mid quadrant she denies any blood or mucousy stools denies vomiting but does have some nausea she had her gallbladder taken out a few years ago and she had return of symptoms of this a few weeks ago  She also relates significant stress anxiety issues having going on there is nothing in particular that is triggering this she just at times finds self feeling sad sometimes anxious sometimes overwhelmed. Not suicidal.  She states this typically will hit her around December and last all the way to early March she is under a fair amount of stress between parenting, young children, work.   Review of Systems  Constitutional: Negative for activity change, appetite change and fatigue.  HENT: Negative for congestion.   Respiratory: Negative for shortness of breath.   Cardiovascular: Negative for chest pain.  Gastrointestinal: Positive for nausea. Negative for abdominal pain, constipation and abdominal distention.  Musculoskeletal: Negative for arthralgias.  Neurological: Negative for headaches.  Psychiatric/Behavioral: Negative for behavioral problems.       Objective:   Physical Exam  Constitutional: She appears well-nourished. No distress.  Cardiovascular: Normal rate, regular rhythm and normal heart sounds.   No murmur heard. Pulmonary/Chest: Effort normal and breath sounds normal. No respiratory distress.  Musculoskeletal: She exhibits no edema.  Lymphadenopathy:    She has no cervical adenopathy.  Neurological: She is alert. She exhibits normal muscle tone.  Psychiatric: Her behavior is normal.  Vitals reviewed.         Assessment & Plan:  abd pain-we will be checking some lab work await the results of this  I do not feel the patient needs ultrasound or scan currently she is already had her gallbladder out I don't feel there is any obstruction  Significant anxiety issues compounded by some elements of depression. Patient not suicidal. I believe low-dose Celexa 10 mg daily would be helpful. I also recommend follow-up in 4 weeks time to see how things go and continue BuSpar. 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

## 2014-08-27 LAB — TISSUE TRANSGLUTAMINASE, IGA: TISSUE TRANSGLUTAMINASE AB, IGA: 1 U/mL (ref <4)

## 2014-08-27 NOTE — Progress Notes (Signed)
Patient notified and verbalized understanding of test results. No further questions.  

## 2014-08-30 ENCOUNTER — Encounter: Payer: Self-pay | Admitting: Family Medicine

## 2014-09-23 ENCOUNTER — Ambulatory Visit (INDEPENDENT_AMBULATORY_CARE_PROVIDER_SITE_OTHER): Payer: 59 | Admitting: Family Medicine

## 2014-09-23 ENCOUNTER — Encounter: Payer: Self-pay | Admitting: Family Medicine

## 2014-09-23 VITALS — BP 130/82 | Ht 61.0 in | Wt 146.6 lb

## 2014-09-23 DIAGNOSIS — K589 Irritable bowel syndrome without diarrhea: Secondary | ICD-10-CM

## 2014-09-23 MED ORDER — ALPRAZOLAM 0.25 MG PO TABS: ORAL_TABLET | ORAL | Status: DC

## 2014-09-23 MED ORDER — CITALOPRAM HYDROBROMIDE 10 MG PO TABS: 10.0000 mg | ORAL_TABLET | Freq: Every day | ORAL | Status: DC

## 2014-09-23 NOTE — Progress Notes (Signed)
   Subjective:    Patient ID: Alicia Owens, female    DOB: 09/23/1975, 39 y.o.   MRN: [id number]  HPI  Patient arrives for a follow up on anxiety. Patient states she is feeling better but not completely leveled out. Patient states moods are doing better anxiety under better control denies excessive anxiety or other issues currently Review of Systems Has abdominal pain 2-3 days a month no vomiting or diarrhea associated with it no bloody stools.    Objective:   Physical Exam Lungs clear hearts regular pulse normal abdomen soft no guarding rebound or tenderness except for minimal tenderness in the right mid quadrant       Assessment & Plan:  Irritable bowel irritable bowel patient overall doing better we discussed importance of healthy eating regular physical activity. Medications when necessary. It bloody stools vomiting fevers or wakes her up at night she is to let us know otherwise follow-up 3-4 months  Anxiety depression doing better on the current medication probably will continue with into springtime possibly increasing it to 20 mg if patient feels she has plateaued. In addition to this may need to reinitiate medication come November of this coming year

## 2014-10-28 ENCOUNTER — Ambulatory Visit (INDEPENDENT_AMBULATORY_CARE_PROVIDER_SITE_OTHER): Payer: 59 | Admitting: Family Medicine

## 2014-10-28 ENCOUNTER — Encounter: Payer: Self-pay | Admitting: Family Medicine

## 2014-10-28 VITALS — BP 138/90 | Temp 98.0°F | Ht 61.0 in | Wt 149.0 lb

## 2014-10-28 DIAGNOSIS — J0100 Acute maxillary sinusitis, unspecified: Secondary | ICD-10-CM

## 2014-10-28 MED ORDER — CEFPROZIL 500 MG PO TABS: 500.0000 mg | ORAL_TABLET | Freq: Two times a day (BID) | ORAL | Status: DC

## 2014-10-28 MED ORDER — CITALOPRAM HYDROBROMIDE 20 MG PO TABS: 20.0000 mg | ORAL_TABLET | Freq: Every day | ORAL | Status: DC

## 2014-10-28 NOTE — Progress Notes (Signed)
   Subjective:    Patient ID: Alicia Owens, female    DOB: 09/25/1975, 39 y.o.   MRN: [id number]  Cough This is a new problem. Episode onset: 2 1/2 weeks. Associated symptoms include rhinorrhea and a sore throat. Pertinent negatives include no chest pain, ear pain, fever, shortness of breath or wheezing. Associated symptoms comments: Abdominal pain, runny  nose. Treatments tried: amoxil.   Anxiety/Stress at times not depressed   Review of Systems  Constitutional: Negative for fever and activity change.  HENT: Positive for congestion, rhinorrhea and sore throat. Negative for ear pain.   Eyes: Negative for discharge.  Respiratory: Positive for cough. Negative for shortness of breath and wheezing.   Cardiovascular: Negative for chest pain.       Objective:   Physical Exam  Constitutional: She appears well-developed.  HENT:  Head: Normocephalic.  Nose: Nose normal.  Mouth/Throat: Oropharynx is clear and moist. No oropharyngeal exudate.  Neck: Neck supple.  Cardiovascular: Normal rate and normal heart sounds.   No murmur heard. Pulmonary/Chest: Effort normal and breath sounds normal. She has no wheezes.  Lymphadenopathy:    She has no cervical adenopathy.  Skin: Skin is warm and dry.  Nursing note and vitals reviewed.         Assessment & Plan:  Pt requests to increase celexa to 20 , tolerating 10 , pt hoping sx better with 20 Not suicidal just stressed Recheck wearly summer sooner if needed  Sinusitis atx Rxd

## 2015-01-07 ENCOUNTER — Encounter: Payer: Self-pay | Admitting: Internal Medicine

## 2015-01-12 ENCOUNTER — Other Ambulatory Visit: Payer: Self-pay | Admitting: Family Medicine

## 2015-01-28 ENCOUNTER — Other Ambulatory Visit: Payer: Self-pay | Admitting: Family Medicine

## 2015-01-29 NOTE — Telephone Encounter (Signed)
Needs office visit.

## 2015-02-13 ENCOUNTER — Other Ambulatory Visit: Payer: Self-pay | Admitting: Family Medicine

## 2015-02-15 ENCOUNTER — Other Ambulatory Visit: Payer: Self-pay | Admitting: Family Medicine

## 2015-03-11 ENCOUNTER — Other Ambulatory Visit: Payer: Self-pay | Admitting: Family Medicine

## 2015-03-24 ENCOUNTER — Encounter: Payer: Self-pay | Admitting: Family Medicine

## 2015-03-24 ENCOUNTER — Ambulatory Visit (INDEPENDENT_AMBULATORY_CARE_PROVIDER_SITE_OTHER): Payer: Commercial Managed Care - HMO | Admitting: Family Medicine

## 2015-03-24 VITALS — BP 124/84 | Ht 61.5 in | Wt 162.0 lb

## 2015-03-24 DIAGNOSIS — G43009 Migraine without aura, not intractable, without status migrainosus: Secondary | ICD-10-CM

## 2015-03-24 DIAGNOSIS — K219 Gastro-esophageal reflux disease without esophagitis: Secondary | ICD-10-CM | POA: Diagnosis not present

## 2015-03-24 DIAGNOSIS — Z139 Encounter for screening, unspecified: Secondary | ICD-10-CM | POA: Diagnosis not present

## 2015-03-24 DIAGNOSIS — K589 Irritable bowel syndrome without diarrhea: Secondary | ICD-10-CM | POA: Diagnosis not present

## 2015-03-24 DIAGNOSIS — Z79899 Other long term (current) drug therapy: Secondary | ICD-10-CM | POA: Diagnosis not present

## 2015-03-24 MED ORDER — OMEPRAZOLE 20 MG PO CPDR: DELAYED_RELEASE_CAPSULE | ORAL | Status: DC

## 2015-03-24 MED ORDER — TOPIRAMATE 50 MG PO TABS: 50.0000 mg | ORAL_TABLET | Freq: Two times a day (BID) | ORAL | Status: DC

## 2015-03-24 MED ORDER — DICYCLOMINE HCL 20 MG PO TABS: ORAL_TABLET | ORAL | Status: DC

## 2015-03-24 MED ORDER — ALBUTEROL SULFATE HFA 108 (90 BASE) MCG/ACT IN AERS: 2.0000 | INHALATION_SPRAY | Freq: Four times a day (QID) | RESPIRATORY_TRACT | Status: DC | PRN

## 2015-03-24 MED ORDER — CITALOPRAM HYDROBROMIDE 20 MG PO TABS: 20.0000 mg | ORAL_TABLET | Freq: Every day | ORAL | Status: DC

## 2015-03-24 MED ORDER — CYCLOBENZAPRINE HCL 10 MG PO TABS: 10.0000 mg | ORAL_TABLET | Freq: Two times a day (BID) | ORAL | Status: DC | PRN

## 2015-03-24 MED ORDER — ALPRAZOLAM 0.25 MG PO TABS: ORAL_TABLET | ORAL | Status: DC

## 2015-03-24 MED ORDER — SUMATRIPTAN SUCCINATE 100 MG PO TABS: ORAL_TABLET | ORAL | Status: DC

## 2015-03-24 MED ORDER — ONDANSETRON HCL 8 MG PO TABS: ORAL_TABLET | ORAL | Status: DC

## 2015-03-24 NOTE — Progress Notes (Signed)
   Subjective:    Patient ID: Alicia Owens, female    DOB: 01/08/1976, 39 y.o.   MRN: [id number]  HPIpt arrives today for a med check. Pt states no concerns today. Needs refills on meds.  IVS under decent control she does try to watch things She has gained a fair amount of weight she attributes this to poor diet lack of exercise and her citalopram 20 mg she would like them on the dose of that. She relates her headaches of been doing well but she does have a flareup of headaches that occur more so around time of her cycle. She relates compliance with her medicines. She does state she does have intermittent trouble with reflux but not severe she is trying to taper off of the PPIs.   Review of Systems Patient relates intermittent abdominal cramps no vomiting no diarrhea intermittent headaches anxiety levels are good she would like to try to reduce the Celexa.    Objective:   Physical Exam Lungs are clear hearts regular pulse normal abdomen soft extremities no edema skin warm dry neurologic grossly normal       Assessment & Plan:  1. Migraine without aura and without status migrainosus, not intractable Migraines are under good control continue current measures they do tend to get worse around her cycles  2. Gastroesophageal reflux disease without esophagitis Trying to minimize try to minimize PPI as best as possible follow-up if ongoing troubles certainly use PPI if having breakthrough symptoms  3. IBS (irritable bowel syndrome) IVS stable refill of medications  4. Screening Screening lab work - Lipid panel - Glucose, random - Hepatic function panel  5. High risk medication use Monitor liver functions - Hepatic function panel  Anxiety related issues taper down on the Celexa to 10 mg of doing well with this over the course of next 30-60 days consider stopping the medicine if having a reoccurrence need to go back on the medicine this was discussed detail  Follow-up 6 months

## 2015-04-11 ENCOUNTER — Encounter: Payer: Self-pay | Admitting: Family Medicine

## 2015-04-11 LAB — HEPATIC FUNCTION PANEL
ALBUMIN: 4.4 g/dL (ref 3.5–5.5)
ALT: 10 IU/L (ref 0–32)
AST: 14 IU/L (ref 0–40)
Alkaline Phosphatase: 75 IU/L (ref 39–117)
Bilirubin Total: 0.2 mg/dL (ref 0.0–1.2)
Bilirubin, Direct: 0.07 mg/dL (ref 0.00–0.40)
TOTAL PROTEIN: 6.8 g/dL (ref 6.0–8.5)

## 2015-04-11 LAB — GLUCOSE, RANDOM: GLUCOSE: 85 mg/dL (ref 65–99)

## 2015-04-11 LAB — LIPID PANEL
Chol/HDL Ratio: 3.9 ratio units (ref 0.0–4.4)
Cholesterol, Total: 170 mg/dL (ref 100–199)
HDL: 44 mg/dL (ref >39)
LDL Calculated: 99 mg/dL (ref 0–99)
Triglycerides: 137 mg/dL (ref 0–149)
VLDL Cholesterol Cal: 27 mg/dL (ref 5–40)

## 2015-06-15 ENCOUNTER — Ambulatory Visit: Payer: Commercial Managed Care - HMO

## 2015-06-15 ENCOUNTER — Ambulatory Visit (INDEPENDENT_AMBULATORY_CARE_PROVIDER_SITE_OTHER): Payer: Commercial Managed Care - HMO

## 2015-06-15 DIAGNOSIS — Z23 Encounter for immunization: Secondary | ICD-10-CM | POA: Diagnosis not present

## 2015-06-16 ENCOUNTER — Ambulatory Visit: Payer: Commercial Managed Care - HMO

## 2015-10-27 ENCOUNTER — Ambulatory Visit (INDEPENDENT_AMBULATORY_CARE_PROVIDER_SITE_OTHER): Payer: Commercial Managed Care - HMO | Admitting: Family Medicine

## 2015-10-27 ENCOUNTER — Encounter: Payer: Self-pay | Admitting: Family Medicine

## 2015-10-27 VITALS — BP 120/78 | Ht 61.5 in | Wt 177.4 lb

## 2015-10-27 DIAGNOSIS — R1011 Right upper quadrant pain: Secondary | ICD-10-CM

## 2015-10-27 DIAGNOSIS — R5383 Other fatigue: Secondary | ICD-10-CM | POA: Diagnosis not present

## 2015-10-27 DIAGNOSIS — K219 Gastro-esophageal reflux disease without esophagitis: Secondary | ICD-10-CM

## 2015-10-27 DIAGNOSIS — K589 Irritable bowel syndrome without diarrhea: Secondary | ICD-10-CM

## 2015-10-27 DIAGNOSIS — F341 Dysthymic disorder: Secondary | ICD-10-CM | POA: Diagnosis not present

## 2015-10-27 DIAGNOSIS — G43009 Migraine without aura, not intractable, without status migrainosus: Secondary | ICD-10-CM

## 2015-10-27 MED ORDER — DICYCLOMINE HCL 20 MG PO TABS: ORAL_TABLET | ORAL | Status: DC

## 2015-10-27 MED ORDER — SUMATRIPTAN SUCCINATE 100 MG PO TABS: ORAL_TABLET | ORAL | Status: DC

## 2015-10-27 MED ORDER — CYCLOBENZAPRINE HCL 10 MG PO TABS: 10.0000 mg | ORAL_TABLET | Freq: Two times a day (BID) | ORAL | Status: DC | PRN

## 2015-10-27 MED ORDER — OMEPRAZOLE 20 MG PO CPDR: DELAYED_RELEASE_CAPSULE | ORAL | Status: DC

## 2015-10-27 MED ORDER — TOPIRAMATE 50 MG PO TABS: 50.0000 mg | ORAL_TABLET | Freq: Two times a day (BID) | ORAL | Status: DC

## 2015-10-27 MED ORDER — ALPRAZOLAM 0.25 MG PO TABS: ORAL_TABLET | ORAL | Status: DC

## 2015-10-27 MED ORDER — CITALOPRAM HYDROBROMIDE 20 MG PO TABS: 20.0000 mg | ORAL_TABLET | Freq: Every day | ORAL | Status: DC

## 2015-10-27 MED ORDER — BUSPIRONE HCL 15 MG PO TABS: ORAL_TABLET | ORAL | Status: DC

## 2015-10-27 MED ORDER — AMOXICILLIN 500 MG PO CAPS: 500.0000 mg | ORAL_CAPSULE | Freq: Three times a day (TID) | ORAL | Status: DC

## 2015-10-27 NOTE — Progress Notes (Signed)
   Subjective:    Patient ID: Alicia Owens, female    DOB: 27-Feb-1976, 40 y.o.   MRN: [id number]  HPI Patient arrives for a follow up for depression and anxiety. Patient having a lot of anxiety issues she states is related to intermittent right upper quadrant tenderness and she is worried that she will get back to the way she was several years ago.   Patient also reports upper respiratory sx for 2-3 weeks - sinus related sx, some chest sx no wheezing no fever patient relates this been going on for a few weeks sinus pressure discomfort drainage cough   and continuing ongoing lower right abdominal pain with nausea and IBS(has spoken to doctor about this at past visits.some nausea. ruq pain again, no fever, bowel movement- sometimes nl, occas loose, using dycyclomine occas mucous no bloodappetite fair   she states reflux under good control not having any heartburn She relates that her head congestion causing some sinus tenderness  states her anxiety seemingly a little worse but denies depression be worse   Review of Systems     Objective:   Physical Exam  neck no masses lungs clear heart regular sinus mild tenderness  abdomen soft subjective right upper quadrant tenderness no guarding rebound 25 minutes was spent with the patient. Greater than half the time was spent in discussion and answering questions and counseling regarding the issues that the patient came in for today.      Assessment & Plan:   anxiety issues increase BuSpar to 3 times per day refills given if this does not dramatically improve things then improve then increase Celexa as well  Irritable bowel syndrome stable. Continue medication when necessary healthy diet   Reflux under good control medication. Right upper quadrant discomfort intermittently could be irritable bowel we will check lab work. If it persists will need ultrasound as well I do not feel the patient is dealing with any type of cancer anything bad like  that follow-up sooner if any problems otherwise recheck 6 months   Migraines are stable continue current medication. Considered possibility of tapering medication on her next visit   mild sinus infection on amoxicillin today.

## 2015-10-28 LAB — CBC WITH DIFFERENTIAL/PLATELET
BASOS ABS: 0 10*3/uL (ref 0.0–0.2)
BASOS: 0 %
EOS (ABSOLUTE): 0.1 10*3/uL (ref 0.0–0.4)
Eos: 1 %
Hematocrit: 39 % (ref 34.0–46.6)
Hemoglobin: 12.7 g/dL (ref 11.1–15.9)
IMMATURE GRANULOCYTES: 0 %
Immature Grans (Abs): 0 10*3/uL (ref 0.0–0.1)
Lymphocytes Absolute: 2.3 10*3/uL (ref 0.7–3.1)
Lymphs: 26 %
MCH: 30.1 pg (ref 26.6–33.0)
MCHC: 32.6 g/dL (ref 31.5–35.7)
MCV: 92 fL (ref 79–97)
MONOS ABS: 0.4 10*3/uL (ref 0.1–0.9)
Monocytes: 5 %
NEUTROS PCT: 68 %
Neutrophils Absolute: 5.9 10*3/uL (ref 1.4–7.0)
PLATELETS: 336 10*3/uL (ref 150–379)
RBC: 4.22 x10E6/uL (ref 3.77–5.28)
RDW: 13.1 % (ref 12.3–15.4)
WBC: 8.7 10*3/uL (ref 3.4–10.8)

## 2015-10-28 LAB — HEPATIC FUNCTION PANEL
ALT: 18 IU/L (ref 0–32)
AST: 21 IU/L (ref 0–40)
Albumin: 4.6 g/dL (ref 3.5–5.5)
Alkaline Phosphatase: 84 IU/L (ref 39–117)
BILIRUBIN TOTAL: 0.3 mg/dL (ref 0.0–1.2)
Bilirubin, Direct: 0.08 mg/dL (ref 0.00–0.40)
Total Protein: 7.7 g/dL (ref 6.0–8.5)

## 2015-10-28 LAB — C-REACTIVE PROTEIN: CRP: 2.8 mg/L (ref 0.0–4.9)

## 2015-10-28 LAB — T4, FREE: Free T4: 1.09 ng/dL (ref 0.82–1.77)

## 2015-10-28 LAB — TSH: TSH: 0.851 u[IU]/mL (ref 0.450–4.500)

## 2015-10-28 LAB — LIPASE: Lipase: 40 U/L (ref 0–59)

## 2016-02-01 ENCOUNTER — Ambulatory Visit (INDEPENDENT_AMBULATORY_CARE_PROVIDER_SITE_OTHER): Payer: Commercial Managed Care - HMO | Admitting: Family Medicine

## 2016-02-01 ENCOUNTER — Encounter: Payer: Self-pay | Admitting: Family Medicine

## 2016-02-01 VITALS — BP 118/78 | Temp 98.8°F | Ht 61.5 in | Wt 179.2 lb

## 2016-02-01 DIAGNOSIS — J019 Acute sinusitis, unspecified: Secondary | ICD-10-CM | POA: Diagnosis not present

## 2016-02-01 DIAGNOSIS — B9689 Other specified bacterial agents as the cause of diseases classified elsewhere: Secondary | ICD-10-CM

## 2016-02-01 MED ORDER — AMOXICILLIN-POT CLAVULANATE 875-125 MG PO TABS: 1.0000 | ORAL_TABLET | Freq: Two times a day (BID) | ORAL | Status: DC

## 2016-02-01 NOTE — Progress Notes (Signed)
   Subjective:    Patient ID: Alicia Owens, female    DOB: 12/14/1975, 40 y.o.   MRN: [id number]  URI  This is a new problem. The current episode started 1 to 4 weeks ago. The problem has been unchanged. There has been no fever. Associated symptoms include congestion, coughing, headaches, rhinorrhea and a sore throat. Pertinent negatives include no chest pain, ear pain or wheezing. She has tried decongestant and antihistamine for the symptoms. The treatment provided no relief.    Patient with intermittent coughing congestion or past 3 weeks moderate sinus pressure pain discomfort  Review of Systems  Constitutional: Negative for fever and activity change.  HENT: Positive for congestion, rhinorrhea and sore throat. Negative for ear pain.   Eyes: Negative for discharge.  Respiratory: Positive for cough. Negative for shortness of breath and wheezing.   Cardiovascular: Negative for chest pain.  Neurological: Positive for headaches.       Objective:   Physical Exam  Constitutional: She appears well-developed.  HENT:  Head: Normocephalic.  Nose: Nose normal.  Mouth/Throat: Oropharynx is clear and moist. No oropharyngeal exudate.  Neck: Neck supple.  Cardiovascular: Normal rate and normal heart sounds.   No murmur heard. Pulmonary/Chest: Effort normal and breath sounds normal. She has no wheezes.  Lymphadenopathy:    She has no cervical adenopathy.  Skin: Skin is warm and dry.  Nursing note and vitals reviewed.   No asthma flareup detected. May use albuterol when necessary.      Assessment & Plan:  Patient was seen today for upper respiratory illness. It is felt that the patient is dealing with sinusitis. Antibiotics were prescribed today. Importance of compliance with medication was discussed. Symptoms should gradually resolve over the course of the next several days. If high fevers, progressive illness, difficulty breathing, worsening condition or failure for symptoms to improve  over the next several days then the patient is to follow-up. If any emergent conditions the patient is to follow-up in the emergency department otherwise to follow-up in the office.

## 2016-03-09 ENCOUNTER — Encounter: Payer: Self-pay | Admitting: Family Medicine

## 2016-03-09 ENCOUNTER — Ambulatory Visit (INDEPENDENT_AMBULATORY_CARE_PROVIDER_SITE_OTHER): Payer: Commercial Managed Care - HMO | Admitting: Family Medicine

## 2016-03-09 VITALS — BP 132/82 | Temp 97.8°F | Ht 61.5 in | Wt 185.0 lb

## 2016-03-09 DIAGNOSIS — J209 Acute bronchitis, unspecified: Secondary | ICD-10-CM | POA: Diagnosis not present

## 2016-03-09 DIAGNOSIS — N3000 Acute cystitis without hematuria: Secondary | ICD-10-CM | POA: Diagnosis not present

## 2016-03-09 DIAGNOSIS — R300 Dysuria: Secondary | ICD-10-CM | POA: Diagnosis not present

## 2016-03-09 DIAGNOSIS — J4520 Mild intermittent asthma, uncomplicated: Secondary | ICD-10-CM

## 2016-03-09 DIAGNOSIS — I878 Other specified disorders of veins: Secondary | ICD-10-CM

## 2016-03-09 LAB — POCT URINALYSIS DIPSTICK
SPEC GRAV UA: 1.02
pH, UA: 6

## 2016-03-09 MED ORDER — CIPROFLOXACIN HCL 500 MG PO TABS: 500.0000 mg | ORAL_TABLET | Freq: Two times a day (BID) | ORAL | Status: DC

## 2016-03-09 MED ORDER — TRIAMTERENE-HCTZ 37.5-25 MG PO CAPS: ORAL_CAPSULE | ORAL | Status: DC

## 2016-03-09 NOTE — Progress Notes (Signed)
   Subjective:    Patient ID: Alicia Owens, female    DOB: 01/15/1976, 40 y.o.   MRN: [id number] Patient arrives office with 3 distinct concerns HPI  Patient arrives with c/o dysuria   and swelling in hands and feet for 2-3 weeks.   Patient currently on Macrobid.. Received versus from online doc  Had a uti severlal weeks ago, and called the telephone do  Who called in a five d dose of macrobid  Still having some symptoms of burning  Has not used an Engineer, agriculturalihaler for a while. Has developed cough. Generally nonproductive but on occasion getting some phlegm up. Some sense of tightness and wheeziness at times.  Non prod cough   Hands and feet swelling at times, has occurred in the sumer in the past. No substantial shortness of breath no orthopnea. Tends to swell up more during menstrual cycle. Tends a swelling on the summer time  Last menses 1 week ago    No chest pain no hemoptysis    Review of Systems No headache, no major weight loss or weight gain, no chest pain no back pain abdominal pain no change in bowel habits complete ROS otherwise negative     Objective:   Physical Exam  Alert somewhat anxious H&T slight nasal congestion pharynx normal lungs no wheezes auscultated heart regular rate rhythm 1 cough during exam abdomen benign ankles feet mixture of subcutaneous adipose tissue and slight edema      Assessment & Plan:  Impression 1 peripheral edema likely element of venous stasis discussed patient would like to have diuretic to use on when necessary basis only doubt more serious etiology #2 subacute bronchitis abdominal reactive airways proper inhaler use discussed #3 urinary check infection will choose antibiotic to cover plan as noted above [facility]

## 2016-04-10 ENCOUNTER — Telehealth: Payer: Self-pay | Admitting: Family Medicine

## 2016-04-10 ENCOUNTER — Other Ambulatory Visit: Payer: Self-pay | Admitting: *Deleted

## 2016-04-10 MED ORDER — AMOXICILLIN-POT CLAVULANATE 875-125 MG PO TABS: 1.0000 | ORAL_TABLET | Freq: Two times a day (BID) | ORAL | 0 refills | Status: DC

## 2016-04-10 NOTE — Telephone Encounter (Signed)
At this point start Augmentin 875 mg, 1 twice a day, 10 days, continue allergy medication in spray

## 2016-04-10 NOTE — Telephone Encounter (Signed)
With drainage been clear do not recommend additional antibiotics at this point. I recommend Flonase 2 sprays each nares every single day along with Allegra generic every single day if not seeing significant improvement with this over the next 5-7 days especially if discolored drainage we would consider adding an antibiotic call us back if ongoing troubles allergy medications will take 5-7 days to kick in

## 2016-04-10 NOTE — Telephone Encounter (Signed)
Left message to return call 

## 2016-04-10 NOTE — Telephone Encounter (Signed)
Left message to return call to get more info.  

## 2016-04-10 NOTE — Telephone Encounter (Signed)
Seen 7/6 finished cipro. Feels about the same. Sinus pressure, ear pressure, drainage clear, cough, no fever, no trouble breathing.

## 2016-04-10 NOTE — Telephone Encounter (Signed)
Discussed with pt. Med sent to pharm.  

## 2016-04-10 NOTE — Telephone Encounter (Signed)
Pt called stating that she was seen aprox. 4 weeks ago and is still not better. Pt wants to know if something can be called in.      [facility]

## 2016-04-10 NOTE — Telephone Encounter (Signed)
Drainage in head is clear. Now pt states coughing up yellow mucus. No fever, no wheezing. Taking nasacort and allegra every day.

## 2016-04-25 ENCOUNTER — Ambulatory Visit (INDEPENDENT_AMBULATORY_CARE_PROVIDER_SITE_OTHER): Payer: Commercial Managed Care - HMO | Admitting: Family Medicine

## 2016-04-25 ENCOUNTER — Encounter: Payer: Self-pay | Admitting: Family Medicine

## 2016-04-25 VITALS — BP 116/80 | Ht 61.5 in | Wt 183.2 lb

## 2016-04-25 DIAGNOSIS — Z79899 Other long term (current) drug therapy: Secondary | ICD-10-CM

## 2016-04-25 DIAGNOSIS — K219 Gastro-esophageal reflux disease without esophagitis: Secondary | ICD-10-CM | POA: Diagnosis not present

## 2016-04-25 DIAGNOSIS — G43009 Migraine without aura, not intractable, without status migrainosus: Secondary | ICD-10-CM

## 2016-04-25 DIAGNOSIS — Z13220 Encounter for screening for lipoid disorders: Secondary | ICD-10-CM

## 2016-04-25 DIAGNOSIS — R109 Unspecified abdominal pain: Secondary | ICD-10-CM | POA: Diagnosis not present

## 2016-04-25 DIAGNOSIS — F341 Dysthymic disorder: Secondary | ICD-10-CM | POA: Diagnosis not present

## 2016-04-25 LAB — POCT URINALYSIS DIPSTICK: PH UA: 7

## 2016-04-25 MED ORDER — OMEPRAZOLE 20 MG PO CPDR: DELAYED_RELEASE_CAPSULE | ORAL | 6 refills | Status: DC

## 2016-04-25 MED ORDER — ALPRAZOLAM 0.25 MG PO TABS: ORAL_TABLET | ORAL | 5 refills | Status: DC

## 2016-04-25 MED ORDER — ONDANSETRON HCL 8 MG PO TABS: ORAL_TABLET | ORAL | 5 refills | Status: DC

## 2016-04-25 MED ORDER — ALBUTEROL SULFATE HFA 108 (90 BASE) MCG/ACT IN AERS: 2.0000 | INHALATION_SPRAY | Freq: Four times a day (QID) | RESPIRATORY_TRACT | 4 refills | Status: DC | PRN

## 2016-04-25 MED ORDER — CEFPROZIL 500 MG PO TABS: 500.0000 mg | ORAL_TABLET | Freq: Two times a day (BID) | ORAL | 0 refills | Status: DC

## 2016-04-25 MED ORDER — CYCLOBENZAPRINE HCL 10 MG PO TABS: 10.0000 mg | ORAL_TABLET | Freq: Two times a day (BID) | ORAL | 6 refills | Status: DC | PRN

## 2016-04-25 MED ORDER — CITALOPRAM HYDROBROMIDE 20 MG PO TABS: 20.0000 mg | ORAL_TABLET | Freq: Every day | ORAL | 5 refills | Status: DC

## 2016-04-25 MED ORDER — BUSPIRONE HCL 15 MG PO TABS: ORAL_TABLET | ORAL | 6 refills | Status: DC

## 2016-04-25 MED ORDER — TOPIRAMATE 50 MG PO TABS: 50.0000 mg | ORAL_TABLET | Freq: Two times a day (BID) | ORAL | 5 refills | Status: DC

## 2016-04-25 MED ORDER — SUMATRIPTAN SUCCINATE 100 MG PO TABS: ORAL_TABLET | ORAL | 5 refills | Status: DC

## 2016-04-25 NOTE — Progress Notes (Signed)
   Subjective:    Patient ID: Alicia Owens, female    DOB: 03/08/1976, 40 y.o.   MRN: [id number]  Anxiety  Presents for follow-up visit. Patient reports no chest pain, nausea or shortness of breath. Nighttime awakenings: occasional.   Compliance with medications is 76-100%.   Patient states that she is having right sided low back pain and neck pain. Onset 3-4 weeks ago. Treatments tried: ibuprofen, aleve with mild relief.  Patient has no other concerns at this time.  Patient relates occasionally having a slight tightness in the chest when she takes a deep breath denies any severe pain does do some walking but no vigorous exercise Patient does have reflux she states she tried tapering off of omeprazole but has significant troubles therefore she needs refills She does occasionally get swelling in the legs use Dyazide on a when necessary basis She does get headaches intermittently but Topamax does good job keeping him under control needs refills Anxiety depression overall doing well with citalopram 10 mg daily Does need refills on her Xanax and BuSpar this helps she denies overusing Xanax In addition to this patient with mild trapezius pain and discomfort with certain movements and mild mid back pain and discomfort with certain movements no known injury.  Review of Systems  Constitutional: Negative for fatigue and fever.  Respiratory: Positive for chest tightness. Negative for cough and shortness of breath.   Cardiovascular: Negative for chest pain.  Gastrointestinal: Positive for diarrhea. Negative for blood in stool, nausea and vomiting.  Genitourinary: Negative for dysuria.       Objective:   Physical Exam  Lungs are clear hearts regular pulse normal abdomen soft no guarding or rebound right side mid back slight tenderness trapezius moderate tenderness  Urinalysis with scattered WBCs    Assessment & Plan:  Urine culture pending Cefzil 500 twice a day 3 days Renewal on migraine  medicine as well as reflux medicine as well as antidepressant nerve medicine Refills on medicines updated I agree with patient taking 10 mg citalopram daily She will follow-up in 6 months Screening lab work pending Trapezius strain should gradually get better with stretching Mid back pain I recommend stretching anti-inflammatories if not doing well over the course of the next few weeks recommend consideration for further testing

## 2016-04-26 ENCOUNTER — Encounter: Payer: Self-pay | Admitting: Family Medicine

## 2016-04-26 LAB — LIPID PANEL
Chol/HDL Ratio: 4.3 ratio (ref 0.0–4.4)
Cholesterol, Total: 191 mg/dL (ref 100–199)
HDL: 44 mg/dL (ref >39)
LDL Calculated: 115 mg/dL — ABNORMAL HIGH (ref 0–99)
Triglycerides: 159 mg/dL — ABNORMAL HIGH (ref 0–149)
VLDL Cholesterol Cal: 32 mg/dL (ref 5–40)

## 2016-04-26 LAB — BASIC METABOLIC PANEL
BUN / CREAT RATIO: 6 — AB (ref 9–23)
BUN: 5 mg/dL — ABNORMAL LOW (ref 6–24)
CALCIUM: 8.9 mg/dL (ref 8.7–10.2)
CHLORIDE: 100 mmol/L (ref 96–106)
CO2: 22 mmol/L (ref 18–29)
Creatinine, Ser: 0.8 mg/dL (ref 0.57–1.00)
GFR calc non Af Amer: 93 mL/min/{1.73_m2} (ref >59)
GFR, EST AFRICAN AMERICAN: 107 mL/min/{1.73_m2} (ref >59)
GLUCOSE: 83 mg/dL (ref 65–99)
POTASSIUM: 4.5 mmol/L (ref 3.5–5.2)
Sodium: 138 mmol/L (ref 134–144)

## 2016-04-27 LAB — URINE CULTURE

## 2016-05-11 ENCOUNTER — Ambulatory Visit (INDEPENDENT_AMBULATORY_CARE_PROVIDER_SITE_OTHER): Payer: Commercial Managed Care - HMO | Admitting: Family Medicine

## 2016-05-11 ENCOUNTER — Encounter: Payer: Self-pay | Admitting: Family Medicine

## 2016-05-11 VITALS — BP 118/80 | Temp 98.0°F | Ht 61.5 in | Wt 183.5 lb

## 2016-05-11 DIAGNOSIS — J4520 Mild intermittent asthma, uncomplicated: Secondary | ICD-10-CM | POA: Diagnosis not present

## 2016-05-11 DIAGNOSIS — J329 Chronic sinusitis, unspecified: Secondary | ICD-10-CM | POA: Diagnosis not present

## 2016-05-11 MED ORDER — BENZONATATE 100 MG PO CAPS: 100.0000 mg | ORAL_CAPSULE | Freq: Four times a day (QID) | ORAL | 0 refills | Status: DC | PRN

## 2016-05-11 MED ORDER — AMOXICILLIN-POT CLAVULANATE 875-125 MG PO TABS: 1.0000 | ORAL_TABLET | Freq: Two times a day (BID) | ORAL | 0 refills | Status: DC

## 2016-05-11 NOTE — Progress Notes (Signed)
   Subjective:    Patient ID: Alicia Owens, female    DOB: 02/01/1976, 40 y.o.   MRN: [id number]  URI   This is a new problem. The current episode started in the past 7 days. The problem has been unchanged. Associated symptoms include congestion, coughing, sinus pain and wheezing. Treatments tried: allegra, nasacort, tylenol, aleve. The treatment provided no relief.   Started ten fd ago, now into sinus pressure  Now into the chewt  Cough prductive  Now , prod gunky stuff morn sor e throt    Review of Systems  HENT: Positive for congestion.   Respiratory: Positive for cough and wheezing.        Objective:   Physical Exam  Alert, mild malaise. Hydration good Vitals stable. frontal/ maxillary tenderness evident positive nasal congestion. pharynx normal neck supple  lungs clear/no crackles or wheezes. heart regular in rhythm       Assessment & Plan:  Impression rhinosinusitis likely post viral, discussed with patient. plan antibiotics prescribed. Questions answered. Symptomatic care discussed. warning signs discussed. Patient undoubtedly has element of reactive airways also to use albuterol 2 sprays 4 times a day when necessary for wheezes [facility]SL

## 2016-06-08 ENCOUNTER — Ambulatory Visit (INDEPENDENT_AMBULATORY_CARE_PROVIDER_SITE_OTHER): Payer: Commercial Managed Care - HMO | Admitting: *Deleted

## 2016-06-08 DIAGNOSIS — Z23 Encounter for immunization: Secondary | ICD-10-CM | POA: Diagnosis not present

## 2016-06-21 ENCOUNTER — Telehealth: Payer: Self-pay | Admitting: Family Medicine

## 2016-06-21 NOTE — Telephone Encounter (Signed)
Pt is out of town and took a urine strep test last night that tested positive. Pt is wanting to know if an antibiotic can be called in for her. Pt will be home tonight around 6:30. Please advise.     [facility]

## 2016-06-21 NOTE — Telephone Encounter (Signed)
Patient transferred to front desk to schedule appointment.  

## 2016-06-21 NOTE — Telephone Encounter (Signed)
o v tomorrow 

## 2016-06-21 NOTE — Telephone Encounter (Signed)
Left message return 06/21/16

## 2016-06-22 ENCOUNTER — Encounter: Payer: Self-pay | Admitting: Family Medicine

## 2016-06-22 ENCOUNTER — Ambulatory Visit (INDEPENDENT_AMBULATORY_CARE_PROVIDER_SITE_OTHER): Payer: Commercial Managed Care - HMO | Admitting: Family Medicine

## 2016-06-22 VITALS — BP 108/80 | Temp 97.6°F | Ht 61.5 in | Wt 184.1 lb

## 2016-06-22 DIAGNOSIS — J209 Acute bronchitis, unspecified: Secondary | ICD-10-CM

## 2016-06-22 DIAGNOSIS — R300 Dysuria: Secondary | ICD-10-CM | POA: Diagnosis not present

## 2016-06-22 DIAGNOSIS — N3000 Acute cystitis without hematuria: Secondary | ICD-10-CM | POA: Diagnosis not present

## 2016-06-22 LAB — POCT URINALYSIS DIPSTICK: PH UA: 8

## 2016-06-22 MED ORDER — CEFPROZIL 500 MG PO TABS: 500.0000 mg | ORAL_TABLET | Freq: Two times a day (BID) | ORAL | 0 refills | Status: DC

## 2016-06-22 NOTE — Progress Notes (Signed)
   Subjective:    Patient ID: Alicia Owens, female    DOB: 11/17/1975, 40 y.o.   MRN: [id number]  Urinary Tract Infection   This is a new problem. The current episode started in the past 7 days. The problem occurs intermittently. The problem has been unchanged. The quality of the pain is described as burning. The pain is moderate. There has been no fever. Associated symptoms include chills. Pertinent negatives include no flank pain. Associated symptoms comments: Abdominal pain . She has tried NSAIDs (AZO) for the symptoms. The treatment provided no relief.   Patient states that she has some chest tightness and congestion also.     Review of Systems  Constitutional: Positive for chills. Negative for fatigue and fever.  HENT: Positive for congestion.   Respiratory: Positive for cough.   Gastrointestinal: Positive for abdominal pain.  Genitourinary: Positive for difficulty urinating and dysuria. Negative for flank pain.       Objective:   Physical Exam  Constitutional: She appears well-developed.  HENT:  Head: Normocephalic.  Nose: Nose normal.  Mouth/Throat: Oropharynx is clear and moist. No oropharyngeal exudate.  Neck: Neck supple.  Cardiovascular: Normal rate and normal heart sounds.   No murmur heard. Pulmonary/Chest: Effort normal and breath sounds normal. She has no wheezes.  Lymphadenopathy:    She has no cervical adenopathy.  Skin: Skin is warm and dry.  Nursing note and vitals reviewed.         Assessment & Plan:  Viral bronchitis should gradually get better Urinary tract infection-second well with him 4 months. Encourage fluid intake regular urination antibiotics prescribed culture sent await the results may need a follow-up urinalysis culture again warning signs discussed regarding complications

## 2016-06-24 LAB — URINE CULTURE

## 2016-07-02 ENCOUNTER — Encounter: Payer: Self-pay | Admitting: Family Medicine

## 2016-07-03 NOTE — Telephone Encounter (Signed)
Please set up the patient to come by the office and give a urine specimen for a urinalysis and possible culture 

## 2016-07-04 ENCOUNTER — Telehealth (INDEPENDENT_AMBULATORY_CARE_PROVIDER_SITE_OTHER): Payer: Commercial Managed Care - HMO | Admitting: *Deleted

## 2016-07-04 DIAGNOSIS — R300 Dysuria: Secondary | ICD-10-CM | POA: Diagnosis not present

## 2016-07-04 NOTE — Telephone Encounter (Signed)
Per dr Lorin Picketscott -see mychart messagae

## 2016-07-04 NOTE — Telephone Encounter (Signed)
Pt notified. Await urine sample

## 2016-07-04 NOTE — Telephone Encounter (Signed)
Please set up the patient to come by the office and give a urine specimen for a urinalysis and possible culture

## 2016-07-05 ENCOUNTER — Other Ambulatory Visit: Payer: Self-pay | Admitting: Family Medicine

## 2016-07-05 LAB — POCT URINALYSIS DIPSTICK: pH, UA: 7

## 2016-07-05 NOTE — Telephone Encounter (Signed)
Pt dropped off urine this morning. Sample dipped and spun. Ready for you to look at.  Urine culture sent.

## 2016-07-06 ENCOUNTER — Telehealth: Payer: Self-pay | Admitting: Family Medicine

## 2016-07-06 NOTE — Telephone Encounter (Signed)
Requesting results to urine culture.

## 2016-07-06 NOTE — Telephone Encounter (Signed)
Urine culture pending hopefully will be back Friday or possibly over the weekend. We will definitely be checking tomorrow

## 2016-07-07 LAB — URINE CULTURE

## 2016-07-07 NOTE — Telephone Encounter (Signed)
Notified patient urine culture pending hopefully will be back Friday or possibly over the weekend. We will definitely be checking tomorrow. Patient verbalized understanding.

## 2016-07-07 NOTE — Telephone Encounter (Signed)
Result sent via my chart nurse did try to call see lab notes

## 2016-07-20 ENCOUNTER — Ambulatory Visit (INDEPENDENT_AMBULATORY_CARE_PROVIDER_SITE_OTHER): Payer: Commercial Managed Care - HMO | Admitting: Nurse Practitioner

## 2016-07-20 ENCOUNTER — Encounter: Payer: Self-pay | Admitting: Nurse Practitioner

## 2016-07-20 VITALS — Temp 97.8°F | Ht 61.5 in | Wt 184.0 lb

## 2016-07-20 DIAGNOSIS — J069 Acute upper respiratory infection, unspecified: Secondary | ICD-10-CM | POA: Diagnosis not present

## 2016-07-20 DIAGNOSIS — K219 Gastro-esophageal reflux disease without esophagitis: Secondary | ICD-10-CM

## 2016-07-20 DIAGNOSIS — B9689 Other specified bacterial agents as the cause of diseases classified elsewhere: Secondary | ICD-10-CM | POA: Diagnosis not present

## 2016-07-20 MED ORDER — AMOXICILLIN-POT CLAVULANATE 875-125 MG PO TABS: 1.0000 | ORAL_TABLET | Freq: Two times a day (BID) | ORAL | 0 refills | Status: DC

## 2016-07-20 MED ORDER — BENZONATATE 100 MG PO CAPS: 100.0000 mg | ORAL_CAPSULE | Freq: Four times a day (QID) | ORAL | 0 refills | Status: DC | PRN

## 2016-07-20 NOTE — Progress Notes (Signed)
Subjective:  Presents for c/o congestion x 2 weeks. No fever. Sore throat. Spells of cough. Runny nose. Now producing green yellow mucus. Sinus headache. Wheezing with prolonged cough. Has not used inhaler. No N/V. Mild upper abd pain. Has had increased reflux for a few weeks. On Omeprazole. Minimal caffeine intake.   Objective:   Temp 97.8 F (36.6 C) (Oral)   Ht 5' 1.5" (1.562 m)   Wt 184 lb (83.5 kg)   BMI 34.20 kg/m  NAD. Alert, oriented. TMs retracted. No erythema. Pharynx erythema with green PND noted. Neck supple with mild adenopathy. Lungs clear. Heart RRR. Abdomen soft, non distended with mild epigastric tenderness.   Assessment:  Problem List Items Addressed This Visit      Digestive   GERD    Other Visit Diagnoses    Bacterial upper respiratory infection    -  Primary     Plan:  Meds ordered this encounter  Medications  . benzonatate (TESSALON) 100 MG capsule    Sig: Take 1 capsule (100 mg total) by mouth every 6 (six) hours as needed for cough.    Dispense:  30 capsule    Refill:  0    Order Specific Question:   Supervising Provider    Answer:   Merlyn AlbertLUKING, WILLIAM S [[id number]]  . amoxicillin-clavulanate (AUGMENTIN) 875-125 MG tablet    Sig: Take 1 tablet by mouth 2 (two) times daily.    Dispense:  20 tablet    Refill:  0    Order Specific Question:   Supervising Provider    Answer:   Merlyn AlbertLUKING, WILLIAM S [[id number]]   Add OTC Maalox or TUMS as directed. Call back in 7-10 days if any symptoms persist.

## 2016-08-11 ENCOUNTER — Other Ambulatory Visit: Payer: Self-pay | Admitting: Nurse Practitioner

## 2016-08-11 ENCOUNTER — Telehealth: Payer: Self-pay | Admitting: Family Medicine

## 2016-08-11 MED ORDER — AMOXICILLIN-POT CLAVULANATE 875-125 MG PO TABS: 1.0000 | ORAL_TABLET | Freq: Two times a day (BID) | ORAL | 0 refills | Status: DC

## 2016-08-11 NOTE — Telephone Encounter (Signed)
Refilled antibiotic. Office visit if no better.

## 2016-08-11 NOTE — Telephone Encounter (Signed)
Patient was seen 0n 11/16 and wanting refill antibiotic that was prescribe.Still having same symptons. [facility]

## 2016-08-11 NOTE — Telephone Encounter (Signed)
Left message to return call 

## 2016-08-11 NOTE — Telephone Encounter (Signed)
Pt notified on voicemail  °

## 2016-09-01 ENCOUNTER — Encounter: Payer: Self-pay | Admitting: Family Medicine

## 2016-09-01 ENCOUNTER — Ambulatory Visit (INDEPENDENT_AMBULATORY_CARE_PROVIDER_SITE_OTHER): Payer: Commercial Managed Care - HMO | Admitting: Family Medicine

## 2016-09-01 VITALS — BP 134/76 | Temp 98.3°F | Ht 61.5 in | Wt 183.0 lb

## 2016-09-01 DIAGNOSIS — J019 Acute sinusitis, unspecified: Secondary | ICD-10-CM | POA: Diagnosis not present

## 2016-09-01 DIAGNOSIS — B9689 Other specified bacterial agents as the cause of diseases classified elsewhere: Secondary | ICD-10-CM | POA: Diagnosis not present

## 2016-09-01 DIAGNOSIS — J209 Acute bronchitis, unspecified: Secondary | ICD-10-CM

## 2016-09-01 MED ORDER — CEFDINIR 300 MG PO CAPS: 300.0000 mg | ORAL_CAPSULE | Freq: Two times a day (BID) | ORAL | 0 refills | Status: DC

## 2016-09-01 MED ORDER — BENZONATATE 100 MG PO CAPS: 100.0000 mg | ORAL_CAPSULE | Freq: Three times a day (TID) | ORAL | 1 refills | Status: DC | PRN

## 2016-09-01 NOTE — Progress Notes (Signed)
   Subjective:    Patient ID: Alicia Owens, female    DOB: 07/10/1976, 40 y.o.   MRN: [id number]  Sinusitis  This is a new problem. Episode onset: 2 weeks  Associated symptoms include congestion, coughing and a sore throat. (Wheezing) Treatments tried: ibuprofen, nasal spray, allergy med.   Sinus congestion drainage repetitive infections started back in September then again in November and then again in early December with chest congestion coughing sinus pressure denies high fever chills Intermittent chest tightness for which she has to use albuterol  Review of Systems  HENT: Positive for congestion and sore throat.   Respiratory: Positive for cough.       no high fever no vomiting or diarrhea Objective:   Physical Exam  Moderate sinus tenderness throat is normal neck supple lungs clear heart regular      Assessment & Plan:  Use albuterol when necessary Repetitive viral illnesses Secondary rhinosinusitis Antibiotic prescribed warning signs discussed follow-up if problems

## 2016-09-23 ENCOUNTER — Encounter: Payer: Self-pay | Admitting: Family Medicine

## 2016-09-27 ENCOUNTER — Encounter: Payer: Self-pay | Admitting: Family Medicine

## 2016-09-27 ENCOUNTER — Ambulatory Visit (INDEPENDENT_AMBULATORY_CARE_PROVIDER_SITE_OTHER): Payer: Commercial Managed Care - HMO | Admitting: Family Medicine

## 2016-09-27 VITALS — BP 110/74 | Temp 97.6°F | Ht 61.5 in | Wt 187.1 lb

## 2016-09-27 DIAGNOSIS — L03031 Cellulitis of right toe: Secondary | ICD-10-CM | POA: Diagnosis not present

## 2016-09-27 DIAGNOSIS — J4531 Mild persistent asthma with (acute) exacerbation: Secondary | ICD-10-CM | POA: Diagnosis not present

## 2016-09-27 DIAGNOSIS — J45909 Unspecified asthma, uncomplicated: Secondary | ICD-10-CM | POA: Insufficient documentation

## 2016-09-27 MED ORDER — FLUTICASONE PROPIONATE HFA 110 MCG/ACT IN AERO: 2.0000 | INHALATION_SPRAY | Freq: Two times a day (BID) | RESPIRATORY_TRACT | 12 refills | Status: DC

## 2016-09-27 MED ORDER — DOXYCYCLINE HYCLATE 100 MG PO CAPS: 100.0000 mg | ORAL_CAPSULE | Freq: Two times a day (BID) | ORAL | 0 refills | Status: DC

## 2016-09-27 NOTE — Progress Notes (Signed)
   Subjective:    Patient ID: Alicia Owens, female    DOB: 07/20/1976, 41 y.o.   MRN: [id number]  Toe Pain   The incident occurred more than 1 week ago. The incident occurred at home. There was no injury mechanism. The pain is present in the right toes. The quality of the pain is described as aching. The pain is moderate. The pain has been intermittent since onset. She reports no foreign bodies present. Nothing aggravates the symptoms. Treatments tried: epsom salt. The treatment provided no relief.  Cough  This is a new problem. The current episode started in the past 7 days. The problem has been unchanged. The cough is non-productive. Nothing aggravates the symptoms. The treatment provided no relief.   Persistent cough with intermittent tightness in the lungs feels like a wheeze this been going on over the past several weeks she keeps getting reoccurring spells like this patient is not a smoker did not have asthma as a kid does have mild allergic rhinitis issues  What appears to be a early ingrown toenail with some infection over the past week pain and discomfort   Review of Systems  Respiratory: Positive for cough.   Denies high fever chills sweats vomiting diarrhea     Objective:   Physical Exam Lungs are clear no crackles heart regular pulse normal tight cough noted lateral possible ingrown toenail on the right greater toe       Assessment & Plan:  Possible ingrown toenail with cellulitis antibiotics warm compresses pushed the lateral edge back if ongoing trouble referral to podiatry  Frequent reoccurring upper respiratory illness versus possibility of reactive airway/mild asthma recommend steroid inhaler and albuterol to follow-up if ongoing troubles patient is to give us update in approximately 4 weeks how she is doing with this approach may need to see allergist/asthma doctor if ongoing troubles

## 2016-10-04 ENCOUNTER — Encounter: Payer: Self-pay | Admitting: Internal Medicine

## 2016-10-31 ENCOUNTER — Encounter: Payer: Self-pay | Admitting: Family Medicine

## 2016-10-31 ENCOUNTER — Ambulatory Visit (INDEPENDENT_AMBULATORY_CARE_PROVIDER_SITE_OTHER): Payer: 59 | Admitting: Family Medicine

## 2016-10-31 VITALS — BP 100/78 | Ht 61.5 in | Wt 186.2 lb

## 2016-10-31 DIAGNOSIS — G8929 Other chronic pain: Secondary | ICD-10-CM

## 2016-10-31 DIAGNOSIS — R5383 Other fatigue: Secondary | ICD-10-CM | POA: Diagnosis not present

## 2016-10-31 DIAGNOSIS — K219 Gastro-esophageal reflux disease without esophagitis: Secondary | ICD-10-CM | POA: Diagnosis not present

## 2016-10-31 DIAGNOSIS — R300 Dysuria: Secondary | ICD-10-CM | POA: Diagnosis not present

## 2016-10-31 DIAGNOSIS — R1011 Right upper quadrant pain: Secondary | ICD-10-CM

## 2016-10-31 LAB — POCT URINALYSIS DIPSTICK
PH UA: 6
SPEC GRAV UA: 1.02

## 2016-10-31 MED ORDER — SUMATRIPTAN SUCCINATE 100 MG PO TABS: ORAL_TABLET | ORAL | 5 refills | Status: DC

## 2016-10-31 MED ORDER — CITALOPRAM HYDROBROMIDE 20 MG PO TABS: 20.0000 mg | ORAL_TABLET | Freq: Every day | ORAL | 5 refills | Status: DC

## 2016-10-31 MED ORDER — CEPHALEXIN 500 MG PO CAPS: 500.0000 mg | ORAL_CAPSULE | Freq: Four times a day (QID) | ORAL | 0 refills | Status: DC

## 2016-10-31 MED ORDER — TOPIRAMATE 50 MG PO TABS: 50.0000 mg | ORAL_TABLET | Freq: Two times a day (BID) | ORAL | 5 refills | Status: DC

## 2016-10-31 MED ORDER — ALPRAZOLAM 0.25 MG PO TABS: ORAL_TABLET | ORAL | 5 refills | Status: DC

## 2016-10-31 NOTE — Progress Notes (Signed)
   Subjective:    Patient ID: Alicia Owens, female    DOB: 04/18/1976, 41 y.o.   MRN: [id number]  Anxiety  Presents for follow-up visit. The quality of sleep is good.   Compliance with medications is 76-100%.   Patient wants her urine rechecked to make sure her UTI has cleared up.Patient denies high fever chills sweats she feels like urinary infection came back she did take Macrobid seemed to help a little bit.   Patient has an infected toenail that has been present for about 2 days now. Her toenails been infected seemingly getting better tender on the medial and lateral aspect  Patient with significant fatigue tiredness. Trying to excise try to lose weight having difficult time doing so  Reflux under good control watch his diet has to take medicine twice a day to keep it under control  Moods are doing well overall denies being depressed states anxiety under good control as well  Patient with chronic right upper quadrant discomfort with intermittent aches-no weight loss no bloody stools  Breathing status overall doing well not having any flareups of asthma  Allergies under good control  Uses Xanax intermittently/rarely Uses diuretic rarely  Review of Systems Denies high fever chills sweats chest pain or shortness of breath denies nausea vomiting diarrhea. Does relate some fatigue tiredness    Objective:   Physical Exam Lungs are clear no crackles heart is regular HEENT is benign extremities no edema abdomen is soft right upper quadrant area slight tenderness along the rib line Urinalysis negative Minimal ingrown toenail       Assessment & Plan:  Ingrown toenail showed her how to push back the lateral edge take antibiotics if ongoing troubles referral to podiatry  Urinalysis need to check urine culture finish out antibiotic  Fatigue check lab work  Anxiety under good control continue current measures Migraines under good control continue measures Reflux does well  medicine continue current measures IBS stable Asthma stable

## 2016-11-01 LAB — BASIC METABOLIC PANEL
BUN / CREAT RATIO: 9 (ref 9–23)
BUN: 7 mg/dL (ref 6–24)
CO2: 21 mmol/L (ref 18–29)
CREATININE: 0.75 mg/dL (ref 0.57–1.00)
Calcium: 8.8 mg/dL (ref 8.7–10.2)
Chloride: 102 mmol/L (ref 96–106)
GFR calc Af Amer: 115 mL/min/{1.73_m2} (ref >59)
GFR, EST NON AFRICAN AMERICAN: 100 mL/min/{1.73_m2} (ref >59)
Glucose: 88 mg/dL (ref 65–99)
POTASSIUM: 4.6 mmol/L (ref 3.5–5.2)
SODIUM: 139 mmol/L (ref 134–144)

## 2016-11-01 LAB — CBC WITH DIFFERENTIAL/PLATELET
Basophils Absolute: 0 10*3/uL (ref 0.0–0.2)
Basos: 0 %
EOS (ABSOLUTE): 0.3 10*3/uL (ref 0.0–0.4)
EOS: 3 %
HEMATOCRIT: 39.2 % (ref 34.0–46.6)
HEMOGLOBIN: 13.1 g/dL (ref 11.1–15.9)
IMMATURE GRANS (ABS): 0 10*3/uL (ref 0.0–0.1)
Immature Granulocytes: 0 %
LYMPHS ABS: 3 10*3/uL (ref 0.7–3.1)
LYMPHS: 29 %
MCH: 30.7 pg (ref 26.6–33.0)
MCHC: 33.4 g/dL (ref 31.5–35.7)
MCV: 92 fL (ref 79–97)
MONOCYTES: 5 %
Monocytes Absolute: 0.5 10*3/uL (ref 0.1–0.9)
NEUTROS ABS: 6.3 10*3/uL (ref 1.4–7.0)
Neutrophils: 63 %
Platelets: 347 10*3/uL (ref 150–379)
RBC: 4.27 x10E6/uL (ref 3.77–5.28)
RDW: 13.2 % (ref 12.3–15.4)
WBC: 10.2 10*3/uL (ref 3.4–10.8)

## 2016-11-01 LAB — HEPATIC FUNCTION PANEL
ALBUMIN: 4.4 g/dL (ref 3.5–5.5)
ALK PHOS: 67 IU/L (ref 39–117)
ALT: 21 IU/L (ref 0–32)
AST: 23 IU/L (ref 0–40)
BILIRUBIN, DIRECT: 0.07 mg/dL (ref 0.00–0.40)
Bilirubin Total: 0.3 mg/dL (ref 0.0–1.2)
TOTAL PROTEIN: 7.3 g/dL (ref 6.0–8.5)

## 2016-11-01 LAB — TSH: TSH: 0.976 u[IU]/mL (ref 0.450–4.500)

## 2016-11-01 LAB — T4, FREE: FREE T4: 0.84 ng/dL (ref 0.82–1.77)

## 2016-11-04 LAB — PLEASE NOTE

## 2016-11-04 LAB — URINE CULTURE

## 2016-11-05 ENCOUNTER — Other Ambulatory Visit: Payer: Self-pay | Admitting: Family Medicine

## 2016-11-05 MED ORDER — CIPROFLOXACIN HCL 500 MG PO TABS: 500.0000 mg | ORAL_TABLET | Freq: Two times a day (BID) | ORAL | 0 refills | Status: DC

## 2016-11-15 ENCOUNTER — Telehealth: Payer: Self-pay | Admitting: Family Medicine

## 2016-11-15 MED ORDER — AMOXICILLIN-POT CLAVULANATE 875-125 MG PO TABS: 1.0000 | ORAL_TABLET | Freq: Two times a day (BID) | ORAL | 0 refills | Status: AC

## 2016-11-15 NOTE — Telephone Encounter (Signed)
Patient was put on keflex for UTI 10/31/16 an changed to Cipro 11/05/16 and is still having dysuria- see urine culture from 10/31/16

## 2016-11-15 NOTE — Telephone Encounter (Signed)
Left message on voicemail to return call [facility]

## 2016-11-15 NOTE — Telephone Encounter (Signed)
I recommend Augmentin 875 mg one twice a day for the next 10 days take with a snack to lessen the risk of upset stomach-patient should consider giving another urine for culture in approximately 7-10 days after taking the antibiotics

## 2016-11-15 NOTE — Telephone Encounter (Signed)
Patient says that she is still having issues with UTI symptoms and has two days of her cipro left.  She would like a nurse to call her back and advise her what she should do.

## 2016-11-16 ENCOUNTER — Other Ambulatory Visit: Payer: Self-pay | Admitting: Family Medicine

## 2016-11-16 NOTE — Telephone Encounter (Signed)
May we refill patient last seen 10/31/16

## 2016-11-17 NOTE — Telephone Encounter (Signed)
Patient notified and advised -patient should consider giving another urine for culture in approximately 7-10 days after taking the antibiotics. Patient verbalized understanding and stated she will call back to schedule recheck

## 2016-11-29 ENCOUNTER — Encounter: Payer: Self-pay | Admitting: Family Medicine

## 2016-12-05 ENCOUNTER — Telehealth: Payer: Self-pay | Admitting: Family Medicine

## 2016-12-05 NOTE — Telephone Encounter (Signed)
Review left breast ultrasound from [facility] in results folder.

## 2016-12-06 NOTE — Telephone Encounter (Signed)
Benign they're doing a follow-up in 6 months they have communicated to the patient therefore this can be filed

## 2016-12-08 ENCOUNTER — Encounter: Payer: Self-pay | Admitting: Family Medicine

## 2016-12-08 ENCOUNTER — Ambulatory Visit (INDEPENDENT_AMBULATORY_CARE_PROVIDER_SITE_OTHER): Payer: 59 | Admitting: Family Medicine

## 2016-12-08 VITALS — BP 118/74 | Temp 97.8°F | Ht 61.5 in | Wt 185.0 lb

## 2016-12-08 DIAGNOSIS — J019 Acute sinusitis, unspecified: Secondary | ICD-10-CM

## 2016-12-08 DIAGNOSIS — R300 Dysuria: Secondary | ICD-10-CM | POA: Diagnosis not present

## 2016-12-08 LAB — POCT URINALYSIS DIPSTICK: pH, UA: 7 (ref 5.0–8.0)

## 2016-12-08 MED ORDER — CEFDINIR 300 MG PO CAPS: 300.0000 mg | ORAL_CAPSULE | Freq: Two times a day (BID) | ORAL | 0 refills | Status: DC

## 2016-12-08 NOTE — Progress Notes (Signed)
   Subjective:    Patient ID: Alicia Owens, female    DOB: 05-30-1976, 41 y.o.   MRN: [id number]  Urinary Tract Infection   This is a recurrent problem. Episode onset: 2 days. Associated symptoms comments: Painful urination, abd pain,. Treatments tried: ciprofloxacin.   Sore throat, runny nose. Started over one week ago.  Runny nose sinus pressure drainage coughing the right side of her neck in the posterior aspect mouth tenderness  Denies any severe abdominal pain has some soreness on the right side denies any other sweats chills vomiting diarrhea.  Review of Systems  Constitutional: Negative for activity change and fever.  HENT: Positive for congestion and rhinorrhea. Negative for ear pain.   Eyes: Negative for discharge.  Respiratory: Positive for cough. Negative for shortness of breath and wheezing.   Cardiovascular: Negative for chest pain.       Objective:   Physical Exam  Constitutional: She appears well-developed.  HENT:  Head: Normocephalic.  Nose: Nose normal.  Mouth/Throat: Oropharynx is clear and moist. No oropharyngeal exudate.  Neck: Neck supple.  Cardiovascular: Normal rate and normal heart sounds.   No murmur heard. Pulmonary/Chest: Effort normal and breath sounds normal. She has no wheezes.  Lymphadenopathy:    She has no cervical adenopathy.  Skin: Skin is warm and dry.  Nursing note and vitals reviewed.         Assessment & Plan:  Viral illness Secondary rhinosinusitis Reoccurring UTI Cranberry tablets daily Plenty of liquids frequent urination recommended.

## 2016-12-14 LAB — URINE CULTURE

## 2016-12-26 ENCOUNTER — Other Ambulatory Visit: Payer: Self-pay | Admitting: Family Medicine

## 2016-12-26 ENCOUNTER — Other Ambulatory Visit: Payer: Self-pay | Admitting: *Deleted

## 2016-12-26 DIAGNOSIS — N390 Urinary tract infection, site not specified: Secondary | ICD-10-CM

## 2016-12-28 ENCOUNTER — Telehealth: Payer: Self-pay | Admitting: *Deleted

## 2016-12-28 NOTE — Telephone Encounter (Signed)
Pt notified. Pt would like a call back when it comes in.

## 2016-12-28 NOTE — Telephone Encounter (Signed)
Await urine culture

## 2016-12-28 NOTE — Telephone Encounter (Signed)
Left message to return call to let pt know urine culture is not back yet. Probably be tomorrow. Was sent on 4/24

## 2016-12-28 NOTE — Telephone Encounter (Signed)
Requesting urine culture results  ?

## 2016-12-29 LAB — URINE CULTURE

## 2017-01-01 MED ORDER — CIPROFLOXACIN HCL 500 MG PO TABS: ORAL_TABLET | ORAL | 0 refills | Status: DC

## 2017-01-01 NOTE — Telephone Encounter (Signed)
Pt wants urine culture results

## 2017-01-01 NOTE — Telephone Encounter (Signed)
Spoke with patient and informed her per Dr.Scott Luking- Urine culture does show gram-negative bacteria. Further testing is still pending. Dr.Scott would recommend Cipro 500 Mg 1 twice a day for the next 7 days. Patient verbalized understanding.

## 2017-01-01 NOTE — Telephone Encounter (Signed)
Urine culture does show gram-negative bacteria-further testing is still pending-I would recommend Cipro 500 mg 1 twice a day for the next 7 days

## 2017-01-23 ENCOUNTER — Telehealth: Payer: Self-pay | Admitting: Family Medicine

## 2017-01-23 ENCOUNTER — Encounter: Payer: Self-pay | Admitting: Family Medicine

## 2017-01-23 ENCOUNTER — Other Ambulatory Visit: Payer: Self-pay | Admitting: *Deleted

## 2017-01-23 DIAGNOSIS — R300 Dysuria: Secondary | ICD-10-CM

## 2017-01-23 MED ORDER — CIPROFLOXACIN HCL 500 MG PO TABS: 500.0000 mg | ORAL_TABLET | Freq: Two times a day (BID) | ORAL | 0 refills | Status: DC

## 2017-01-23 NOTE — Telephone Encounter (Signed)
I recommend Cipro 500 mg 1 twice a day for 10 days also recommend for the patient to give a urine for a urine culture in approximately 2-3 weeks

## 2017-01-23 NOTE — Telephone Encounter (Signed)
Patient was seen 4/18, 3/18 and 2/18 for dysuria

## 2017-01-23 NOTE — Telephone Encounter (Signed)
Pt wants to know if you think she should see urology. If so she would like to see [facility] dr Annabell HowellsWrenn is first choice but whoever can see her the quickest.

## 2017-01-23 NOTE — Telephone Encounter (Signed)
Spoke with patient and informed her per Dr.Scott Luking- we are sending referring to urology. Patient verbalized understanding.

## 2017-01-23 NOTE — Telephone Encounter (Signed)
Discussed with pt. Med sent to pharm.  

## 2017-01-23 NOTE — Telephone Encounter (Signed)
Patient said she was supposed to come in the office next week and leave a urine specimen.  She said she thinks she has a UTI now and would like to know if Dr. Lorin PicketScott will go ahead and call her something in or if she can leave a specimen in the morning.  [facility]

## 2017-01-23 NOTE — Telephone Encounter (Signed)
Go ahead to urology referral preferred Dr. Annabell HowellsWrenn but will accept other urology. Still go forward with doing the antibiotics

## 2017-02-18 ENCOUNTER — Telehealth: Payer: Self-pay | Admitting: Family Medicine

## 2017-02-18 ENCOUNTER — Encounter: Payer: Self-pay | Admitting: Family Medicine

## 2017-02-18 NOTE — Telephone Encounter (Signed)
A letter was sent to the patient discussing the potential side effects and interactions of citalopram and sumatriptan can. She was encouraged to follow-up to discuss this further.

## 2017-04-28 ENCOUNTER — Other Ambulatory Visit: Payer: Self-pay | Admitting: Family Medicine

## 2017-04-30 NOTE — Telephone Encounter (Signed)
May have this with 2 refills needs office visit this fall

## 2017-05-01 ENCOUNTER — Other Ambulatory Visit: Payer: Self-pay | Admitting: Family Medicine

## 2017-05-08 ENCOUNTER — Ambulatory Visit (INDEPENDENT_AMBULATORY_CARE_PROVIDER_SITE_OTHER): Payer: 59 | Admitting: Family Medicine

## 2017-05-08 ENCOUNTER — Encounter: Payer: Self-pay | Admitting: Family Medicine

## 2017-05-08 VITALS — BP 126/82 | Ht 61.5 in | Wt 191.2 lb

## 2017-05-08 DIAGNOSIS — J4530 Mild persistent asthma, uncomplicated: Secondary | ICD-10-CM

## 2017-05-08 DIAGNOSIS — K589 Irritable bowel syndrome without diarrhea: Secondary | ICD-10-CM

## 2017-05-08 DIAGNOSIS — K219 Gastro-esophageal reflux disease without esophagitis: Secondary | ICD-10-CM | POA: Diagnosis not present

## 2017-05-08 DIAGNOSIS — R600 Localized edema: Secondary | ICD-10-CM

## 2017-05-08 DIAGNOSIS — G43009 Migraine without aura, not intractable, without status migrainosus: Secondary | ICD-10-CM | POA: Diagnosis not present

## 2017-05-08 LAB — POCT URINALYSIS DIPSTICK
SPEC GRAV UA: 1.015 (ref 1.010–1.025)
pH, UA: 6 (ref 5.0–8.0)

## 2017-05-08 MED ORDER — CITALOPRAM HYDROBROMIDE 20 MG PO TABS: 20.0000 mg | ORAL_TABLET | Freq: Every day | ORAL | 5 refills | Status: DC

## 2017-05-08 MED ORDER — POTASSIUM CHLORIDE ER 10 MEQ PO TBCR: EXTENDED_RELEASE_TABLET | ORAL | 2 refills | Status: DC

## 2017-05-08 MED ORDER — TOPIRAMATE 50 MG PO TABS: 50.0000 mg | ORAL_TABLET | Freq: Two times a day (BID) | ORAL | 5 refills | Status: DC

## 2017-05-08 MED ORDER — DOXYCYCLINE HYCLATE 100 MG PO CAPS: 100.0000 mg | ORAL_CAPSULE | Freq: Every day | ORAL | 0 refills | Status: DC

## 2017-05-08 MED ORDER — BUSPIRONE HCL 15 MG PO TABS: ORAL_TABLET | ORAL | 6 refills | Status: DC

## 2017-05-08 MED ORDER — ALBUTEROL SULFATE HFA 108 (90 BASE) MCG/ACT IN AERS: 2.0000 | INHALATION_SPRAY | Freq: Four times a day (QID) | RESPIRATORY_TRACT | 4 refills | Status: DC | PRN

## 2017-05-08 MED ORDER — DICYCLOMINE HCL 20 MG PO TABS
ORAL_TABLET | ORAL | 12 refills | Status: DC
Start: 2017-05-08 — End: 2019-03-14

## 2017-05-08 MED ORDER — FUROSEMIDE 20 MG PO TABS: ORAL_TABLET | ORAL | 2 refills | Status: DC

## 2017-05-08 MED ORDER — ALPRAZOLAM 0.25 MG PO TABS: ORAL_TABLET | ORAL | 5 refills | Status: DC

## 2017-05-08 MED ORDER — OMEPRAZOLE 20 MG PO CPDR: DELAYED_RELEASE_CAPSULE | ORAL | 6 refills | Status: DC

## 2017-05-08 NOTE — Patient Instructions (Signed)
DASH Eating Plan DASH stands for "Dietary Approaches to Stop Hypertension." The DASH eating plan is a healthy eating plan that has been shown to reduce high blood pressure (hypertension). It may also reduce your risk for type 2 diabetes, heart disease, and stroke. The DASH eating plan may also help with weight loss. What are tips for following this plan? General guidelines  Avoid eating more than 2,300 mg (milligrams) of salt (sodium) a day. If you have hypertension, you may need to reduce your sodium intake to 1,500 mg a day.  Limit alcohol intake to no more than 1 drink a day for nonpregnant women and 2 drinks a day for men. One drink equals 12 oz of beer, 5 oz of wine, or 1 oz of hard liquor.  Work with your health care provider to maintain a healthy body weight or to lose weight. Ask what an ideal weight is for you.  Get at least 30 minutes of exercise that causes your heart to beat faster (aerobic exercise) most days of the week. Activities may include walking, swimming, or biking.  Work with your health care provider or diet and nutrition specialist (dietitian) to adjust your eating plan to your individual calorie needs. Reading food labels  Check food labels for the amount of sodium per serving. Choose foods with less than 5 percent of the Daily Value of sodium. Generally, foods with less than 300 mg of sodium per serving fit into this eating plan.  To find whole grains, look for the word "whole" as the first word in the ingredient list. Shopping  Buy products labeled as "low-sodium" or "no salt added."  Buy fresh foods. Avoid canned foods and premade or frozen meals. Cooking  Avoid adding salt when cooking. Use salt-free seasonings or herbs instead of table salt or sea salt. Check with your health care provider or pharmacist before using salt substitutes.  Do not fry foods. Cook foods using healthy methods such as baking, boiling, grilling, and broiling instead.  Cook with  heart-healthy oils, such as olive, canola, soybean, or sunflower oil. Meal planning   Eat a balanced diet that includes: ? 5 or more servings of fruits and vegetables each day. At each meal, try to fill half of your plate with fruits and vegetables. ? Up to 6-8 servings of whole grains each day. ? Less than 6 oz of lean meat, poultry, or fish each day. A 3-oz serving of meat is about the same size as a deck of cards. One egg equals 1 oz. ? 2 servings of low-fat dairy each day. ? A serving of nuts, seeds, or beans 5 times each week. ? Heart-healthy fats. Healthy fats called Omega-3 fatty acids are found in foods such as flaxseeds and coldwater fish, like sardines, salmon, and mackerel.  Limit how much you eat of the following: ? Canned or prepackaged foods. ? Food that is high in trans fat, such as fried foods. ? Food that is high in saturated fat, such as fatty meat. ? Sweets, desserts, sugary drinks, and other foods with added sugar. ? Full-fat dairy products.  Do not salt foods before eating.  Try to eat at least 2 vegetarian meals each week.  Eat more home-cooked food and less restaurant, buffet, and fast food.  When eating at a restaurant, ask that your food be prepared with less salt or no salt, if possible. What foods are recommended? The items listed may not be a complete list. Talk with your dietitian about what   dietary choices are best for you. Grains Whole-grain or whole-wheat bread. Whole-grain or whole-wheat pasta. Brown rice. Oatmeal. Quinoa. Bulgur. Whole-grain and low-sodium cereals. Pita bread. Low-fat, low-sodium crackers. Whole-wheat flour tortillas. Vegetables Fresh or frozen vegetables (raw, steamed, roasted, or grilled). Low-sodium or reduced-sodium tomato and vegetable juice. Low-sodium or reduced-sodium tomato sauce and tomato paste. Low-sodium or reduced-sodium canned vegetables. Fruits All fresh, dried, or frozen fruit. Canned fruit in natural juice (without  added sugar). Meat and other protein foods Skinless chicken or turkey. Ground chicken or turkey. Pork with fat trimmed off. Fish and seafood. Egg whites. Dried beans, peas, or lentils. Unsalted nuts, nut butters, and seeds. Unsalted canned beans. Lean cuts of beef with fat trimmed off. Low-sodium, lean deli meat. Dairy Low-fat (1%) or fat-free (skim) milk. Fat-free, low-fat, or reduced-fat cheeses. Nonfat, low-sodium ricotta or cottage cheese. Low-fat or nonfat yogurt. Low-fat, low-sodium cheese. Fats and oils Soft margarine without trans fats. Vegetable oil. Low-fat, reduced-fat, or light mayonnaise and salad dressings (reduced-sodium). Canola, safflower, olive, soybean, and sunflower oils. Avocado. Seasoning and other foods Herbs. Spices. Seasoning mixes without salt. Unsalted popcorn and pretzels. Fat-free sweets. What foods are not recommended? The items listed may not be a complete list. Talk with your dietitian about what dietary choices are best for you. Grains Baked goods made with fat, such as croissants, muffins, or some breads. Dry pasta or rice meal packs. Vegetables Creamed or fried vegetables. Vegetables in a cheese sauce. Regular canned vegetables (not low-sodium or reduced-sodium). Regular canned tomato sauce and paste (not low-sodium or reduced-sodium). Regular tomato and vegetable juice (not low-sodium or reduced-sodium). Pickles. Olives. Fruits Canned fruit in a light or heavy syrup. Fried fruit. Fruit in cream or butter sauce. Meat and other protein foods Fatty cuts of meat. Ribs. Fried meat. Bacon. Sausage. Bologna and other processed lunch meats. Salami. Fatback. Hotdogs. Bratwurst. Salted nuts and seeds. Canned beans with added salt. Canned or smoked fish. Whole eggs or egg yolks. Chicken or turkey with skin. Dairy Whole or 2% milk, cream, and half-and-half. Whole or full-fat cream cheese. Whole-fat or sweetened yogurt. Full-fat cheese. Nondairy creamers. Whipped toppings.  Processed cheese and cheese spreads. Fats and oils Butter. Stick margarine. Lard. Shortening. Ghee. Bacon fat. Tropical oils, such as coconut, palm kernel, or palm oil. Seasoning and other foods Salted popcorn and pretzels. Onion salt, garlic salt, seasoned salt, table salt, and sea salt. Worcestershire sauce. Tartar sauce. Barbecue sauce. Teriyaki sauce. Soy sauce, including reduced-sodium. Steak sauce. Canned and packaged gravies. Fish sauce. Oyster sauce. Cocktail sauce. Horseradish that you find on the shelf. Ketchup. Mustard. Meat flavorings and tenderizers. Bouillon cubes. Hot sauce and Tabasco sauce. Premade or packaged marinades. Premade or packaged taco seasonings. Relishes. Regular salad dressings. Where to find more information:  National Heart, Lung, and Blood Institute: www.nhlbi.nih.gov  American Heart Association: www.heart.org Summary  The DASH eating plan is a healthy eating plan that has been shown to reduce high blood pressure (hypertension). It may also reduce your risk for type 2 diabetes, heart disease, and stroke.  With the DASH eating plan, you should limit salt (sodium) intake to 2,300 mg a day. If you have hypertension, you may need to reduce your sodium intake to 1,500 mg a day.  When on the DASH eating plan, aim to eat more fresh fruits and vegetables, whole grains, lean proteins, low-fat dairy, and heart-healthy fats.  Work with your health care provider or diet and nutrition specialist (dietitian) to adjust your eating plan to your individual   calorie needs. This information is not intended to replace advice given to you by your health care provider. Make sure you discuss any questions you have with your health care provider. Document Released: 08/10/2011 Document Revised: 08/14/2016 Document Reviewed: 08/14/2016 Elsevier Interactive Patient Education  2017 Elsevier Inc.  

## 2017-05-08 NOTE — Progress Notes (Signed)
Subjective:    Patient ID: Alicia Owens, female    DOB: 06/19/1976, 41 y.o.   MRN: [id number]  HPI Patient arrives for a follow up on depression and blood pressure. Her blood pressure overall is been doing well she states she is watching salt in her diet. Not taking any medications. Occasionally taking diuretic for swelling.  Patient reports swelling in feet and hands.  She states recently not been taking the diuretic she ran out she has swelling that occurs more so in the feet to where her feet and ankles get puffy this occurs multiple days per week. Seems to go away once she lays down she denies any breathing troubles chest pressure pain abdominal swelling nausea or vomiting denies fever chills sweats denies dysuria or urinary frequency. She relates her moods overall been doing good at times stress but medication helps she denies being depressed currently. She is had this problem for years. Patient also states she is having breaking out spells under her arms. She relates a papular rash underneath the arms where the bumps are breaking out causing some discomfort no drainage from them no fevers. She does shave under her arms Her irritable bowel is stable she watches diet she stays active has to take anti-spasmodic intermittently She relates she's been having significant trouble with her weight gain she does try to watch her diet it's been difficult for her to exercise on a regular basis but she is trying to get some exercise in   Review of Systems  Constitutional: Negative for activity change, appetite change and fatigue.  HENT: Negative for congestion.   Respiratory: Negative for cough.   Cardiovascular: Negative for chest pain.  Gastrointestinal: Negative for abdominal pain.  Endocrine: Negative for polydipsia and polyphagia.  Musculoskeletal: Negative for arthralgias and back pain.  Skin: Negative for color change.  Neurological: Negative for weakness.  Psychiatric/Behavioral: Negative  for confusion.       Objective:   Physical Exam  Constitutional: She appears well-developed and well-nourished. No distress.  HENT:  Head: Normocephalic and atraumatic.  Eyes: Right eye exhibits no discharge. Left eye exhibits no discharge.  Neck: No tracheal deviation present.  Cardiovascular: Normal rate, regular rhythm and normal heart sounds.   No murmur heard. Pulmonary/Chest: Effort normal and breath sounds normal. No respiratory distress. She has no wheezes. She has no rales.  Musculoskeletal: She exhibits no edema.  Lymphadenopathy:    She has no cervical adenopathy.  Neurological: She is alert. She exhibits normal muscle tone.  Skin: Skin is warm and dry. No erythema.  Psychiatric: Her behavior is normal.  Vitals reviewed.        25 minutes was spent with the patient. Greater than half the time was spent in discussion and answering questions and counseling regarding the issues that the patient came in for today.  Assessment & Plan:  Pedal edema-urinalysis negative for protein. I believe the best course would be doing Lasix 1 in morning when necessary for swelling take a potassium with it. Check a metabolic 7 in a few weeks time  IBS under good control continue current measures when necessary for abdominal cramping  Reflux paced states she must take PPI twice daily to keep it under control otherwise she gets burning therefore continue this  Migraine stable continue her Topamax and her Imitrex when necessary  Papular rash/folliculitis under the arms doxycycline 1 daily for the next 2 weeks  Depression/anxiety stress-overall patient doing fairly well denies being depressed currently states medication  does help she would like to continue it  Asthma stable needs refills on medicine  Follow-up 6 months

## 2017-05-14 ENCOUNTER — Other Ambulatory Visit: Payer: Self-pay | Admitting: Family Medicine

## 2017-05-29 ENCOUNTER — Telehealth: Payer: Self-pay | Admitting: Family Medicine

## 2017-05-29 NOTE — Telephone Encounter (Signed)
Review mammogram results from [facility] in results folder. °

## 2017-05-31 NOTE — Telephone Encounter (Signed)
Mammogram negative, results he screened into the system, please send the patient a card letting her know her mammogram was normal repeat in 1 year

## 2017-05-31 NOTE — Telephone Encounter (Signed)
Note mailed.

## 2017-06-13 ENCOUNTER — Other Ambulatory Visit: Payer: Self-pay | Admitting: Family Medicine

## 2017-06-21 LAB — BASIC METABOLIC PANEL
BUN / CREAT RATIO: 8 — AB (ref 9–23)
BUN: 7 mg/dL (ref 6–24)
CALCIUM: 9.4 mg/dL (ref 8.7–10.2)
CHLORIDE: 105 mmol/L (ref 96–106)
CO2: 22 mmol/L (ref 20–29)
Creatinine, Ser: 0.89 mg/dL (ref 0.57–1.00)
GFR, EST AFRICAN AMERICAN: 93 mL/min/{1.73_m2} (ref >59)
GFR, EST NON AFRICAN AMERICAN: 81 mL/min/{1.73_m2} (ref >59)
Glucose: 99 mg/dL (ref 65–99)
POTASSIUM: 4.9 mmol/L (ref 3.5–5.2)
SODIUM: 141 mmol/L (ref 134–144)

## 2017-07-03 ENCOUNTER — Ambulatory Visit (INDEPENDENT_AMBULATORY_CARE_PROVIDER_SITE_OTHER): Payer: 59 | Admitting: *Deleted

## 2017-07-03 DIAGNOSIS — Z23 Encounter for immunization: Secondary | ICD-10-CM

## 2017-07-11 ENCOUNTER — Encounter: Payer: Self-pay | Admitting: Family Medicine

## 2017-07-11 ENCOUNTER — Ambulatory Visit: Payer: 59 | Admitting: Family Medicine

## 2017-07-11 VITALS — BP 144/94 | Temp 98.3°F | Wt 193.5 lb

## 2017-07-11 DIAGNOSIS — B309 Viral conjunctivitis, unspecified: Secondary | ICD-10-CM

## 2017-07-11 NOTE — Patient Instructions (Signed)
Viral Conjunctivitis, Adult Viral conjunctivitis is an inflammation of the clear membrane that covers the white part of your eye and the inner surface of your eyelid (conjunctiva). The inflammation is caused by a viral infection. The blood vessels in the conjunctiva become inflamed, causing the eye to become red or pink, and often itchy. Viral conjunctivitis can be easily passed from one person to another (is contagious). This condition is often called pink eye. What are the causes? This condition is caused by a virus. A virus is a type of contagious germ. It can be spread by touching objects that have been contaminated with the virus, such as doorknobs or towels. It can also be passed through droplets, such as from coughing or sneezing. What are the signs or symptoms? Symptoms of this condition include:  Eye redness.  Tearing or watery eyes.  Itchy and irritated eyes.  Burning feeling in the eyes.  Clear drainage from the eye.  Swollen eyelids.  A gritty feeling in the eye.  Light sensitivity. This condition often occurs with other symptoms, such as a fever, nausea, or a rash. How is this diagnosed? This condition is diagnosed with a medical history and physical exam. If you have discharge from your eye, the discharge may be tested to rule out other causes of conjunctivitis. How is this treated? Viral conjunctivitis does not respond to medicines that kill bacteria (antibiotics). Treatment for viral conjunctivitis is directed at stopping a bacterial infection from developing in addition to the viral infection. Treatment also aims to relieve your symptoms, such as itching. This may be done with antihistamine drops or other eye medicines. Rarely, steroid eye drops or antiviral medicines may be prescribed. Follow these instructions at home: Medicines    Take or apply over-the-counter and prescription medicines only as told by your health care provider.  Be very careful to avoid touching  the edge of the eyelid with the eye drop bottle or ointment tube when applying medicines to the affected eye. Being careful this way will stop you from spreading the infection to the other eye or to other people. Eye care   Avoid touching or rubbing your eyes.  Apply a warm, wet, clean washcloth to your eye for 10-20 minutes, 3-4 times per day or as told by your health care provider.  If you wear contact lenses, do not wear them until the inflammation is gone and your health care provider says it is safe to wear them again. Ask your health care provider how to sterilize or replace your contact lenses before using them again. Wear glasses until you can resume wearing contacts.  Avoid wearing eye makeup until the inflammation is gone. Throw away any old eye cosmetics that may be contaminated.  Gently wipe away any drainage from your eye with a warm, wet washcloth or a cotton ball. General instructions   Change or wash your pillowcase every day or as told by your health care provider.  Do not share towels, pillowcases, washcloths, eye makeup, makeup brushes, contact lenses, or glasses. This may spread the infection.  Wash your hands often with soap and water. Use paper towels to dry your hands. If soap and water are not available, use hand sanitizer.  Try to avoid contact with other people for one week or as told by your health care provider. Contact a health care provider if:  Your symptoms do not improve with treatment or they get worse.  You have increased pain.  Your vision becomes blurry.  You   have a fever.  You have facial pain, redness, or swelling.  You have yellow or green drainage coming from your eye.  You have new symptoms. This information is not intended to replace advice given to you by your health care provider. Make sure you discuss any questions you have with your health care provider. Document Released: 11/11/2002 Document Revised: 03/18/2016 Document Reviewed:  03/07/2016 Elsevier Interactive Patient Education  2017 Elsevier Inc.  

## 2017-07-11 NOTE — Progress Notes (Signed)
   Subjective:    Patient ID: Alicia Owens, female    DOB: 12/08/1975, 41 y.o.   MRN: [id number]  Eye Pain   The left eye is affected. This is a new problem. The current episode started yesterday. There was no injury mechanism. Associated symptoms include an eye discharge and eye redness. Associated symptoms comments: Swelling . She has tried eye drops (Visine Allergy drops ) for the symptoms. The treatment provided no relief.   Patient has child with similar symptoms in both eyes.  Primarily when I this time no fevers or chills.  No cough.  Some irritation.  Slight crustiness.  Slight discharge slight injection crustiness involving Patient's mother states no other concerns this visit.  Review of Systems  Eyes: Positive for pain, discharge and redness.       Objective:   Physical Exam  Alert vitals stable, NAD. Blood pressure good on repeat. HEENT normal. Lungs clear. Heart regular rate and rhythm. Slight injection crustiness of      Assessment & Plan:  Impression conjunctivitis.  Likely viral symptom care discussed warning signs discussed

## 2017-08-10 ENCOUNTER — Ambulatory Visit: Payer: 59 | Admitting: Family Medicine

## 2017-08-10 ENCOUNTER — Encounter: Payer: Self-pay | Admitting: Family Medicine

## 2017-08-10 DIAGNOSIS — J019 Acute sinusitis, unspecified: Secondary | ICD-10-CM | POA: Diagnosis not present

## 2017-08-10 DIAGNOSIS — J4521 Mild intermittent asthma with (acute) exacerbation: Secondary | ICD-10-CM

## 2017-08-10 DIAGNOSIS — J4530 Mild persistent asthma, uncomplicated: Secondary | ICD-10-CM

## 2017-08-10 MED ORDER — AMOXICILLIN-POT CLAVULANATE 875-125 MG PO TABS: 1.0000 | ORAL_TABLET | Freq: Two times a day (BID) | ORAL | 0 refills | Status: DC

## 2017-08-10 MED ORDER — ALBUTEROL SULFATE HFA 108 (90 BASE) MCG/ACT IN AERS: 2.0000 | INHALATION_SPRAY | Freq: Four times a day (QID) | RESPIRATORY_TRACT | 4 refills | Status: DC | PRN

## 2017-08-10 MED ORDER — PREDNISONE 20 MG PO TABS: ORAL_TABLET | ORAL | 0 refills | Status: DC

## 2017-08-10 MED ORDER — FLUTICASONE PROPIONATE HFA 220 MCG/ACT IN AERO: INHALATION_SPRAY | RESPIRATORY_TRACT | 6 refills | Status: DC

## 2017-08-10 NOTE — Progress Notes (Signed)
   Subjective:    Patient ID: Alicia Owens, female    DOB: 08/14/1976, 41 y.o.   MRN: [id number]  Sinusitis  This is a new problem. Episode onset: over one week. Associated symptoms include congestion, coughing and a sore throat. (Wheezing)  Viral syndrome the past couple weeks head congestion drainage some sneezing in addition to this some and tightness in her lungs when she takes a deep breath    Review of Systems  HENT: Positive for congestion and sore throat.   Respiratory: Positive for cough.   Patient denies any vomiting diarrhea     Objective:   Physical Exam  Lungs are tight with some scattered wheezing no rhonchi no crackles no sign of pneumonia HEENT benign      Assessment & Plan:  Viral syndrome Reactive airway Asthmatic bronchitis Secondary infection X-rays lab work not indicated Patient to do steroid inhaler on a regular basis prednisone taper albuterol Warning signs discussed Follow-up if progressive troubles or if worse

## 2017-08-27 ENCOUNTER — Other Ambulatory Visit: Payer: Self-pay | Admitting: Family Medicine

## 2017-10-08 ENCOUNTER — Telehealth: Payer: Self-pay | Admitting: Family Medicine

## 2017-10-08 NOTE — Telephone Encounter (Signed)
Please inform the patient that her Flovent was denied but there are other medications that are very similar that are approved.  Her pharmacy plan with her insurance restricts Flovent and requires the use of other steroid inhalers.  This is a common issue.  Typically we recommend trying 1 of the ones that is covered by her insurance.  Qvar redihaler, Alvesco, Asmanex HFA-all of these medication should work well for her.  If she is interested we could send in a Qvar to take the place of the Flovent

## 2017-10-08 NOTE — Telephone Encounter (Signed)
Rx prior auth DENIED for pt's fluticasone (FLOVENT HFA) 220 MCG/ACT inhaler   Please see denial letter in red folder in yellow box - please advise  Pt must fail, have an intolerance to, or have a contraindication to  Qvar Redihaler, Alvesco, and Asmanex TwistHaler/HFA per [employer] guidelines    Pt uses [facility]

## 2017-10-09 NOTE — Telephone Encounter (Signed)
I called and left a message asked that she r/c. 

## 2017-10-11 ENCOUNTER — Other Ambulatory Visit: Payer: Self-pay | Admitting: *Deleted

## 2017-10-11 MED ORDER — BECLOMETHASONE DIPROP HFA 80 MCG/ACT IN AERB: 2.0000 | INHALATION_SPRAY | Freq: Two times a day (BID) | RESPIRATORY_TRACT | 5 refills | Status: DC

## 2017-10-11 NOTE — Telephone Encounter (Signed)
Qvar Redihaler 80 mcg 2 puffs twice daily for 5 refills-discontinue Flovent

## 2017-10-11 NOTE — Telephone Encounter (Signed)
qvar sent to pharm and flovent taken off of med list.

## 2017-10-11 NOTE — Telephone Encounter (Signed)
Patient advised and stated she is willing to try the qvar

## 2017-11-08 ENCOUNTER — Encounter: Payer: Self-pay | Admitting: Family Medicine

## 2017-11-09 ENCOUNTER — Telehealth: Payer: Self-pay | Admitting: *Deleted

## 2017-11-09 NOTE — Telephone Encounter (Signed)
Left message to return call.   Nurses, toocomplicated, plz speak with pt and get a clear idea of what she is experiencing and what she would like to get for it  ----- Message -----  From: [place], Ikenna Ohms M, LPN  Sent: 4/0/98113/04/2018  8:13 AM  To: Merlyn AlbertWilliam S Luking, MD  Subject: FW: Non-Urgent Medical Question               ----- Message -----  From: Crissie FiguresHolly B Zukas  Sent: 11/08/2017  8:17 PM  To: [facility] Clinical Pool  Subject: Non-Urgent Medical Question             ----- Message from Mychart, Generic sent at 11/08/2017 8:17 PM EST -----    Elvina SidleHey, Dr. Lorin PicketScott, this new inhaler has caused me to get thrush...I have tried to upload a pic, but it will not upload. I have white on the back of my tongue, white bumps on the side of my tongue and a couple of white bumps on the rough of my mouth. I have also unfortunately passed it to [place]. We have been swishing with vinegar water, but it is not clearing it up.   Also, I have my 6 month check up scheduled for March 20th, but I need a refill on my topiramate before then so I will have enough to last till then.   Thank you, I hope you have good evening!

## 2017-11-12 NOTE — Telephone Encounter (Signed)
Attempted to reach pt left message on voicemail.

## 2017-11-14 ENCOUNTER — Other Ambulatory Visit: Payer: Self-pay | Admitting: Family Medicine

## 2017-11-14 ENCOUNTER — Other Ambulatory Visit: Payer: Self-pay

## 2017-11-14 MED ORDER — TOPIRAMATE 50 MG PO TABS: 50.0000 mg | ORAL_TABLET | Freq: Two times a day (BID) | ORAL | 5 refills | Status: DC

## 2017-11-14 MED ORDER — NYSTATIN 100000 UNIT/ML MT SUSP: OROMUCOSAL | 5 refills | Status: DC

## 2017-11-14 NOTE — Telephone Encounter (Signed)
It is quite possible that the white spots on her tongue your are related to the possibility of thrush given that she does use steroid inhalers.  I would recommend nystatin oral solution.  1 teaspoon swish and swallow 4 times daily for 7 days.  She may have 5 refills on this medicine.  Please let the patient know that periodically this could occur and she does have refills.  Also very important for the patient to rinse her mouth with water after use of steroid inhaler.  We will see her on the 20th if this gets dramatically worse may need Diflucan-please call if that is the case-otherwise we will see her at her follow-up

## 2017-11-14 NOTE — Telephone Encounter (Signed)
Pt returned call. States she is still having white spot on the back of her tongue and in her mouth; states that it is painful. She has been rinsing with apple cider vinegar and that had help to clear it up but she started using her inhaler and the white spots came back. She is unsure of what to take for, she does have an upcoming appt on Wednesday November 21 2017. She also needed refills on Topamax.

## 2017-11-14 NOTE — Telephone Encounter (Signed)
Spoke with patient and sent med [facility] to [facility]

## 2017-11-14 NOTE — Progress Notes (Unsigned)
nys 

## 2017-11-21 ENCOUNTER — Encounter: Payer: Self-pay | Admitting: Family Medicine

## 2017-11-21 ENCOUNTER — Ambulatory Visit (HOSPITAL_COMMUNITY)
Admission: RE | Admit: 2017-11-21 | Discharge: 2017-11-21 | Disposition: A | Discharge status: Home / Self Care | Payer: 59 | Source: Ambulatory Visit | Attending: Family Medicine | Admitting: Family Medicine

## 2017-11-21 ENCOUNTER — Ambulatory Visit: Payer: 59 | Admitting: Family Medicine

## 2017-11-21 VITALS — BP 126/76 | Ht 61.5 in | Wt 194.2 lb

## 2017-11-21 DIAGNOSIS — R5383 Other fatigue: Secondary | ICD-10-CM

## 2017-11-21 DIAGNOSIS — M542 Cervicalgia: Secondary | ICD-10-CM | POA: Diagnosis not present

## 2017-11-21 DIAGNOSIS — R600 Localized edema: Secondary | ICD-10-CM

## 2017-11-21 DIAGNOSIS — J4520 Mild intermittent asthma, uncomplicated: Secondary | ICD-10-CM | POA: Diagnosis not present

## 2017-11-21 DIAGNOSIS — R1013 Epigastric pain: Secondary | ICD-10-CM | POA: Diagnosis not present

## 2017-11-21 DIAGNOSIS — R0789 Other chest pain: Secondary | ICD-10-CM | POA: Diagnosis not present

## 2017-11-21 DIAGNOSIS — K219 Gastro-esophageal reflux disease without esophagitis: Secondary | ICD-10-CM

## 2017-11-21 DIAGNOSIS — G43009 Migraine without aura, not intractable, without status migrainosus: Secondary | ICD-10-CM

## 2017-11-21 MED ORDER — BUSPIRONE HCL 15 MG PO TABS: ORAL_TABLET | ORAL | 6 refills | Status: DC

## 2017-11-21 MED ORDER — CITALOPRAM HYDROBROMIDE 20 MG PO TABS: 20.0000 mg | ORAL_TABLET | Freq: Every day | ORAL | 5 refills | Status: DC

## 2017-11-21 MED ORDER — ALPRAZOLAM 0.25 MG PO TABS: ORAL_TABLET | ORAL | 5 refills | Status: DC

## 2017-11-21 MED ORDER — HYDROCHLOROTHIAZIDE 12.5 MG PO CAPS: ORAL_CAPSULE | ORAL | 0 refills | Status: DC

## 2017-11-21 MED ORDER — MOMETASONE FUROATE 0.1 % EX CREA: TOPICAL_CREAM | CUTANEOUS | 4 refills | Status: DC

## 2017-11-21 MED ORDER — PANTOPRAZOLE SODIUM 40 MG PO TBEC: 40.0000 mg | DELAYED_RELEASE_TABLET | Freq: Two times a day (BID) | ORAL | 5 refills | Status: DC

## 2017-11-21 MED ORDER — POTASSIUM CHLORIDE ER 10 MEQ PO TBCR: EXTENDED_RELEASE_TABLET | ORAL | 5 refills | Status: DC

## 2017-11-21 NOTE — Progress Notes (Signed)
Subjective:    Patient ID: Alicia Owens, female    DOB: 03/16/1976, 42 y.o.   MRN: [id number]  HPI Patient here today for 6 month followup on migraines, edema, IBS, and GERD.  1. Pedal edema Patient relates feeling swollen and puffy in her hands her ankles and her lower legs she states it is worse later in the day but she wakes up and has some problems with it she does have some obesity issues she does not use furosemide on a regular basis because she states it makes her feel bad she denies excessive salt she does not exercise as much as she would like this is been going on for months  2. Chest tightness She relates that she has been having some intermittent chest tightness it does not always occur with activity she does not do any type of strenuous activity she does do some walking at times she experiences lower chest tightness she denies any wheezing difficulty breathing she denies cough hemoptysis weight loss denies anginal-like symptoms been going on for weeks only occurs couple times per week lasts only for 30-60 seconds - DG Chest 2 View  3. Mild intermittent asthma without complication Patient with intermittent asthma issues but no longer using a steroid inhaler she states she uses albuterol on a as needed basis but does not use it frequently.  She has had asthma for years is been stable recently  4. Migraine without aura and without status migrainosus, not intractable She takes her migraine medicine on a regular basis has not had any flareups of it tolerating the medicine well not having any side effects with it no serious migraines recently  5. Gastroesophageal reflux disease without esophagitis Has intermittent reflux issues with regurgitation she takes her acid blocker a couple times per day in addition to this experiences epigastric pain and lower chest burning associated with reflux that happens possibly twice a week takes antacids seems to help her with this this been going on  for months seems to be getting a little bit worse  6. Epigastric pain Denies dysphagia denies vomiting denies hematochezia or hematemesis.  No weight loss sweats or chills please see description above  7. Cervical spine pain Patient has right-sided neck pain discomfort does not radiate down the arms been going on for months takes Advil and Aleve as needed denies any difficulty turning the neck denies any difficulty swallowing it is in the posterior right region.  No numbness or tingling into the arm - DG Cervical Spine Complete; Future  8. Other fatigue She relates moderate fatigue tiredness denies being depressed states her depression is under good control uses Xanax just infrequently denies any other underlying issues - TSH - Lipid panel - Hepatic function panel - Lipase - CBC with Differential   Review of Systems  Constitutional: Negative for activity change, appetite change and fatigue.  HENT: Negative for congestion.   Respiratory: Negative for cough.   Cardiovascular: Negative for chest pain.  Gastrointestinal: Negative for abdominal pain.  Endocrine: Negative for polydipsia and polyphagia.  Skin: Negative for color change.  Neurological: Negative for weakness.  Psychiatric/Behavioral: Negative for confusion.       Objective:   Physical Exam  Constitutional: She appears well-developed and well-nourished. No distress.  HENT:  Head: Normocephalic and atraumatic.  Eyes: Right eye exhibits no discharge. Left eye exhibits no discharge.  Neck: No tracheal deviation present.  Cardiovascular: Normal rate, regular rhythm and normal heart sounds.  No murmur heard. Pulmonary/Chest: Effort  normal and breath sounds normal. No respiratory distress. She has no wheezes. She has no rales.  Musculoskeletal: She exhibits no edema.  Lymphadenopathy:    She has no cervical adenopathy.  Neurological: She is alert. She exhibits normal muscle tone.  Skin: Skin is warm and dry. No erythema.   Psychiatric: Her behavior is normal.  Vitals reviewed.         Assessment & Plan:  1. Pedal edema There is minimal pedal edema I recommend we try diuretic HCTZ 12.5 mg 1 every morning as needed starting off I recommend that she takes it Monday Wednesday Friday she will give [place] feedback how well this is working  2. Chest tightness I do not feel that this is angina.  If she starts to have more serious trouble we will need cardiology consultation EKG looks normal This EKG was compared to the most recent previous EKG available in the electronic records.  (Please see previous EKGs most recent EKG under the EKG heading.) There is no acute changes noted-EKG 2012 no changes - DG Chest 2 View  3. Mild intermittent asthma without complication Asthma under good control I recommend that she start using her steroid inhaler on a regular basis because of allergy issues.  Pollen season  4. Migraine without aura and without status migrainosus, not intractable Migraines under good control with current medication continue current medication follow-up if ongoing troubles  5. Gastroesophageal reflux disease without esophagitis Reflux not under better control switch to Protonix 40 mg twice daily see how this does if she has progressive troubles may need referral for EGD  6. Epigastric pain Please see description above  7. Cervical spine pain I recommend x-rays to rule out the possibility of arthritic changes I doubt herniated disc I do not feel the patient needs any type of surgery - DG Cervical Spine Complete; Future  8. Other fatigue Significant fatigue tiredness lab work ordered await the results - TSH - Lipid panel - Hepatic function panel - Lipase - CBC with Differential

## 2017-11-21 NOTE — Patient Instructions (Signed)
DASH Eating Plan DASH stands for "Dietary Approaches to Stop Hypertension." The DASH eating plan is a healthy eating plan that has been shown to reduce high blood pressure (hypertension). It may also reduce your risk for type 2 diabetes, heart disease, and stroke. The DASH eating plan may also help with weight loss. What are tips for following this plan? General guidelines  Avoid eating more than 2,300 mg (milligrams) of salt (sodium) a day. If you have hypertension, you may need to reduce your sodium intake to 1,500 mg a day.  Limit alcohol intake to no more than 1 drink a day for nonpregnant women and 2 drinks a day for men. One drink equals 12 oz of beer, 5 oz of wine, or 1 oz of hard liquor.  Work with your health care provider to maintain a healthy body weight or to lose weight. Ask what an ideal weight is for you.  Get at least 30 minutes of exercise that causes your heart to beat faster (aerobic exercise) most days of the week. Activities may include walking, swimming, or biking.  Work with your health care provider or diet and nutrition specialist (dietitian) to adjust your eating plan to your individual calorie needs. Reading food labels  Check food labels for the amount of sodium per serving. Choose foods with less than 5 percent of the Daily Value of sodium. Generally, foods with less than 300 mg of sodium per serving fit into this eating plan.  To find whole grains, look for the word "whole" as the first word in the ingredient list. Shopping  Buy products labeled as "low-sodium" or "no salt added."  Buy fresh foods. Avoid canned foods and premade or frozen meals. Cooking  Avoid adding salt when cooking. Use salt-free seasonings or herbs instead of table salt or sea salt. Check with your health care provider or pharmacist before using salt substitutes.  Do not fry foods. Cook foods using healthy methods such as baking, boiling, grilling, and broiling instead.  Cook with  heart-healthy oils, such as olive, canola, soybean, or sunflower oil. Meal planning   Eat a balanced diet that includes: ? 5 or more servings of fruits and vegetables each day. At each meal, try to fill half of your plate with fruits and vegetables. ? Up to 6-8 servings of whole grains each day. ? Less than 6 oz of lean meat, poultry, or fish each day. A 3-oz serving of meat is about the same size as a deck of cards. One egg equals 1 oz. ? 2 servings of low-fat dairy each day. ? A serving of nuts, seeds, or beans 5 times each week. ? Heart-healthy fats. Healthy fats called Omega-3 fatty acids are found in foods such as flaxseeds and coldwater fish, like sardines, salmon, and mackerel.  Limit how much you eat of the following: ? Canned or prepackaged foods. ? Food that is high in trans fat, such as fried foods. ? Food that is high in saturated fat, such as fatty meat. ? Sweets, desserts, sugary drinks, and other foods with added sugar. ? Full-fat dairy products.  Do not salt foods before eating.  Try to eat at least 2 vegetarian meals each week.  Eat more home-cooked food and less restaurant, buffet, and fast food.  When eating at a restaurant, ask that your food be prepared with less salt or no salt, if possible. What foods are recommended? The items listed may not be a complete list. Talk with your dietitian about what   dietary choices are best for you. Grains Whole-grain or whole-wheat bread. Whole-grain or whole-wheat pasta. Brown rice. Oatmeal. Quinoa. Bulgur. Whole-grain and low-sodium cereals. Pita bread. Low-fat, low-sodium crackers. Whole-wheat flour tortillas. Vegetables Fresh or frozen vegetables (raw, steamed, roasted, or grilled). Low-sodium or reduced-sodium tomato and vegetable juice. Low-sodium or reduced-sodium tomato sauce and tomato paste. Low-sodium or reduced-sodium canned vegetables. Fruits All fresh, dried, or frozen fruit. Canned fruit in natural juice (without  added sugar). Meat and other protein foods Skinless chicken or turkey. Ground chicken or turkey. Pork with fat trimmed off. Fish and seafood. Egg whites. Dried beans, peas, or lentils. Unsalted nuts, nut butters, and seeds. Unsalted canned beans. Lean cuts of beef with fat trimmed off. Low-sodium, lean deli meat. Dairy Low-fat (1%) or fat-free (skim) milk. Fat-free, low-fat, or reduced-fat cheeses. Nonfat, low-sodium ricotta or cottage cheese. Low-fat or nonfat yogurt. Low-fat, low-sodium cheese. Fats and oils Soft margarine without trans fats. Vegetable oil. Low-fat, reduced-fat, or light mayonnaise and salad dressings (reduced-sodium). Canola, safflower, olive, soybean, and sunflower oils. Avocado. Seasoning and other foods Herbs. Spices. Seasoning mixes without salt. Unsalted popcorn and pretzels. Fat-free sweets. What foods are not recommended? The items listed may not be a complete list. Talk with your dietitian about what dietary choices are best for you. Grains Baked goods made with fat, such as croissants, muffins, or some breads. Dry pasta or rice meal packs. Vegetables Creamed or fried vegetables. Vegetables in a cheese sauce. Regular canned vegetables (not low-sodium or reduced-sodium). Regular canned tomato sauce and paste (not low-sodium or reduced-sodium). Regular tomato and vegetable juice (not low-sodium or reduced-sodium). Pickles. Olives. Fruits Canned fruit in a light or heavy syrup. Fried fruit. Fruit in cream or butter sauce. Meat and other protein foods Fatty cuts of meat. Ribs. Fried meat. Bacon. Sausage. Bologna and other processed lunch meats. Salami. Fatback. Hotdogs. Bratwurst. Salted nuts and seeds. Canned beans with added salt. Canned or smoked fish. Whole eggs or egg yolks. Chicken or turkey with skin. Dairy Whole or 2% milk, cream, and half-and-half. Whole or full-fat cream cheese. Whole-fat or sweetened yogurt. Full-fat cheese. Nondairy creamers. Whipped toppings.  Processed cheese and cheese spreads. Fats and oils Butter. Stick margarine. Lard. Shortening. Ghee. Bacon fat. Tropical oils, such as coconut, palm kernel, or palm oil. Seasoning and other foods Salted popcorn and pretzels. Onion salt, garlic salt, seasoned salt, table salt, and sea salt. Worcestershire sauce. Tartar sauce. Barbecue sauce. Teriyaki sauce. Soy sauce, including reduced-sodium. Steak sauce. Canned and packaged gravies. Fish sauce. Oyster sauce. Cocktail sauce. Horseradish that you find on the shelf. Ketchup. Mustard. Meat flavorings and tenderizers. Bouillon cubes. Hot sauce and Tabasco sauce. Premade or packaged marinades. Premade or packaged taco seasonings. Relishes. Regular salad dressings. Where to find more information:  National Heart, Lung, and Blood Institute: www.nhlbi.nih.gov  American Heart Association: www.heart.org Summary  The DASH eating plan is a healthy eating plan that has been shown to reduce high blood pressure (hypertension). It may also reduce your risk for type 2 diabetes, heart disease, and stroke.  With the DASH eating plan, you should limit salt (sodium) intake to 2,300 mg a day. If you have hypertension, you may need to reduce your sodium intake to 1,500 mg a day.  When on the DASH eating plan, aim to eat more fresh fruits and vegetables, whole grains, lean proteins, low-fat dairy, and heart-healthy fats.  Work with your health care provider or diet and nutrition specialist (dietitian) to adjust your eating plan to your individual   calorie needs. This information is not intended to replace advice given to you by your health care provider. Make sure you discuss any questions you have with your health care provider. Document Released: 08/10/2011 Document Revised: 08/14/2016 Document Reviewed: 08/14/2016 Elsevier Interactive Patient Education  2018 [employer]  

## 2017-11-21 NOTE — Addendum Note (Signed)
Addended by: Marlowe ShoresMANLEY, Mathea Frieling on: 11/21/2017 01:38 PM   Modules accepted: Orders

## 2017-11-22 ENCOUNTER — Encounter: Payer: Self-pay | Admitting: Family Medicine

## 2017-11-22 LAB — HEPATIC FUNCTION PANEL
ALK PHOS: 79 IU/L (ref 39–117)
ALT: 26 IU/L (ref 0–32)
AST: 21 IU/L (ref 0–40)
Albumin: 4.2 g/dL (ref 3.5–5.5)
BILIRUBIN TOTAL: 0.2 mg/dL (ref 0.0–1.2)
BILIRUBIN, DIRECT: 0.08 mg/dL (ref 0.00–0.40)
Total Protein: 7.3 g/dL (ref 6.0–8.5)

## 2017-11-22 LAB — CBC WITH DIFFERENTIAL/PLATELET
Basophils Absolute: 0 10*3/uL (ref 0.0–0.2)
Basos: 0 %
EOS (ABSOLUTE): 0.4 10*3/uL (ref 0.0–0.4)
Eos: 4 %
Hematocrit: 39.6 % (ref 34.0–46.6)
Hemoglobin: 13.1 g/dL (ref 11.1–15.9)
IMMATURE GRANS (ABS): 0 10*3/uL (ref 0.0–0.1)
Immature Granulocytes: 0 %
LYMPHS ABS: 2.6 10*3/uL (ref 0.7–3.1)
LYMPHS: 27 %
MCH: 29.6 pg (ref 26.6–33.0)
MCHC: 33.1 g/dL (ref 31.5–35.7)
MCV: 90 fL (ref 79–97)
MONOS ABS: 0.6 10*3/uL (ref 0.1–0.9)
Monocytes: 6 %
NEUTROS ABS: 6 10*3/uL (ref 1.4–7.0)
Neutrophils: 63 %
PLATELETS: 352 10*3/uL (ref 150–379)
RBC: 4.42 x10E6/uL (ref 3.77–5.28)
RDW: 13.6 % (ref 12.3–15.4)
WBC: 9.7 10*3/uL (ref 3.4–10.8)

## 2017-11-22 LAB — LIPID PANEL
CHOL/HDL RATIO: 4.9 ratio — AB (ref 0.0–4.4)
Cholesterol, Total: 190 mg/dL (ref 100–199)
HDL: 39 mg/dL — ABNORMAL LOW (ref >39)
LDL CALC: 113 mg/dL — AB (ref 0–99)
Triglycerides: 192 mg/dL — ABNORMAL HIGH (ref 0–149)
VLDL Cholesterol Cal: 38 mg/dL (ref 5–40)

## 2017-11-22 LAB — LIPASE: LIPASE: 39 U/L (ref 14–72)

## 2017-11-22 LAB — TSH: TSH: 0.884 u[IU]/mL (ref 0.450–4.500)

## 2017-11-23 ENCOUNTER — Other Ambulatory Visit: Payer: Self-pay | Admitting: Family Medicine

## 2017-11-23 DIAGNOSIS — M542 Cervicalgia: Secondary | ICD-10-CM

## 2017-11-23 NOTE — Progress Notes (Unsigned)
amb re 

## 2017-11-27 ENCOUNTER — Encounter: Payer: Self-pay | Admitting: Family Medicine

## 2017-12-10 ENCOUNTER — Telehealth: Payer: Self-pay | Admitting: Family Medicine

## 2017-12-10 NOTE — Telephone Encounter (Signed)
Spoke with Rene KocherRegina - she will see if Dr. Franky Machoabbell will see pt without scan & will let me know

## 2017-12-10 NOTE — Telephone Encounter (Signed)
[facility]  Not clear why we referred pt  Note clearly states that Dr. Lorin PicketScott doesn't feel pt has a surgical issue   No MRI has been done or ordered   They prefer to have MRI/CT results to review to decide if pt needs to see a surgeon for consult or pain management for injection  Please advise

## 2017-12-10 NOTE — Telephone Encounter (Signed)
Patient requested to have consult with specialist and let them decide if she needed MRI

## 2018-01-29 ENCOUNTER — Ambulatory Visit: Payer: 59 | Admitting: Family Medicine

## 2018-01-29 ENCOUNTER — Encounter: Payer: Self-pay | Admitting: Family Medicine

## 2018-01-29 VITALS — Temp 98.2°F | Ht 61.5 in | Wt 191.0 lb

## 2018-01-29 DIAGNOSIS — I10 Essential (primary) hypertension: Secondary | ICD-10-CM

## 2018-01-29 DIAGNOSIS — R51 Headache: Secondary | ICD-10-CM | POA: Diagnosis not present

## 2018-01-29 DIAGNOSIS — N912 Amenorrhea, unspecified: Secondary | ICD-10-CM

## 2018-01-29 DIAGNOSIS — R519 Headache, unspecified: Secondary | ICD-10-CM

## 2018-01-29 MED ORDER — HYDROCHLOROTHIAZIDE 25 MG PO TABS: 25.0000 mg | ORAL_TABLET | Freq: Every day | ORAL | 4 refills | Status: DC

## 2018-01-29 NOTE — Progress Notes (Signed)
   Subjective:    Patient ID: Alicia Owens, female    DOB: 1976/04/12, 42 y.o.   MRN: [id number]  HPI Patient is here today to follow up on blood pressure issues for around three weeks ago.She states she is tired and not feeling well has a headache that will not go away.Also states she has some sinus pressure also. She is not taking any medications for htn. Has a hx of preeclampsia.   Patient recently has noted that her blood pressure is been elevated she has a mild headache with it no blurred vision no vomiting.  Energy level overall doing fairly well but does not feel up to her usual self Review of Systems  Constitutional: Negative for activity change, fatigue and fever.  HENT: Negative for congestion.   Respiratory: Negative for cough, chest tightness and shortness of breath.   Cardiovascular: Negative for chest pain and leg swelling.  Gastrointestinal: Negative for abdominal pain.  Skin: Negative for color change.  Neurological: Negative for headaches.  Psychiatric/Behavioral: Negative for behavioral problems.       Objective:   Physical Exam  Constitutional: She appears well-nourished. No distress.  Cardiovascular: Normal rate, regular rhythm and normal heart sounds.  No murmur heard. Pulmonary/Chest: Effort normal and breath sounds normal. No respiratory distress.  Musculoskeletal: She exhibits no edema.  Lymphadenopathy:    She has no cervical adenopathy.  Neurological: She is alert. She exhibits normal muscle tone.  Psychiatric: Her behavior is normal.  Vitals reviewed.   The patient states she will also be seeing her gynecologist Dr. Otto Herb we can send a copy of all lab work to them      Assessment & Plan:  HTN Dietary activity discussed HCTZ 25 mg every morning Potassium 10 mEq every morning Courtesy blood pressure check next week   Amenorrhea will be doing some lab tests-it should also be noted that the patient is having mild headache.  Therefore will  need to check prolactin as well as other testing  Following test will be ordered testosterone level, estradiol, serum hCG, FSH, TSH, prolactin

## 2018-01-29 NOTE — Patient Instructions (Signed)
DASH Eating Plan DASH stands for "Dietary Approaches to Stop Hypertension." The DASH eating plan is a healthy eating plan that has been shown to reduce high blood pressure (hypertension). It may also reduce your risk for type 2 diabetes, heart disease, and stroke. The DASH eating plan may also help with weight loss. What are tips for following this plan? General guidelines  Avoid eating more than 2,300 mg (milligrams) of salt (sodium) a day. If you have hypertension, you may need to reduce your sodium intake to 1,500 mg a day.  Limit alcohol intake to no more than 1 drink a day for nonpregnant women and 2 drinks a day for men. One drink equals 12 oz of beer, 5 oz of wine, or 1 oz of hard liquor.  Work with your health care provider to maintain a healthy body weight or to lose weight. Ask what an ideal weight is for you.  Get at least 30 minutes of exercise that causes your heart to beat faster (aerobic exercise) most days of the week. Activities may include walking, swimming, or biking.  Work with your health care provider or diet and nutrition specialist (dietitian) to adjust your eating plan to your individual calorie needs. Reading food labels  Check food labels for the amount of sodium per serving. Choose foods with less than 5 percent of the Daily Value of sodium. Generally, foods with less than 300 mg of sodium per serving fit into this eating plan.  To find whole grains, look for the word "whole" as the first word in the ingredient list. Shopping  Buy products labeled as "low-sodium" or "no salt added."  Buy fresh foods. Avoid canned foods and premade or frozen meals. Cooking  Avoid adding salt when cooking. Use salt-free seasonings or herbs instead of table salt or sea salt. Check with your health care provider or pharmacist before using salt substitutes.  Do not fry foods. Cook foods using healthy methods such as baking, boiling, grilling, and broiling instead.  Cook with  heart-healthy oils, such as olive, canola, soybean, or sunflower oil. Meal planning   Eat a balanced diet that includes: ? 5 or more servings of fruits and vegetables each day. At each meal, try to fill half of your plate with fruits and vegetables. ? Up to 6-8 servings of whole grains each day. ? Less than 6 oz of lean meat, poultry, or fish each day. A 3-oz serving of meat is about the same size as a deck of cards. One egg equals 1 oz. ? 2 servings of low-fat dairy each day. ? A serving of nuts, seeds, or beans 5 times each week. ? Heart-healthy fats. Healthy fats called Omega-3 fatty acids are found in foods such as flaxseeds and coldwater fish, like sardines, salmon, and mackerel.  Limit how much you eat of the following: ? Canned or prepackaged foods. ? Food that is high in trans fat, such as fried foods. ? Food that is high in saturated fat, such as fatty meat. ? Sweets, desserts, sugary drinks, and other foods with added sugar. ? Full-fat dairy products.  Do not salt foods before eating.  Try to eat at least 2 vegetarian meals each week.  Eat more home-cooked food and less restaurant, buffet, and fast food.  When eating at a restaurant, ask that your food be prepared with less salt or no salt, if possible. What foods are recommended? The items listed may not be a complete list. Talk with your dietitian about what   dietary choices are best for you. Grains Whole-grain or whole-wheat bread. Whole-grain or whole-wheat pasta. Brown rice. Oatmeal. Quinoa. Bulgur. Whole-grain and low-sodium cereals. Pita bread. Low-fat, low-sodium crackers. Whole-wheat flour tortillas. Vegetables Fresh or frozen vegetables (raw, steamed, roasted, or grilled). Low-sodium or reduced-sodium tomato and vegetable juice. Low-sodium or reduced-sodium tomato sauce and tomato paste. Low-sodium or reduced-sodium canned vegetables. Fruits All fresh, dried, or frozen fruit. Canned fruit in natural juice (without  added sugar). Meat and other protein foods Skinless chicken or turkey. Ground chicken or turkey. Pork with fat trimmed off. Fish and seafood. Egg whites. Dried beans, peas, or lentils. Unsalted nuts, nut butters, and seeds. Unsalted canned beans. Lean cuts of beef with fat trimmed off. Low-sodium, lean deli meat. Dairy Low-fat (1%) or fat-free (skim) milk. Fat-free, low-fat, or reduced-fat cheeses. Nonfat, low-sodium ricotta or cottage cheese. Low-fat or nonfat yogurt. Low-fat, low-sodium cheese. Fats and oils Soft margarine without trans fats. Vegetable oil. Low-fat, reduced-fat, or light mayonnaise and salad dressings (reduced-sodium). Canola, safflower, olive, soybean, and sunflower oils. Avocado. Seasoning and other foods Herbs. Spices. Seasoning mixes without salt. Unsalted popcorn and pretzels. Fat-free sweets. What foods are not recommended? The items listed may not be a complete list. Talk with your dietitian about what dietary choices are best for you. Grains Baked goods made with fat, such as croissants, muffins, or some breads. Dry pasta or rice meal packs. Vegetables Creamed or fried vegetables. Vegetables in a cheese sauce. Regular canned vegetables (not low-sodium or reduced-sodium). Regular canned tomato sauce and paste (not low-sodium or reduced-sodium). Regular tomato and vegetable juice (not low-sodium or reduced-sodium). Pickles. Olives. Fruits Canned fruit in a light or heavy syrup. Fried fruit. Fruit in cream or butter sauce. Meat and other protein foods Fatty cuts of meat. Ribs. Fried meat. Bacon. Sausage. Bologna and other processed lunch meats. Salami. Fatback. Hotdogs. Bratwurst. Salted nuts and seeds. Canned beans with added salt. Canned or smoked fish. Whole eggs or egg yolks. Chicken or turkey with skin. Dairy Whole or 2% milk, cream, and half-and-half. Whole or full-fat cream cheese. Whole-fat or sweetened yogurt. Full-fat cheese. Nondairy creamers. Whipped toppings.  Processed cheese and cheese spreads. Fats and oils Butter. Stick margarine. Lard. Shortening. Ghee. Bacon fat. Tropical oils, such as coconut, palm kernel, or palm oil. Seasoning and other foods Salted popcorn and pretzels. Onion salt, garlic salt, seasoned salt, table salt, and sea salt. Worcestershire sauce. Tartar sauce. Barbecue sauce. Teriyaki sauce. Soy sauce, including reduced-sodium. Steak sauce. Canned and packaged gravies. Fish sauce. Oyster sauce. Cocktail sauce. Horseradish that you find on the shelf. Ketchup. Mustard. Meat flavorings and tenderizers. Bouillon cubes. Hot sauce and Tabasco sauce. Premade or packaged marinades. Premade or packaged taco seasonings. Relishes. Regular salad dressings. Where to find more information:  National Heart, Lung, and Blood Institute: www.nhlbi.nih.gov  American Heart Association: www.heart.org Summary  The DASH eating plan is a healthy eating plan that has been shown to reduce high blood pressure (hypertension). It may also reduce your risk for type 2 diabetes, heart disease, and stroke.  With the DASH eating plan, you should limit salt (sodium) intake to 2,300 mg a day. If you have hypertension, you may need to reduce your sodium intake to 1,500 mg a day.  When on the DASH eating plan, aim to eat more fresh fruits and vegetables, whole grains, lean proteins, low-fat dairy, and heart-healthy fats.  Work with your health care provider or diet and nutrition specialist (dietitian) to adjust your eating plan to your individual   calorie needs. This information is not intended to replace advice given to you by your health care provider. Make sure you discuss any questions you have with your health care provider. Document Released: 08/10/2011 Document Revised: 08/14/2016 Document Reviewed: 08/14/2016 Elsevier Interactive Patient Education  2018 [employer]  

## 2018-01-30 NOTE — Addendum Note (Signed)
Addended by: Margaretha Sheffield on: 01/30/2018 08:21 AM   Modules accepted: Orders

## 2018-01-30 NOTE — Progress Notes (Signed)
Blood work ordered in Epic. Patient notified and verbalized understanding. ?

## 2018-02-01 LAB — FSH/LH
FSH: 10.1 m[IU]/mL
LH: 5.1 m[IU]/mL

## 2018-02-01 LAB — TSH: TSH: 0.863 u[IU]/mL (ref 0.450–4.500)

## 2018-02-01 LAB — ESTRADIOL: ESTRADIOL: 25.7 pg/mL

## 2018-02-01 LAB — HCG, SERUM, QUALITATIVE: hCG,Beta Subunit,Qual,Serum: NEGATIVE m[IU]/mL (ref <6)

## 2018-02-01 LAB — TESTOSTERONE: Testosterone: 18 ng/dL (ref 8–48)

## 2018-02-01 LAB — PROLACTIN: PROLACTIN: 11.1 ng/mL (ref 4.8–23.3)

## 2018-02-06 ENCOUNTER — Ambulatory Visit: Payer: 59

## 2018-02-06 VITALS — BP 152/86 | Ht 61.5 in | Wt 187.0 lb

## 2018-02-06 DIAGNOSIS — I10 Essential (primary) hypertension: Secondary | ICD-10-CM

## 2018-02-06 NOTE — Progress Notes (Signed)
   Subjective:    Patient ID: Alicia Owens, female    DOB: 10/30/1975, 42 y.o.   MRN: [id number]  HPI Pt here for BP check per provider. Pt states she is still having headache. Pt states she takes her BP in the evening after she gets home and sits down for 30-45 minutes; takes BP in left arm .   Review of Systems     Objective:   Physical Exam        Assessment & Plan:

## 2018-02-09 ENCOUNTER — Other Ambulatory Visit: Payer: Self-pay | Admitting: Family Medicine

## 2018-02-10 ENCOUNTER — Encounter (HOSPITAL_COMMUNITY): Payer: Self-pay

## 2018-02-10 ENCOUNTER — Other Ambulatory Visit: Payer: Self-pay

## 2018-02-10 ENCOUNTER — Emergency Department (HOSPITAL_COMMUNITY): Payer: 59

## 2018-02-10 ENCOUNTER — Emergency Department (HOSPITAL_COMMUNITY)
Admission: EM | Admit: 2018-02-10 | Discharge: 2018-02-10 | Disposition: A | Discharge status: Home / Self Care | Payer: 59 | Attending: Emergency Medicine | Admitting: Emergency Medicine

## 2018-02-10 DIAGNOSIS — Z79899 Other long term (current) drug therapy: Secondary | ICD-10-CM | POA: Insufficient documentation

## 2018-02-10 DIAGNOSIS — I10 Essential (primary) hypertension: Secondary | ICD-10-CM | POA: Insufficient documentation

## 2018-02-10 DIAGNOSIS — R1032 Left lower quadrant pain: Secondary | ICD-10-CM | POA: Diagnosis present

## 2018-02-10 DIAGNOSIS — K5732 Diverticulitis of large intestine without perforation or abscess without bleeding: Secondary | ICD-10-CM | POA: Insufficient documentation

## 2018-02-10 DIAGNOSIS — K5792 Diverticulitis of intestine, part unspecified, without perforation or abscess without bleeding: Secondary | ICD-10-CM

## 2018-02-10 LAB — URINALYSIS, ROUTINE W REFLEX MICROSCOPIC
BILIRUBIN URINE: NEGATIVE
Glucose, UA: NEGATIVE mg/dL
HGB URINE DIPSTICK: NEGATIVE
Ketones, ur: NEGATIVE mg/dL
NITRITE: NEGATIVE
PH: 8 (ref 5.0–8.0)
Protein, ur: 30 mg/dL — AB
SPECIFIC GRAVITY, URINE: 1.02 (ref 1.005–1.030)

## 2018-02-10 LAB — COMPREHENSIVE METABOLIC PANEL
ALK PHOS: 72 U/L (ref 38–126)
ALT: 30 U/L (ref 14–54)
ANION GAP: 14 (ref 5–15)
AST: 28 U/L (ref 15–41)
Albumin: 4.3 g/dL (ref 3.5–5.0)
BUN: 9 mg/dL (ref 6–20)
CALCIUM: 9.3 mg/dL (ref 8.9–10.3)
CO2: 30 mmol/L (ref 22–32)
Chloride: 93 mmol/L — ABNORMAL LOW (ref 101–111)
Creatinine, Ser: 1.04 mg/dL — ABNORMAL HIGH (ref 0.44–1.00)
GFR calc Af Amer: >60 mL/min (ref >60)
GFR calc non Af Amer: >60 mL/min (ref >60)
GLUCOSE: 141 mg/dL — AB (ref 65–99)
Potassium: 3.2 mmol/L — ABNORMAL LOW (ref 3.5–5.1)
SODIUM: 137 mmol/L (ref 135–145)
Total Bilirubin: 1.1 mg/dL (ref 0.3–1.2)
Total Protein: 8.4 g/dL — ABNORMAL HIGH (ref 6.5–8.1)

## 2018-02-10 LAB — CBC WITH DIFFERENTIAL/PLATELET
BASOS ABS: 0 10*3/uL (ref 0.0–0.1)
Basophils Relative: 0 %
EOS ABS: 0.2 10*3/uL (ref 0.0–0.7)
Eosinophils Relative: 1 %
HCT: 41.2 % (ref 36.0–46.0)
HEMOGLOBIN: 13.7 g/dL (ref 12.0–15.0)
Lymphocytes Relative: 12 %
Lymphs Abs: 2.6 10*3/uL (ref 0.7–4.0)
MCH: 29.7 pg (ref 26.0–34.0)
MCHC: 33.3 g/dL (ref 30.0–36.0)
MCV: 89.2 fL (ref 78.0–100.0)
Monocytes Absolute: 1.7 10*3/uL — ABNORMAL HIGH (ref 0.1–1.0)
Monocytes Relative: 8 %
NEUTROS ABS: 17.2 10*3/uL — AB (ref 1.7–7.7)
NEUTROS PCT: 79 %
Platelets: 423 10*3/uL — ABNORMAL HIGH (ref 150–400)
RBC: 4.62 MIL/uL (ref 3.87–5.11)
RDW: 12.7 % (ref 11.5–15.5)
WBC: 21.7 10*3/uL — ABNORMAL HIGH (ref 4.0–10.5)

## 2018-02-10 LAB — LIPASE, BLOOD: Lipase: 64 U/L — ABNORMAL HIGH (ref 11–51)

## 2018-02-10 LAB — PREGNANCY, URINE: Preg Test, Ur: NEGATIVE

## 2018-02-10 MED ORDER — OXYCODONE-ACETAMINOPHEN 5-325 MG PO TABS: 1.0000 | ORAL_TABLET | ORAL | 0 refills | Status: DC | PRN

## 2018-02-10 MED ORDER — SODIUM CHLORIDE 0.9 % IV BOLUS
1000.0000 mL | Freq: Once | INTRAVENOUS | Status: AC
  Administered 2018-02-10: 1000 mL via INTRAVENOUS

## 2018-02-10 MED ORDER — METRONIDAZOLE IN NACL 5-0.79 MG/ML-% IV SOLN
500.0000 mg | Freq: Once | INTRAVENOUS | Status: AC
  Administered 2018-02-10: 500 mg via INTRAVENOUS
  Filled 2018-02-10: qty 100

## 2018-02-10 MED ORDER — IOPAMIDOL (ISOVUE-300) INJECTION 61%
100.0000 mL | Freq: Once | INTRAVENOUS | Status: AC | PRN
  Administered 2018-02-10: 100 mL via INTRAVENOUS

## 2018-02-10 MED ORDER — CIPROFLOXACIN HCL 500 MG PO TABS: 500.0000 mg | ORAL_TABLET | Freq: Two times a day (BID) | ORAL | 0 refills | Status: DC

## 2018-02-10 MED ORDER — METRONIDAZOLE 500 MG PO TABS: 500.0000 mg | ORAL_TABLET | Freq: Two times a day (BID) | ORAL | 0 refills | Status: DC

## 2018-02-10 MED ORDER — MORPHINE SULFATE (PF) 4 MG/ML IV SOLN
4.0000 mg | Freq: Once | INTRAVENOUS | Status: AC
Start: 2018-02-10 — End: 2018-02-10
  Administered 2018-02-10: 4 mg via INTRAVENOUS
  Filled 2018-02-10: qty 1

## 2018-02-10 MED ORDER — SODIUM CHLORIDE 0.9 % IV BOLUS
1000.0000 mL | Freq: Once | INTRAVENOUS | Status: AC
Start: 2018-02-10 — End: 2018-02-10
  Administered 2018-02-10: 1000 mL via INTRAVENOUS

## 2018-02-10 MED ORDER — HYDROCODONE-ACETAMINOPHEN 5-325 MG PO TABS: 1.0000 | ORAL_TABLET | ORAL | 0 refills | Status: DC | PRN

## 2018-02-10 MED ORDER — CIPROFLOXACIN IN D5W 400 MG/200ML IV SOLN
400.0000 mg | Freq: Once | INTRAVENOUS | Status: AC
  Administered 2018-02-10: 400 mg via INTRAVENOUS
  Filled 2018-02-10: qty 200

## 2018-02-10 MED ORDER — ONDANSETRON HCL 4 MG/2ML IJ SOLN
4.0000 mg | Freq: Once | INTRAMUSCULAR | Status: AC
  Administered 2018-02-10: 4 mg via INTRAVENOUS
  Filled 2018-02-10: qty 2

## 2018-02-10 NOTE — ED Notes (Signed)
Pa  at bedside. 

## 2018-02-10 NOTE — ED Triage Notes (Signed)
Pt c/o lower abd pain for the past few days but worse and constant since last night.  Reports nausea, no vomiting.  Denies diarrhea.  LBM was yesterday and was normal per pt.  Denies any abnormal vaginal bleeding or discharge, denies any urinary symptoms.  LMP may 30 and was normal.

## 2018-02-10 NOTE — Discharge Instructions (Signed)
Take your next dose of each antibiotic this evening.  Do not drive within 4 hours of taking hydrocodone as this will make you drowsy.  Maintain a clear liquid diet for the next 24 hours as discussed.

## 2018-02-10 NOTE — ED Provider Notes (Signed)
[facility] EMERGENCY DEPARTMENT Provider Note   CSN: [id number] Arrival date & time: 02/10/18  4098     History   Chief Complaint Chief Complaint  Patient presents with  . Abdominal Pain    HPI Alicia Owens is a 42 y.o. 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  She describes intermittent mild pain initially, and yesterday around 3 PM she developed nausea shortly after eating her last meal after which time her pain intensified and has become constant.  There is no radiation of pain.  She denies diarrhea or constipation, her last BM was midday yesterday and was firm in character, no blood in her stool.  She has had no vomiting, also denies dysuria or hematuria, no back pain and no vaginal discharge.  She does endorse low-grade fever (reports measured at 99.5).   Her pain is constant, but worsened with movement.  She has taken Zofran at home with no relief, also tried a dicyclomine tablet which did not improve her symptoms.  The history is provided by the patient and the spouse.    Past Medical History:  Diagnosis Date  . Anemia   . Anxiety   . Chronic abdominal pain   . Depression   . Gastroparesis   . GERD (gastroesophageal reflux disease)   . Headache(784.0)   . Hypertension   . Hyperthyroidism 2011  . IBS (irritable bowel syndrome)   . PONV (postoperative nausea and vomiting)   . PP care - C/S 12/17 08/22/2011    Patient Active Problem List   Diagnosis Date Noted  . Pedal edema 11/21/2017  . Reactive airway disease 09/27/2016  . IBS (irritable bowel syndrome) 07/22/2013  . Migraines 12/17/2012  . Hypertension 10/16/2011  . Elevated liver enzymes 10/16/2011  . PP care - C/S 12/17 08/22/2011  . ANXIETY DEPRESSION 08/20/2009  . GASTRITIS, HX OF 05/20/2009  . GERD 05/03/2009  . CHEST PAIN 05/03/2009    Past Surgical  History:  Procedure Laterality Date  . APPENDECTOMY    . BASAL CELL CARCINOMA EXCISION  02/2006   face  . BREAST SURGERY     Breast reduction  . CESAREAN SECTION  2007 and 2012   x2  . CHOLECYSTECTOMY  12/15/2011   Procedure: LAPAROSCOPIC CHOLECYSTECTOMY;  Surgeon: Dalia Heading, MD;  Location: [facility];  Service: General;  Laterality: N/A;  . COLONOSCOPY    . ESOPHAGOGASTRODUODENOSCOPY    . TUBAL LIGATION     with last c-section  . WISDOM TOOTH EXTRACTION  08/2003     OB History    Gravida  2   Para  2   Term  2   Preterm      AB      Living  2     SAB      TAB      Ectopic      Multiple      Live Births  1            Home Medications    Prior to Admission medications   Medication Sig Start Date End Date Taking? Authorizing Provider  albuterol (PROVENTIL HFA;VENTOLIN HFA) 108 (90 Base) MCG/ACT inhaler Inhale 2 puffs into the lungs every 6 (six) hours as needed for wheezing or shortness of breath. 08/10/17  Yes Babs Sciara, MD  ALPRAZolam (XANAX) 0.25 MG tablet TAKE ONE TABLET TWICE DAILY  AS NEEDED 11/21/17  Yes Babs Sciara, MD  beclomethasone (QVAR REDIHALER) 80 MCG/ACT inhaler Inhale 2 puffs into the lungs 2 (two) times daily. 10/11/17  Yes Luking, Jonna Coup, MD  busPIRone (BUSPAR) 15 MG tablet TAKE ONE (1) TABLET THREE (3) TIMES EACH DAY 11/21/17  Yes Luking, Scott A, MD  citalopram (CELEXA) 20 MG tablet Take 1 tablet (20 mg total) by mouth daily. 11/21/17  Yes Luking, Jonna Coup, MD  cyclobenzaprine (FLEXERIL) 10 MG tablet TAKE ONE (1) TABLET THREE (3) TIMES EACHDAY AS NEEDED BETWEEN MEALS AND AT BEDTIME 05/15/17  Yes Luking, Scott A, MD  dicyclomine (BENTYL) 20 MG tablet TAKE ONE TABLET THREE TIMES DAILY AS NEEDED OR AS DIRECTED 05/08/17  Yes Luking, Scott A, MD  fexofenadine (ALLEGRA) 180 MG tablet Take 180 mg by mouth daily.   Yes [provider]  hydrochlorothiazide (HYDRODIURIL) 25 MG tablet Take 1 tablet (25 mg total) by mouth daily. 01/29/18  Yes  Babs Sciara, MD  ibuprofen (ADVIL,MOTRIN) 200 MG tablet Take 400 mg by mouth every 6 (six) hours as needed.    Yes [provider]  Lactobacillus-Inulin (CULTURELLE DIGESTIVE HEALTH) CAPS Take 1 capsule by mouth daily.   Yes [provider]  mometasone (ELOCON) 0.1 % cream Apply BID as needed 11/21/17  Yes Luking, Scott A, MD  mometasone (NASONEX) 50 MCG/ACT nasal spray Place 2 sprays into the nose as needed.    Yes [provider]  ondansetron (ZOFRAN) 8 MG tablet TAKE ONE TABLET THREE TO FOUR TIMES A DAY AS NEEDED 06/13/17  Yes Luking, Scott A, MD  pantoprazole (PROTONIX) 40 MG tablet Take 1 tablet (40 mg total) by mouth 2 (two) times daily. 11/21/17  Yes Babs Sciara, MD  potassium chloride (K-DUR) 10 MEQ tablet One tablet daily as needed with the HCTZ 11/21/17  Yes Luking, Scott A, MD  SUMAtriptan (IMITREX) 100 MG tablet TAKE ONE TABLET AT ONSET OF HEADACHE. MAY REPEAT ONE TIME IN TWO HOURS IF NO RELIEF. DO NOT EXCEED TWO TABLETS IN 24 HOURS. 08/27/17  Yes Babs Sciara, MD  topiramate (TOPAMAX) 50 MG tablet Take 1 tablet (50 mg total) by mouth 2 (two) times daily. Patient taking differently: Take 50 mg by mouth daily.  11/14/17  Yes Babs Sciara, MD  ciprofloxacin (CIPRO) 500 MG tablet Take 1 tablet (500 mg total) by mouth every 12 (twelve) hours. 02/10/18   Burgess Amor, PA-C  metroNIDAZOLE (FLAGYL) 500 MG tablet Take 1 tablet (500 mg total) by mouth 2 (two) times daily. 02/10/18   Burgess Amor, PA-C  Multiple Vitamins-Minerals (MULTIVITAMIN WITH MINERALS) tablet Take 1 tablet by mouth daily.    [provider]  oxyCODONE-acetaminophen (PERCOCET/ROXICET) 5-325 MG tablet Take 1 tablet by mouth every 4 (four) hours as needed. 02/10/18   Burgess Amor, PA-C  SUPER B COMPLEX/C PO Take 1 tablet by mouth daily.     [provider]  SV CRANBERRY PO Take 1 tablet by mouth daily.     [provider]  TURMERIC PO Take 1 capsule by mouth daily.      [provider]  VITAMIN D, CHOLECALCIFEROL, PO Take by mouth 3 (three) times a week.    [provider]    Family History Family History  Problem Relation Age of Onset  . Healthy Daughter   . Healthy Son   . Hypertension Mother   . Hypertension Father   . Hyperlipidemia Father   . Hyperlipidemia Other   .  Hypertension Other   . Anesthesia problems Neg Hx   . Hypotension Neg Hx   . Malignant hyperthermia Neg Hx   . Pseudochol deficiency Neg Hx     Social History Social History   Tobacco Use  . Smoking status: Never Smoker  . Smokeless tobacco: Never Used  Substance Use Topics  . Alcohol use: No  . Drug use: No     Allergies   Codeine; Promethazine hcl; Reglan [metoclopramide]; and Sulfonamide derivatives   Review of Systems Review of Systems  Constitutional: Positive for fever.  HENT: Negative for congestion and sore throat.   Eyes: Negative.   Respiratory: Negative for chest tightness and shortness of breath.   Cardiovascular: Negative for chest pain.  Gastrointestinal: Positive for abdominal pain and nausea. Negative for blood in stool, constipation, diarrhea and vomiting.  Genitourinary: Negative.  Negative for dysuria and vaginal discharge.  Musculoskeletal: Negative for arthralgias, joint swelling and neck pain.  Skin: Negative.  Negative for rash and wound.  Neurological: Negative for dizziness, weakness, light-headedness, numbness and headaches.  Psychiatric/Behavioral: Negative.      Physical Exam Updated Vital Signs BP 123/77 (BP Location: Right Arm)   Pulse 86   Temp 97.7 F (36.5 C)   Resp 16   Ht 5\' 1"  (1.549 m)   Wt 83 kg (183 lb)   LMP 01/31/2018   SpO2 100%   BMI 34.58 kg/m   Physical Exam  Constitutional: She appears well-developed and well-nourished.  HENT:  Head: Normocephalic and atraumatic.  Eyes: Conjunctivae are normal.  Neck: Normal range of motion.  Cardiovascular: Normal rate, regular rhythm, normal  heart sounds and intact distal pulses.  Pulmonary/Chest: Effort normal and breath sounds normal. She has no wheezes.  Abdominal: Soft. Bowel sounds are normal. There is no hepatosplenomegaly. There is tenderness in the left lower quadrant. There is no rigidity and no guarding.  Musculoskeletal: Normal range of motion.  Neurological: She is alert.  Skin: Skin is warm and dry.  Psychiatric: She has a normal mood and affect.  Nursing note and vitals reviewed.    ED Treatments / Results  Labs (all labs ordered are listed, but only abnormal results are displayed) Labs Reviewed  URINALYSIS, ROUTINE W REFLEX MICROSCOPIC - Abnormal; Notable for the following components:      Result Value   APPearance CLOUDY (*)    Protein, ur 30 (*)    Leukocytes, UA TRACE (*)    Bacteria, UA RARE (*)    All other components within normal limits  CBC WITH DIFFERENTIAL/PLATELET - Abnormal; Notable for the following components:   WBC 21.7 (*)    Platelets 423 (*)    Neutro Abs 17.2 (*)    Monocytes Absolute 1.7 (*)    All other components within normal limits  COMPREHENSIVE METABOLIC PANEL - Abnormal; Notable for the following components:   Potassium 3.2 (*)    Chloride 93 (*)    Glucose, Bld 141 (*)    Creatinine, Ser 1.04 (*)    Total Protein 8.4 (*)    All other components within normal limits  LIPASE, BLOOD - Abnormal; Notable for the following components:   Lipase 64 (*)    All other components within normal limits  PREGNANCY, URINE    EKG None  Radiology Ct Abdomen Pelvis W Contrast  Result Date: 02/10/2018 CLINICAL DATA:  Lower abdominal pain and nausea EXAM: CT ABDOMEN AND PELVIS WITH CONTRAST TECHNIQUE: Multidetector CT imaging of the abdomen and pelvis was performed using  the standard protocol following bolus administration of intravenous contrast. CONTRAST:  100mL ISOVUE-300 IOPAMIDOL (ISOVUE-300) INJECTION 61% COMPARISON:  June 18, 2009 FINDINGS: Lower chest: Lung bases are clear.  Hepatobiliary: There is hepatic steatosis. No focal liver lesions are appreciable. Gallbladder is absent. There is no appreciable biliary duct dilatation. Pancreas: No pancreatic mass or inflammatory focus. Spleen: No splenic lesions are evident. Adrenals/Urinary Tract: Adrenals bilaterally appear normal. Kidneys bilaterally show no evident mass or hydronephrosis on either side. There is no evident renal or ureteral calculus on either side. Urinary bladder is midline with wall thickness within normal limits. Stomach/Bowel: There is wall thickening in the proximal to mid sigmoid colon region with surrounding mesenteric inflammation and minimal fluid. Several diverticula are noted in this area. There is no abscess or evidence of perforation in this area of diverticulitis. There are occasional diverticula in the descending colon without inflammatory change in this area. Elsewhere, no bowel wall or mesenteric thickening. No evident bowel obstruction. No free air or portal venous air is evident. Vascular/Lymphatic: There is no abdominal aortic aneurysm. No vascular lesions are appreciable beyond minimal calcification in the aorta. There is a retroaortic left renal vein, an anatomic variant. Mesenteric arterial vessels are patent. There is no adenopathy in the abdomen or pelvis. Reproductive: Uterus is anteverted.  No evident pelvic mass. Other: Appendix absent. No abscess or ascites evident in the abdomen or pelvis. There is a small ventral hernia containing only fat. Musculoskeletal: There are no blastic or lytic bone lesions. No intramuscular or abdominal wall lesions evident. IMPRESSION: 1. Diverticulitis involving portions of the proximal mid sigmoid colon. There is no abscess or perforation in this area. 2. Occasional diverticula in the descending colon without inflammation. 3. No bowel obstruction. No abscess in the abdomen or pelvis. Appendix absent. 4.  No renal or ureteral calculi.  No hydronephrosis. 5. Hepatic  steatosis without focal liver lesion. Gallbladder absent. 6.  Small ventral hernia containing only fat. Electronically Signed   By: Bretta BangWilliam  Woodruff III M.D.   On: 02/10/2018 09:42    Procedures Procedures (including critical care time)  Medications Ordered in ED Medications  ondansetron (ZOFRAN) injection 4 mg (4 mg Intravenous Given 02/10/18 0826)  morphine 4 MG/ML injection 4 mg (4 mg Intravenous Given 02/10/18 0827)  sodium chloride 0.9 % bolus 1,000 mL (0 mLs Intravenous Stopped 02/10/18 1025)  iopamidol (ISOVUE-300) 61 % injection 100 mL (100 mLs Intravenous Contrast Given 02/10/18 0905)  ciprofloxacin (CIPRO) IVPB 400 mg (0 mg Intravenous Stopped 02/10/18 1132)  metroNIDAZOLE (FLAGYL) IVPB 500 mg (0 mg Intravenous Stopped 02/10/18 1223)  sodium chloride 0.9 % bolus 1,000 mL (0 mLs Intravenous Stopped 02/10/18 1223)     Initial Impression / Assessment and Plan / ED Course  I have reviewed the triage vital signs and the nursing notes.  Pertinent labs & imaging results that were available during my care of the patient were reviewed by me and considered in my medical decision making (see chart for details).     Patient was given IV fluids while here.  She was given her first dose of the Cipro and Flagyl per IV prior to discharge home.  Patient with diverticulitis with no evidence for perforation or microperforations.  She obtained moderate improvement in her symptom relief.  She had no vomiting while here and she remained afebrile.  Discussed home treatment and also discussed strict return precautions for any worsening symptoms which were outlined.  Advised follow-up exam otherwise with her PCP in 2 to  3 days, returning here for any worsening symptoms.  Discussed clear liquid to bland diet as symptoms will allow.  Final Clinical Impressions(s) / ED Diagnoses   Final diagnoses:  Diverticulitis    ED Discharge Orders        Ordered    ciprofloxacin (CIPRO) 500 MG tablet  Every 12 hours      02/10/18 1303    metroNIDAZOLE (FLAGYL) 500 MG tablet  2 times daily     02/10/18 1303    HYDROcodone-acetaminophen (NORCO/VICODIN) 5-325 MG tablet  Every 4 hours PRN,   Status:  Discontinued     02/10/18 1303    oxyCODONE-acetaminophen (PERCOCET/ROXICET) 5-325 MG tablet  Every 4 hours PRN     02/10/18 1315       Burgess Amor, PA-C 02/10/18 1646    Donnetta Hutching, MD 02/13/18 1018

## 2018-02-13 ENCOUNTER — Encounter: Payer: Self-pay | Admitting: Family Medicine

## 2018-02-13 ENCOUNTER — Ambulatory Visit: Payer: 59 | Admitting: Family Medicine

## 2018-02-13 VITALS — BP 136/86 | Temp 97.4°F | Ht 61.0 in | Wt 183.6 lb

## 2018-02-13 DIAGNOSIS — E876 Hypokalemia: Secondary | ICD-10-CM

## 2018-02-13 DIAGNOSIS — K5792 Diverticulitis of intestine, part unspecified, without perforation or abscess without bleeding: Secondary | ICD-10-CM | POA: Diagnosis not present

## 2018-02-13 MED ORDER — METRONIDAZOLE 500 MG PO TABS: 500.0000 mg | ORAL_TABLET | Freq: Two times a day (BID) | ORAL | 0 refills | Status: DC

## 2018-02-13 MED ORDER — CIPROFLOXACIN HCL 500 MG PO TABS: 500.0000 mg | ORAL_TABLET | Freq: Two times a day (BID) | ORAL | 0 refills | Status: DC

## 2018-02-13 NOTE — Progress Notes (Signed)
   Subjective:    Patient ID: Alicia Owens, female    DOB: 09/15/1975, 42 y.o.   MRN: [id number]  HPI Pt here for follow up from ER. Pt went to ER on 02/10/2018 and was dx with Diverticulitis. Pain not as bad; pain in lower left side. Still having nausea. Pt is taking Zofran but it is not really helping.  Cipro, Flagyl, and Percocet given in ED.  I reviewed over all of the ER notes including images and lab work Patient relates lower abdominal tenderness not quite as bad as what it was Denies high fever chills sweats Review of Systems Was having some low-grade fevers still having left lower quadrant abdominal pain some nausea no vomiting no diarrhea no bloody stools denies coughing wheezing    Objective:   Physical Exam Lungs are clear respiratory rate normal heart regular abdomen soft with moderate left lower quadrant tenderness no guarding or rebound       Assessment & Plan:  Continue the antibiotics for at least another week follow-up again in 1 week's time no need to repeat CAT scan at this point in time I recommend repeating lab work in 2 to 3 weeks to make sure her numbers are gone back to normal follow-up sooner problems

## 2018-02-14 ENCOUNTER — Encounter: Payer: Self-pay | Admitting: Family Medicine

## 2018-02-22 ENCOUNTER — Encounter: Payer: Self-pay | Admitting: Family Medicine

## 2018-02-22 ENCOUNTER — Ambulatory Visit: Payer: 59 | Admitting: Family Medicine

## 2018-02-22 VITALS — BP 122/76 | Ht 61.0 in | Wt 179.0 lb

## 2018-02-22 DIAGNOSIS — K5792 Diverticulitis of intestine, part unspecified, without perforation or abscess without bleeding: Secondary | ICD-10-CM

## 2018-02-22 NOTE — Patient Instructions (Signed)
Doing better  If bloody or mucous stools

## 2018-02-22 NOTE — Progress Notes (Signed)
   Subjective:    Patient ID: Alicia Owens, female    DOB: 03/17/1976, 42 y.o.   MRN: [id number]  HPIFollow up on diverticulitis. Pt is feeling better.   Pt states no other concerns today.  Patient with diverticulitis She is on her second round of antibiotics Denies fever chills sweats fever stopped middle last week States the discomfort is greatly improved Minimal discomfort in the lower abdomen Denies any other troubles   Review of Systems Please see above negative chest tightness pressure pain shortness breath fever chills sweats    Objective:   Physical Exam Lungs are clear heart regular  Abdomen is soft no guarding rebound minimal lower abdominal tenderness left side       Assessment & Plan:  Diverticulitis-minimal tenderness-making improvements-finish out antibiotics if ongoing troubles notify us  Significant improvement no need for follow-up at this point keep regular follow-ups later if feels like diverticulitis occurs again follow-up sooner

## 2018-03-10 ENCOUNTER — Encounter: Payer: Self-pay | Admitting: Family Medicine

## 2018-03-10 LAB — COMPREHENSIVE METABOLIC PANEL
A/G RATIO: 1.4 (ref 1.2–2.2)
ALK PHOS: 64 IU/L (ref 39–117)
ALT: 17 IU/L (ref 0–32)
AST: 17 IU/L (ref 0–40)
Albumin: 4.2 g/dL (ref 3.5–5.5)
BILIRUBIN TOTAL: 0.3 mg/dL (ref 0.0–1.2)
BUN/Creatinine Ratio: 6 — ABNORMAL LOW (ref 9–23)
BUN: 5 mg/dL — ABNORMAL LOW (ref 6–24)
CALCIUM: 9.4 mg/dL (ref 8.7–10.2)
CHLORIDE: 99 mmol/L (ref 96–106)
CO2: 26 mmol/L (ref 20–29)
Creatinine, Ser: 0.8 mg/dL (ref 0.57–1.00)
GFR calc Af Amer: 106 mL/min/{1.73_m2} (ref >59)
GFR, EST NON AFRICAN AMERICAN: 92 mL/min/{1.73_m2} (ref >59)
Globulin, Total: 2.9 g/dL (ref 1.5–4.5)
Glucose: 117 mg/dL — ABNORMAL HIGH (ref 65–99)
POTASSIUM: 3.9 mmol/L (ref 3.5–5.2)
Sodium: 140 mmol/L (ref 134–144)
Total Protein: 7.1 g/dL (ref 6.0–8.5)

## 2018-03-10 LAB — CBC WITH DIFFERENTIAL/PLATELET
BASOS ABS: 0.1 10*3/uL (ref 0.0–0.2)
Basos: 1 %
EOS (ABSOLUTE): 0.4 10*3/uL (ref 0.0–0.4)
Eos: 6 %
Hematocrit: 40.6 % (ref 34.0–46.6)
Hemoglobin: 13.2 g/dL (ref 11.1–15.9)
IMMATURE GRANS (ABS): 0 10*3/uL (ref 0.0–0.1)
IMMATURE GRANULOCYTES: 0 %
Lymphocytes Absolute: 2.7 10*3/uL (ref 0.7–3.1)
Lymphs: 36 %
MCH: 28.9 pg (ref 26.6–33.0)
MCHC: 32.5 g/dL (ref 31.5–35.7)
MCV: 89 fL (ref 79–97)
MONOS ABS: 0.6 10*3/uL (ref 0.1–0.9)
Monocytes: 8 %
NEUTROS PCT: 49 %
Neutrophils Absolute: 3.7 10*3/uL (ref 1.4–7.0)
PLATELETS: 341 10*3/uL (ref 150–450)
RBC: 4.56 x10E6/uL (ref 3.77–5.28)
RDW: 13.6 % (ref 12.3–15.4)
WBC: 7.5 10*3/uL (ref 3.4–10.8)

## 2018-03-11 ENCOUNTER — Other Ambulatory Visit: Payer: Self-pay | Admitting: *Deleted

## 2018-03-11 MED ORDER — METRONIDAZOLE 500 MG PO TABS: 500.0000 mg | ORAL_TABLET | Freq: Three times a day (TID) | ORAL | 0 refills | Status: DC

## 2018-03-11 MED ORDER — CIPROFLOXACIN HCL 750 MG PO TABS: 750.0000 mg | ORAL_TABLET | Freq: Two times a day (BID) | ORAL | 0 refills | Status: DC

## 2018-03-11 NOTE — Telephone Encounter (Signed)
Discussed with pt that meds were sent in and to go to ED if worse.

## 2018-03-11 NOTE — Telephone Encounter (Signed)
Left message to return call to discuss with pt and also meds have been sent to pharm.

## 2018-03-11 NOTE — Telephone Encounter (Signed)
Pt seen in ED on 6/9 and had ED follow up with dr Lorin Picketscott on 6/21 for diverticulitis. Pt states she had two rounds of cipro and flagyl and got better. On Friday she started having pain in LLQ same as when it started. No fever, no vomiting, no diarrhea but still having loose stools. She repeated bloodwork on Saturday.  [facility].

## 2018-03-18 ENCOUNTER — Encounter: Payer: Self-pay | Admitting: Family Medicine

## 2018-03-19 ENCOUNTER — Other Ambulatory Visit: Payer: Self-pay

## 2018-03-19 ENCOUNTER — Encounter: Payer: Self-pay | Admitting: Family Medicine

## 2018-03-19 ENCOUNTER — Ambulatory Visit: Payer: 59 | Admitting: Family Medicine

## 2018-03-19 ENCOUNTER — Encounter (HOSPITAL_COMMUNITY): Payer: Self-pay | Admitting: Emergency Medicine

## 2018-03-19 ENCOUNTER — Other Ambulatory Visit (HOSPITAL_COMMUNITY)
Admission: RE | Admit: 2018-03-19 | Discharge: 2018-03-19 | Disposition: A | Discharge status: Home / Self Care | Payer: 59 | Source: Ambulatory Visit | Attending: Family Medicine | Admitting: Family Medicine

## 2018-03-19 ENCOUNTER — Encounter (INDEPENDENT_AMBULATORY_CARE_PROVIDER_SITE_OTHER): Payer: Self-pay

## 2018-03-19 ENCOUNTER — Emergency Department (HOSPITAL_COMMUNITY): Payer: 59

## 2018-03-19 ENCOUNTER — Emergency Department (HOSPITAL_COMMUNITY)
Admission: EM | Admit: 2018-03-19 | Discharge: 2018-03-19 | Disposition: A | Discharge status: Home / Self Care | Payer: 59 | Attending: Emergency Medicine | Admitting: Emergency Medicine

## 2018-03-19 VITALS — BP 120/98 | Temp 98.5°F | Ht 61.5 in | Wt 174.0 lb

## 2018-03-19 DIAGNOSIS — E876 Hypokalemia: Secondary | ICD-10-CM | POA: Diagnosis not present

## 2018-03-19 DIAGNOSIS — R1032 Left lower quadrant pain: Secondary | ICD-10-CM | POA: Diagnosis present

## 2018-03-19 DIAGNOSIS — Z79899 Other long term (current) drug therapy: Secondary | ICD-10-CM | POA: Insufficient documentation

## 2018-03-19 DIAGNOSIS — K5792 Diverticulitis of intestine, part unspecified, without perforation or abscess without bleeding: Secondary | ICD-10-CM

## 2018-03-19 DIAGNOSIS — E039 Hypothyroidism, unspecified: Secondary | ICD-10-CM | POA: Insufficient documentation

## 2018-03-19 DIAGNOSIS — N289 Disorder of kidney and ureter, unspecified: Secondary | ICD-10-CM | POA: Insufficient documentation

## 2018-03-19 DIAGNOSIS — I10 Essential (primary) hypertension: Secondary | ICD-10-CM | POA: Insufficient documentation

## 2018-03-19 LAB — BASIC METABOLIC PANEL
Anion gap: 11 (ref 5–15)
BUN: 16 mg/dL (ref 6–20)
CALCIUM: 8.9 mg/dL (ref 8.9–10.3)
CO2: 26 mmol/L (ref 22–32)
CREATININE: 1.7 mg/dL — AB (ref 0.44–1.00)
Chloride: 98 mmol/L (ref 98–111)
GFR calc Af Amer: 42 mL/min — ABNORMAL LOW (ref >60)
GFR, EST NON AFRICAN AMERICAN: 36 mL/min — AB (ref >60)
Glucose, Bld: 96 mg/dL (ref 70–99)
POTASSIUM: 2.8 mmol/L — AB (ref 3.5–5.1)
SODIUM: 135 mmol/L (ref 135–145)

## 2018-03-19 LAB — CBC WITH DIFFERENTIAL/PLATELET
Basophils Absolute: 0 K/uL (ref 0.0–0.1)
Basophils Relative: 0 %
Eosinophils Absolute: 0.5 K/uL (ref 0.0–0.7)
Eosinophils Relative: 4 %
HCT: 38.6 % (ref 36.0–46.0)
Hemoglobin: 13 g/dL (ref 12.0–15.0)
Lymphocytes Relative: 25 %
Lymphs Abs: 2.9 K/uL (ref 0.7–4.0)
MCH: 29.3 pg (ref 26.0–34.0)
MCHC: 33.7 g/dL (ref 30.0–36.0)
MCV: 86.9 fL (ref 78.0–100.0)
Monocytes Absolute: 0.8 K/uL (ref 0.1–1.0)
Monocytes Relative: 7 %
Neutro Abs: 7.3 K/uL (ref 1.7–7.7)
Neutrophils Relative %: 64 %
Platelets: 338 K/uL (ref 150–400)
RBC: 4.44 MIL/uL (ref 3.87–5.11)
RDW: 12.6 % (ref 11.5–15.5)
WBC: 11.5 K/uL — ABNORMAL HIGH (ref 4.0–10.5)

## 2018-03-19 LAB — PREGNANCY, URINE: PREG TEST UR: NEGATIVE

## 2018-03-19 LAB — URINALYSIS, ROUTINE W REFLEX MICROSCOPIC
Bilirubin Urine: NEGATIVE
Glucose, UA: NEGATIVE mg/dL
Hgb urine dipstick: NEGATIVE
Ketones, ur: NEGATIVE mg/dL
Leukocytes, UA: NEGATIVE
Nitrite: NEGATIVE
Protein, ur: NEGATIVE mg/dL
Specific Gravity, Urine: 1.004 — ABNORMAL LOW (ref 1.005–1.030)
pH: 5 (ref 5.0–8.0)

## 2018-03-19 MED ORDER — POTASSIUM CHLORIDE CRYS ER 20 MEQ PO TBCR: 20.0000 meq | EXTENDED_RELEASE_TABLET | Freq: Two times a day (BID) | ORAL | 0 refills | Status: DC

## 2018-03-19 MED ORDER — SODIUM CHLORIDE 0.9 % IV BOLUS
1000.0000 mL | Freq: Once | INTRAVENOUS | Status: AC
  Administered 2018-03-19: 1000 mL via INTRAVENOUS

## 2018-03-19 MED ORDER — CEFTRIAXONE SODIUM 500 MG IJ SOLR
500.0000 mg | Freq: Once | INTRAMUSCULAR | Status: AC
  Administered 2018-03-19: 500 mg via INTRAMUSCULAR

## 2018-03-19 MED ORDER — POTASSIUM CHLORIDE 10 MEQ/100ML IV SOLN
10.0000 meq | Freq: Once | INTRAVENOUS | Status: AC
  Administered 2018-03-19: 10 meq via INTRAVENOUS
  Filled 2018-03-19: qty 100

## 2018-03-19 MED ORDER — AMOXICILLIN-POT CLAVULANATE 875-125 MG PO TABS: ORAL_TABLET | ORAL | 0 refills | Status: DC

## 2018-03-19 NOTE — ED Provider Notes (Signed)
[facility] Provider Note   CSN: [id number] Arrival date & time: 03/19/18  1639     History   Chief Complaint Chief Complaint  Patient presents with  . Abnormal Lab    HPI Alicia Owens is a 42 y.o. female.  HPI Patient sent in by PCP.  Over the last 2 months has had diverticulitis.  Just started her third course of Cipro and Flagyl around a week ago.  Developed nausea and vomiting however.  Pain had been improving in her left lower abdomen then she stopped antibiotics and pain returned.  Saw Dr. Gerda DissLuking today in the office.  Draw blood work that showed mildly increased white cells and increased creatinine.  Had had decreased oral intake but has not vomited last couple days since stopping the antibiotics.  Sent in for further treatment.  Also hypokalemic.  Patient states she has been tolerating orals and the pain is feeling better.  Got a shot of Rocephin and a prescription for Augmentin from her PCP. Past Medical History:  Diagnosis Date  . Anemia   . Anxiety   . Chronic abdominal pain   . Depression   . Gastroparesis   . GERD (gastroesophageal reflux disease)   . Headache(784.0)   . Hypertension   . Hyperthyroidism 2011  . IBS (irritable bowel syndrome)   . PONV (postoperative nausea and vomiting)   . PP care - C/S 12/17 08/22/2011    Patient Active Problem List   Diagnosis Date Noted  . Pedal edema 11/21/2017  . Reactive airway disease 09/27/2016  . IBS (irritable bowel syndrome) 07/22/2013  . Migraines 12/17/2012  . Hypertension 10/16/2011  . Elevated liver enzymes 10/16/2011  . PP care - C/S 12/17 08/22/2011  . ANXIETY DEPRESSION 08/20/2009  . GASTRITIS, HX OF 05/20/2009  . GERD 05/03/2009  . CHEST PAIN 05/03/2009    Past Surgical History:  Procedure Laterality Date  . APPENDECTOMY    . BASAL CELL CARCINOMA EXCISION  02/2006   face  . BREAST SURGERY     Breast reduction  . CESAREAN SECTION  2007 and 2012   x2  . CHOLECYSTECTOMY   12/15/2011   Procedure: LAPAROSCOPIC CHOLECYSTECTOMY;  Surgeon: Dalia HeadingMark A Jenkins, MD;  Location: [facility];  Service: General;  Laterality: N/A;  . COLONOSCOPY    . ESOPHAGOGASTRODUODENOSCOPY    . TUBAL LIGATION     with last c-section  . WISDOM TOOTH EXTRACTION  08/2003     OB History    Gravida  2   Para  2   Term  2   Preterm      AB      Living  2     SAB      TAB      Ectopic      Multiple      Live Births  1            Home Medications    Prior to Admission medications   Medication Sig Start Date End Date Taking? Authorizing Provider  albuterol (PROVENTIL HFA;VENTOLIN HFA) 108 (90 Base) MCG/ACT inhaler Inhale 2 puffs into the lungs every 6 (six) hours as needed for wheezing or shortness of breath. 08/10/17   Babs SciaraLuking, Scott A, MD  ALPRAZolam Prudy Feeler(XANAX) 0.25 MG tablet TAKE ONE TABLET TWICE DAILY AS NEEDED 11/21/17   Babs SciaraLuking, Scott A, MD  amoxicillin-clavulanate (AUGMENTIN) 875-125 MG tablet One po TID 03/19/18   Merlyn AlbertLuking, William S, MD  beclomethasone (QVAR REDIHALER) 80 MCG/ACT inhaler  Inhale 2 puffs into the lungs 2 (two) times daily. 10/11/17   Babs Sciara, MD  busPIRone (BUSPAR) 15 MG tablet TAKE ONE (1) TABLET THREE (3) TIMES EACH DAY 11/21/17   Babs Sciara, MD  ciprofloxacin (CIPRO) 750 MG tablet Take 1 tablet (750 mg total) by mouth 2 (two) times daily. 03/11/18   Merlyn Albert, MD  citalopram (CELEXA) 20 MG tablet Take 1 tablet (20 mg total) by mouth daily. 11/21/17   Babs Sciara, MD  cyclobenzaprine (FLEXERIL) 10 MG tablet TAKE ONE TABLET BY MOUTH THREE TIMES DAILY AS NEEDED BETWEEN MEALS AND AT BEDTIME 02/12/18   Luking, Jonna Coup, MD  dicyclomine (BENTYL) 20 MG tablet TAKE ONE TABLET THREE TIMES DAILY AS NEEDED OR AS DIRECTED 05/08/17   Babs Sciara, MD  fexofenadine (ALLEGRA) 180 MG tablet Take 180 mg by mouth daily.    [provider]  hydrochlorothiazide (HYDRODIURIL) 25 MG tablet Take 1 tablet (25 mg total) by mouth daily. 01/29/18   Babs Sciara, MD  ibuprofen (ADVIL,MOTRIN) 200 MG tablet Take 400 mg by mouth every 6 (six) hours as needed.     [provider]  Lactobacillus-Inulin (CULTURELLE DIGESTIVE HEALTH) CAPS Take 1 capsule by mouth daily.    [provider]  mometasone (ELOCON) 0.1 % cream Apply BID as needed 11/21/17   Babs Sciara, MD  mometasone (NASONEX) 50 MCG/ACT nasal spray Place 2 sprays into the nose as needed.     [provider]  Multiple Vitamins-Minerals (MULTIVITAMIN WITH MINERALS) tablet Take 1 tablet by mouth daily.    [provider]  ondansetron (ZOFRAN) 8 MG tablet TAKE ONE TABLET THREE TO FOUR TIMES A DAY AS NEEDED 06/13/17   Babs Sciara, MD  oxyCODONE-acetaminophen (PERCOCET/ROXICET) 5-325 MG tablet Take 1 tablet by mouth every 4 (four) hours as needed. 02/10/18   Burgess Amor, PA-C  pantoprazole (PROTONIX) 40 MG tablet Take 1 tablet (40 mg total) by mouth 2 (two) times daily. 11/21/17   Babs Sciara, MD  potassium chloride (K-DUR) 10 MEQ tablet One tablet daily as needed with the HCTZ 11/21/17   Luking, Jonna Coup, MD  SUMAtriptan (IMITREX) 100 MG tablet TAKE ONE TABLET AT ONSET OF HEADACHE. MAY REPEAT ONE TIME IN TWO HOURS IF NO RELIEF. DO NOT EXCEED TWO TABLETS IN 24 HOURS. 08/27/17   Babs Sciara, MD  SUPER B COMPLEX/C PO Take 1 tablet by mouth daily.     [provider]  SV CRANBERRY PO Take 1 tablet by mouth daily.     [provider]  topiramate (TOPAMAX) 50 MG tablet Take 1 tablet (50 mg total) by mouth 2 (two) times daily. Patient taking differently: Take 50 mg by mouth daily.  11/14/17   Babs Sciara, MD  TURMERIC PO Take 1 capsule by mouth daily.     [provider]  VITAMIN D, CHOLECALCIFEROL, PO Take by mouth 3 (three) times a week.    [provider]    Family History Family History  Problem Relation Age of Onset  . Healthy Daughter   . Healthy Son   . Hypertension Mother   . Hypertension Father   .  Hyperlipidemia Father   . Hyperlipidemia Other   . Hypertension Other   . Anesthesia problems Neg Hx   . Hypotension Neg Hx   . Malignant hyperthermia Neg Hx   . Pseudochol deficiency Neg Hx     Social History Social History  Tobacco Use  . Smoking status: Never Smoker  . Smokeless tobacco: Never Used  Substance Use Topics  . Alcohol use: No  . Drug use: No     Allergies   Codeine; Promethazine hcl; Reglan [metoclopramide]; and Sulfonamide derivatives   Review of Systems Review of Systems  Constitutional: Negative for appetite change.  HENT: Negative for congestion.   Respiratory: Negative for shortness of breath.   Cardiovascular: Negative for chest pain.  Gastrointestinal: Positive for abdominal pain, nausea and vomiting. Negative for constipation.  Genitourinary: Negative for dysuria.  Musculoskeletal: Negative for back pain.  Neurological: Negative for numbness.  Hematological: Negative for adenopathy.  Psychiatric/Behavioral: Negative for confusion.     Physical Exam Updated Vital Signs BP 138/83   Pulse 70   Temp 98.2 F (36.8 C) (Oral)   Resp 17   Ht 5\' 1"  (1.549 m)   Wt 78.9 kg (174 lb)   LMP 02/20/2018   SpO2 100%   BMI 32.88 kg/m   Physical Exam  Constitutional: She appears well-developed.  HENT:  Head: Atraumatic.  Eyes: Pupils are equal, round, and reactive to light.  Cardiovascular: Normal rate.  Pulmonary/Chest: Effort normal.  Abdominal: There is tenderness.  Left lower quadrant tenderness without rebound or guarding.  Musculoskeletal: She exhibits no tenderness.  Neurological: She is alert.  Skin: Skin is warm. Capillary refill takes less than 2 seconds.  Psychiatric: She has a normal mood and affect.     ED Treatments / Results  Labs (all labs ordered are listed, but only abnormal results are displayed) Labs Reviewed  URINALYSIS, ROUTINE W REFLEX MICROSCOPIC - Abnormal; Notable for the following components:      Result Value    Color, Urine STRAW (*)    Specific Gravity, Urine 1.004 (*)    All other components within normal limits  PREGNANCY, URINE    EKG None  Radiology Ct Abdomen Pelvis Wo Contrast  Result Date: 03/19/2018 CLINICAL DATA:  Lower abdominal pain x1 week with vomiting on Saturday and Sunday. EXAM: CT ABDOMEN AND PELVIS WITHOUT CONTRAST TECHNIQUE: Multidetector CT imaging of the abdomen and pelvis was performed following the standard protocol without IV contrast. COMPARISON:  02/10/2018 FINDINGS: Lower chest: No acute abnormality. Hepatobiliary: Steatosis of the liver. No biliary dilatation. Cholecystectomy. Pancreas: Normal Spleen: Normal Adrenals/Urinary Tract: Adrenal glands are unremarkable. Kidneys are normal, without renal calculi, focal lesion, or hydronephrosis. Bladder is unremarkable. Stomach/Bowel: Small focus of pericolonic inflammation at the junction of the descending and sigmoid colon consistent with acute uncomplicated diverticulitis. No free air or abscess. No bowel obstruction. Moderate fecal retention within the colon. Nondistended stomach with normal small bowel rotation. No small bowel dilatation or obstruction. Appendectomy. Vascular/Lymphatic: Minimal atherosclerosis of the nonaneurysmal abdominal aorta. Retroaortic left renal vein. No lymphadenopathy. Reproductive: Uterus and bilateral adnexa are unremarkable. Other: Small fat containing periumbilical hernia as before. No abdominopelvic ascites. Musculoskeletal: No acute or significant osseous findings. IMPRESSION: 1. Acute uncomplicated diverticulitis at the junction of the descending and sigmoid colon. 2. Hepatic steatosis. Electronically Signed   By: Tollie Eth M.D.   On: 03/19/2018 21:09    Procedures Procedures (including critical care time)  Medications Ordered in ED Medications  sodium chloride 0.9 % bolus 1,000 mL (1,000 mLs Intravenous New Bag/Given 03/19/18 2121)  potassium chloride 10 mEq in 100 mL IVPB (10 mEq  Intravenous New Bag/Given 03/19/18 2121)     Initial Impression / Assessment and Plan / ED Course  I have reviewed the triage vital signs and  the nursing notes.  Pertinent labs & imaging results that were available during my care of the patient were reviewed by me and considered in my medical decision making (see chart for details).    Patient with diverticulitis.  Has returned on CT scan.  Otherwise uncomplicated except for increased creatinine.  She has however been tolerating orals since stopping antibiotic.  Hypokalemia.  Will supplement potassium and give IV fluids.  We will follow-up with PCP if recheck potassium and creatinine later.  Will take Augmentin that she is been prescribed.  Discharge home.   Final Clinical Impressions(s) / ED Diagnoses   Final diagnoses:  Hypokalemia  Diverticulitis  Renal insufficiency    ED Discharge Orders    None       Benjiman Core, MD 03/19/18 2128

## 2018-03-19 NOTE — ED Notes (Signed)
Patient transported to CT 

## 2018-03-19 NOTE — Progress Notes (Signed)
   Subjective:    Patient ID: Alicia [facility], female    DOB: 12/15/1975, 42 y.o.   MRN: [id number]  HPI  Patient is here today with complaints of what she thinks is diverticulitis.She states she has been having some vomiting and diarrhea and abd pain since last Saturday she called here and was given the cipro and the flagyl started those on Tuesday abd pain got better.She states she started taking flagyl and the  cipro and started vomiting and abd pain returned yesterday.  Started a third round of cipro and tue based on symtoms with a phone call  fri eve pain had improved but n and vomiting hit fairly hard, all day sat threw up. Very tired  Not much sleep over the weekend   Sun morn got up an dfelt bad  Stopped cipro and flagyl sun morn  yest morn pain started recurring,   No fever but some chills at times  Pos hx of colonoscopy       Review of Systems  No headache, no major weight loss or weight gain, no chest pain no back pain abdominal pain no change in bowel habits complete ROS otherwise negative         Objective:   Physical Exam Alert and oriented, vitals reviewed and stable, NAD ENT-TM's and ext canals WNL bilat via otoscopic exam Soft palate, tonsils and post pharynx WNL via oropharyngeal exam Neck-symmetric, no masses; thyroid nonpalpable and nontender Pulmonary-no tachypnea or accessory muscle use; Clear without wheezes via auscultation Card--no abnrml murmurs, rhythm reg and rate WNL Carotid pulses symmetric, without bruits   Left lower quadrant distinct tenderness.  Bowel sounds present.  No rebound no guarding  Sent for stat labs.  White blood count returned 11,000.  Creatinine went from 1.05 up to 1.8.  Potassium 2.8.  GFR low.  Had discussion with Dr. Lorin PicketScott regarding this patient.  This appears to represent failed outpatient diverticulitis with now renal insufficiency likely secondary to volume contraction.  Discussed with patient.  Discussed with the ER  doctor.  Recommend add on to ER for at the very least hydration and potential scanning and potential admission  Greater than 50% of this 25 minute face to face visit was spent in counseling and discussion and coordination of care regarding the above diagnosis/diagnosies       Assessment & Plan:

## 2018-03-19 NOTE — ED Triage Notes (Addendum)
Patient states she saw Dr Gerda DissLuking today for lower abdominal pain x 1 week and vomiting on Saturday and Sunday. States she was given cipro and flagyl last week but unable to tolerate so she stopped the medication Sunday. States Dr Gerda DissLuking ordered blood work and advised her to come to ER for fluids and CT of abdomen.

## 2018-03-20 ENCOUNTER — Encounter: Payer: Self-pay | Admitting: Family Medicine

## 2018-03-20 ENCOUNTER — Ambulatory Visit: Payer: 59 | Admitting: Family Medicine

## 2018-03-20 VITALS — BP 118/74 | Temp 98.4°F | Ht 61.0 in | Wt 179.0 lb

## 2018-03-20 DIAGNOSIS — E876 Hypokalemia: Secondary | ICD-10-CM

## 2018-03-20 DIAGNOSIS — K5792 Diverticulitis of intestine, part unspecified, without perforation or abscess without bleeding: Secondary | ICD-10-CM

## 2018-03-20 NOTE — Progress Notes (Signed)
   Subjective:    Patient ID: Alicia [facility], female    DOB: 03/06/1976, 42 y.o.   MRN: [id number]  HPIFollow up ER visit for diverticulitis.  Patient with significant lower abdominal pain discomfort she had a CAT scan back in June showed diverticulitis she been treated with 2 rounds of antibiotics finally could not tolerate Cipro and Flagyl having a lot of vomiting and diarrhea which she will presented yesterday with ongoing pain switched to Augmentin creatinine was elevated potassium was low sent to the ER for IV fluids feels a little bit better now denies high fever chills   Review of Systems  Constitutional: Negative for activity change, appetite change and fatigue.  HENT: Negative for congestion, rhinorrhea and sinus pain.   Respiratory: Negative for cough.   Cardiovascular: Negative for chest pain.  Gastrointestinal: Positive for abdominal pain and diarrhea. Negative for nausea.  Endocrine: Negative for polydipsia and polyphagia.  Skin: Negative for color change.  Neurological: Negative for weakness.  Psychiatric/Behavioral: Negative for confusion.       Objective:   Physical Exam  Constitutional: She appears well-nourished. No distress.  HENT:  Head: Normocephalic and atraumatic.  Eyes: Right eye exhibits no discharge. Left eye exhibits no discharge.  Cardiovascular: Normal rate, regular rhythm and normal heart sounds.  No murmur heard. Pulmonary/Chest: Effort normal and breath sounds normal. No respiratory distress.  Abdominal: Soft. She exhibits no distension. There is tenderness.  Musculoskeletal: She exhibits no edema.  Lymphadenopathy:    She has no cervical adenopathy.  Neurological: She is alert. She exhibits normal muscle tone.  Psychiatric: Her behavior is normal.  Vitals reviewed.         Assessment & Plan:  15 minutes was spent with patient today discussing healthcare issues which they came.  More than 50% of this visit-total duration of visit-was spent in  counseling and coordination of care.  Please see diagnosis regarding the focus of this coordination and care  Diverticulitis continue Augmentin until finished follow-up in 2 weeks for recheck check lab work again later in week to relook at kidney function as well as potassium importance of fluids and bland diet  We will touch base with Dr. Verdene LennertVermont to see if he feels any follow-up testing or colonoscopy necessary-currently right now I doubt it

## 2018-03-22 ENCOUNTER — Encounter (INDEPENDENT_AMBULATORY_CARE_PROVIDER_SITE_OTHER): Payer: Self-pay

## 2018-03-22 ENCOUNTER — Encounter: Payer: Self-pay | Admitting: Family Medicine

## 2018-03-22 ENCOUNTER — Telehealth: Payer: Self-pay | Admitting: Family Medicine

## 2018-03-22 LAB — BASIC METABOLIC PANEL
BUN / CREAT RATIO: 6 — AB (ref 9–23)
BUN: 7 mg/dL (ref 6–24)
CHLORIDE: 98 mmol/L (ref 96–106)
CO2: 25 mmol/L (ref 20–29)
Calcium: 9.6 mg/dL (ref 8.7–10.2)
Creatinine, Ser: 1.16 mg/dL — ABNORMAL HIGH (ref 0.57–1.00)
GFR calc non Af Amer: 59 mL/min/{1.73_m2} — ABNORMAL LOW (ref >59)
GFR, EST AFRICAN AMERICAN: 68 mL/min/{1.73_m2} (ref >59)
Glucose: 92 mg/dL (ref 65–99)
POTASSIUM: 4 mmol/L (ref 3.5–5.2)
Sodium: 140 mmol/L (ref 134–144)

## 2018-03-22 NOTE — Telephone Encounter (Signed)
If the patient needs work excuse for today please give to her.

## 2018-03-22 NOTE — Telephone Encounter (Signed)
Notified patient via MyChart

## 2018-03-22 NOTE — Telephone Encounter (Signed)
Patient wants to know if Dr. Lorin PicketScott thinks she can work 6 hours today?  She is feeling weak and doesn't think she can make it all day today.

## 2018-03-22 NOTE — Telephone Encounter (Signed)
Patient called back and said never mind she was going to stay  Home today instead.

## 2018-03-25 ENCOUNTER — Other Ambulatory Visit: Payer: Self-pay | Admitting: *Deleted

## 2018-03-27 ENCOUNTER — Encounter: Payer: Self-pay | Admitting: Family Medicine

## 2018-04-02 ENCOUNTER — Ambulatory Visit: Payer: 59 | Admitting: Family Medicine

## 2018-04-02 VITALS — BP 120/80 | Ht 61.0 in | Wt 170.6 lb

## 2018-04-02 DIAGNOSIS — K5792 Diverticulitis of intestine, part unspecified, without perforation or abscess without bleeding: Secondary | ICD-10-CM | POA: Insufficient documentation

## 2018-04-02 MED ORDER — AMOXICILLIN-POT CLAVULANATE 875-125 MG PO TABS: ORAL_TABLET | ORAL | 0 refills | Status: DC

## 2018-04-02 NOTE — Progress Notes (Signed)
   Subjective:    Patient ID: Alicia [facility], female    DOB: 04/28/1976, 42 y.o.   MRN: [id number]  HPI Patient arrives to follow up on diverticulitis Here for follow-up on diverticulitis relates left lower quadrant area much better minimal tenderness denies high fever chills sweats denies rectal bleeding denies any chest pressure tightness feeling much better   Review of Systems  Constitutional: Negative for activity change, appetite change and fatigue.  HENT: Negative for congestion and rhinorrhea.   Respiratory: Negative for cough and shortness of breath.   Cardiovascular: Negative for chest pain and leg swelling.  Gastrointestinal: Negative for abdominal pain and diarrhea.  Endocrine: Negative for polydipsia and polyphagia.  Skin: Negative for color change.  Neurological: Negative for dizziness and weakness.  Psychiatric/Behavioral: Negative for behavioral problems and confusion.       Objective:   Physical Exam  Constitutional: She appears well-nourished. No distress.  HENT:  Head: Normocephalic and atraumatic.  Eyes: Right eye exhibits no discharge. Left eye exhibits no discharge.  Neck: No tracheal deviation present.  Cardiovascular: Normal rate, regular rhythm and normal heart sounds.  No murmur heard. Pulmonary/Chest: Effort normal and breath sounds normal. No respiratory distress.  Abdominal: Soft. She exhibits no distension. There is no tenderness. There is no guarding.  Musculoskeletal: She exhibits no edema.  Lymphadenopathy:    She has no cervical adenopathy.  Neurological: She is alert. Coordination normal.  Skin: Skin is warm and dry.  Psychiatric: She has a normal mood and affect. Her behavior is normal.  Vitals reviewed.     Warning signs were discussed follow-up if ongoing troubles An additional prescription of Augmentin given just in case she needs it while on vacation this coming week Notify us if any problems    Assessment & Plan:   Diverticulitis Doing much better No need for further antibiotics  Mild renal insufficiency healthy diet minimize protein recheck lab work again within 6 months

## 2018-04-04 ENCOUNTER — Ambulatory Visit (INDEPENDENT_AMBULATORY_CARE_PROVIDER_SITE_OTHER): Payer: 59 | Admitting: Internal Medicine

## 2018-04-05 ENCOUNTER — Ambulatory Visit: Payer: 59 | Admitting: Family Medicine

## 2018-04-24 ENCOUNTER — Ambulatory Visit (INDEPENDENT_AMBULATORY_CARE_PROVIDER_SITE_OTHER): Payer: 59 | Admitting: Internal Medicine

## 2018-05-08 ENCOUNTER — Telehealth: Payer: Self-pay | Admitting: Family Medicine

## 2018-05-08 NOTE — Telephone Encounter (Signed)
Nurses Call patient Let her know that I conferred with Dr.Rehman regarding the recent colon infection she had He recommends a follow-up colonoscopy this fall Please go ahead and put in referral Confirm with the patient of her preference regarding GI-I believe that Dr.Rehman would do a outstanding job Follow-up with [place] if any problems

## 2018-05-08 NOTE — Progress Notes (Signed)
A phone message was sent to the nurses to communicate with the patient the need for colonoscopy

## 2018-05-09 NOTE — Telephone Encounter (Signed)
Left message to return call 

## 2018-05-13 NOTE — Telephone Encounter (Signed)
I called and left a message asked that she r/c. 

## 2018-05-14 ENCOUNTER — Encounter: Payer: Self-pay | Admitting: Family Medicine

## 2018-05-14 ENCOUNTER — Ambulatory Visit: Payer: 59 | Admitting: Family Medicine

## 2018-05-14 VITALS — BP 124/78 | Temp 98.8°F | Ht 61.0 in | Wt 171.0 lb

## 2018-05-14 DIAGNOSIS — K219 Gastro-esophageal reflux disease without esophagitis: Secondary | ICD-10-CM

## 2018-05-14 DIAGNOSIS — R300 Dysuria: Secondary | ICD-10-CM

## 2018-05-14 DIAGNOSIS — E069 Thyroiditis, unspecified: Secondary | ICD-10-CM

## 2018-05-14 DIAGNOSIS — R600 Localized edema: Secondary | ICD-10-CM

## 2018-05-14 DIAGNOSIS — G43009 Migraine without aura, not intractable, without status migrainosus: Secondary | ICD-10-CM | POA: Diagnosis not present

## 2018-05-14 DIAGNOSIS — K5792 Diverticulitis of intestine, part unspecified, without perforation or abscess without bleeding: Secondary | ICD-10-CM

## 2018-05-14 LAB — POCT URINALYSIS DIPSTICK: pH, UA: 7 (ref 5.0–8.0)

## 2018-05-14 MED ORDER — CITALOPRAM HYDROBROMIDE 20 MG PO TABS: 20.0000 mg | ORAL_TABLET | Freq: Every day | ORAL | 5 refills | Status: DC

## 2018-05-14 MED ORDER — BECLOMETHASONE DIPROP HFA 80 MCG/ACT IN AERB: 2.0000 | INHALATION_SPRAY | Freq: Two times a day (BID) | RESPIRATORY_TRACT | 5 refills | Status: DC

## 2018-05-14 MED ORDER — TOPIRAMATE 50 MG PO TABS: 50.0000 mg | ORAL_TABLET | Freq: Two times a day (BID) | ORAL | 5 refills | Status: DC

## 2018-05-14 MED ORDER — PANTOPRAZOLE SODIUM 40 MG PO TBEC: 40.0000 mg | DELAYED_RELEASE_TABLET | Freq: Two times a day (BID) | ORAL | 5 refills | Status: DC

## 2018-05-14 MED ORDER — BUSPIRONE HCL 15 MG PO TABS: ORAL_TABLET | ORAL | 5 refills | Status: DC

## 2018-05-14 MED ORDER — HYDROCHLOROTHIAZIDE 25 MG PO TABS: 25.0000 mg | ORAL_TABLET | Freq: Every day | ORAL | 4 refills | Status: DC

## 2018-05-14 MED ORDER — POTASSIUM CHLORIDE ER 10 MEQ PO TBCR: EXTENDED_RELEASE_TABLET | ORAL | 5 refills | Status: DC

## 2018-05-14 MED ORDER — ALPRAZOLAM 0.25 MG PO TABS: ORAL_TABLET | ORAL | 5 refills | Status: DC

## 2018-05-14 NOTE — Telephone Encounter (Signed)
Patient was seen in office 05/14/18 was this done at office visit

## 2018-05-14 NOTE — Progress Notes (Signed)
Subjective:    Patient ID: Alicia [facility], female    DOB: 1975/11/13, 42 y.o.   MRN: [id number]  Hypertension  This is a chronic problem. Pertinent negatives include no chest pain or shortness of breath. Treatments tried: hctz.  Patient for blood pressure check up.  The patient does have hypertension.  The patient is on medication.  Patient relates compliance with meds. Todays BP reviewed with the patient. Patient denies issues with medication. Patient relates reasonable diet. Patient tries to minimize salt. Patient aware of BP goals.  took left over keflex because she thought she was getting a UTI. Symptoms cleared up now.  Patient denies any dysuria urinary urgency high fever sweats chills  She denies any major flareups of her asthma but she states the cost of the Qvar is getting tough she wonders if there is other strategies  She relates the pedal edema is under good control she watches her diet she takes her medicine on a regular basis  She has had flareups of diverticulitis we did discuss with her GI doctor who recommended a colonoscopy she has an appointment in December but we will see if this can be moved up no current pain or discomfort   For the past couple of days pt can not stand to touch neck of the left side.  She relates left side of her neck is tender to the touch anterior portion near the thyroid region denies any other symptoms currently  Results for orders placed or performed in visit on 05/14/18  POCT urinalysis dipstick  Result Value Ref Range   Color, UA     Clarity, UA     Glucose, UA     Bilirubin, UA     Ketones, UA     Spec Grav, UA <=1.005 (A) 1.010 - 1.025   Blood, UA     pH, UA 7.0 5.0 - 8.0   Protein, UA     Urobilinogen, UA     Nitrite, UA     Leukocytes, UA     Appearance     Odor       Review of Systems  Constitutional: Negative for activity change, appetite change and fatigue.  HENT: Negative for congestion and rhinorrhea.   Respiratory:  Negative for cough and shortness of breath.   Cardiovascular: Negative for chest pain and leg swelling.  Gastrointestinal: Negative for abdominal pain and diarrhea.  Skin: Negative for color change.  Neurological: Negative for dizziness and weakness.  Psychiatric/Behavioral: Negative for behavioral problems and confusion.       Objective:   Physical Exam  Constitutional: She appears well-nourished. No distress.  HENT:  Head: Normocephalic and atraumatic.  Eyes: Right eye exhibits no discharge. Left eye exhibits no discharge.  Neck: No tracheal deviation present.  Cardiovascular: Normal rate, regular rhythm and normal heart sounds.  No murmur heard. Pulmonary/Chest: Effort normal and breath sounds normal. No respiratory distress.  Musculoskeletal: She exhibits no edema.  Lymphadenopathy:    She has no cervical adenopathy.  Neurological: She is alert. Coordination normal.  Skin: Skin is warm and dry.  Psychiatric: She has a normal mood and affect. Her behavior is normal.  Vitals reviewed.   Tenderness in the thyroid region this is consistent with the possibility of thyroiditis no obvious mass felt      Assessment & Plan:  Pedal edema-this seems to be under control with the diuretic use and potassium as needed with it denies excessive salt intake   GAD stress  levels good denies being depressed does tolerate Celexa that she would like to continue with using Xanax infrequently   Neck pain significant pain possible thyroiditis recommend lab test await the results of this   GERD significant reflux issues.  Recommend Ongoing PPI.  Under good control currently  Migraines migraines under good control currently with standard medication continue these at this time  Allergy issues under decent control continue these measures.   Reactive airway asthma under good control she may revert the Qvar to being on a as needed basis to use it mainly during flareups for multiple days in a  row with the albuterol on a as needed basis  Follow-up 6 months  25 minutes was spent with the patient.  This statement verifies that 25 minutes was indeed spent with the patient.  More than 50% of this visit-total duration of the visit-was spent in counseling and coordination of care. The issues that the patient came in for today as reflected in the diagnosis (s) please refer to documentation for further details.   UTI follow up no sign of infection currently  Reoccurring diverticulitis none currently recommend follow-up office visit with gastroenterology they recommend colonoscopy already spoke with the gastroenterologist according to this hopefully they can fit her in sooner   Thyroid pain-please see above

## 2018-05-15 LAB — SEDIMENTATION RATE: Sed Rate: 42 mm/hr — ABNORMAL HIGH (ref 0–32)

## 2018-05-15 LAB — T4, FREE: Free T4: 0.99 ng/dL (ref 0.82–1.77)

## 2018-05-15 LAB — TSH: TSH: 1.48 u[IU]/mL (ref 0.450–4.500)

## 2018-05-15 LAB — T3 UPTAKE: T3 Uptake Ratio: 21 % — ABNORMAL LOW (ref 24–39)

## 2018-05-15 LAB — C-REACTIVE PROTEIN: CRP: 9 mg/L (ref 0–10)

## 2018-05-17 ENCOUNTER — Other Ambulatory Visit: Payer: Self-pay | Admitting: Family Medicine

## 2018-05-17 NOTE — Addendum Note (Signed)
Addended by: Meredith LeedsSUTTON, Guadalupe Nickless L on: 05/17/2018 04:21 PM   Modules accepted: Orders

## 2018-05-24 ENCOUNTER — Ambulatory Visit (HOSPITAL_COMMUNITY)
Admission: RE | Admit: 2018-05-24 | Discharge: 2018-05-24 | Disposition: A | Discharge status: Home / Self Care | Payer: 59 | Source: Ambulatory Visit | Attending: Family Medicine | Admitting: Family Medicine

## 2018-05-24 DIAGNOSIS — E069 Thyroiditis, unspecified: Secondary | ICD-10-CM | POA: Diagnosis present

## 2018-05-25 LAB — TSH: TSH: 0.996 u[IU]/mL (ref 0.450–4.500)

## 2018-05-25 LAB — T3 UPTAKE: T3 Uptake Ratio: 22 % — ABNORMAL LOW (ref 24–39)

## 2018-05-25 LAB — T4, FREE: Free T4: 0.98 ng/dL (ref 0.82–1.77)

## 2018-05-25 LAB — SEDIMENTATION RATE: Sed Rate: 41 mm/hr — ABNORMAL HIGH (ref 0–32)

## 2018-06-17 ENCOUNTER — Other Ambulatory Visit: Payer: Self-pay | Admitting: Family Medicine

## 2018-07-03 ENCOUNTER — Ambulatory Visit (INDEPENDENT_AMBULATORY_CARE_PROVIDER_SITE_OTHER): Payer: 59 | Admitting: *Deleted

## 2018-07-03 DIAGNOSIS — Z23 Encounter for immunization: Secondary | ICD-10-CM | POA: Diagnosis not present

## 2018-07-22 ENCOUNTER — Other Ambulatory Visit: Payer: Self-pay | Admitting: Family Medicine

## 2018-08-13 ENCOUNTER — Ambulatory Visit (INDEPENDENT_AMBULATORY_CARE_PROVIDER_SITE_OTHER): Payer: 59 | Admitting: Internal Medicine

## 2018-08-13 ENCOUNTER — Ambulatory Visit: Payer: 59 | Admitting: Family Medicine

## 2018-08-13 ENCOUNTER — Encounter: Payer: Self-pay | Admitting: Family Medicine

## 2018-08-13 VITALS — BP 116/84 | Temp 97.7°F | Ht 61.0 in | Wt 173.0 lb

## 2018-08-13 DIAGNOSIS — K5792 Diverticulitis of intestine, part unspecified, without perforation or abscess without bleeding: Secondary | ICD-10-CM

## 2018-08-13 MED ORDER — FLUCONAZOLE 150 MG PO TABS: 150.0000 mg | ORAL_TABLET | Freq: Once | ORAL | 2 refills | Status: DC

## 2018-08-13 MED ORDER — AMOXICILLIN-POT CLAVULANATE 875-125 MG PO TABS: 1.0000 | ORAL_TABLET | Freq: Two times a day (BID) | ORAL | 0 refills | Status: DC

## 2018-08-13 NOTE — Progress Notes (Signed)
   Subjective:    Patient ID: Alicia Owens, female    DOB: 02/13/1976, 42 y.o.   MRN: [id number]  HPI Patient is here today with complaints of a diverticulitis flare. She has some abd pain that started on Sunday and is not getting any better.No Nausea,nor vomiting, no fevers. She has been taking Tylenol and using ice which neither are helping. Patient with a history of diverticulitis pain in the left lower abdomen.  Denies any nausea vomiting diarrhea.  Denies high fever chills sweats Had previous diverticulitis earlier this year along with a positive CT scan Did not tolerate Cipro Flagyl made her deathly ill ended up in the ER with this she ended up afterwards on Augmentin which seemed to help her she denies fever chills sweats bloody stools currently.  Review of Systems  Constitutional: Negative for activity change, fatigue and fever.  HENT: Negative for congestion and rhinorrhea.   Respiratory: Negative for cough, chest tightness and shortness of breath.   Cardiovascular: Negative for chest pain and leg swelling.  Gastrointestinal: Positive for abdominal pain. Negative for blood in stool, constipation and nausea.  Skin: Negative for color change.  Neurological: Negative for dizziness and headaches.  Psychiatric/Behavioral: Negative for agitation and behavioral problems.       Objective:   Physical Exam  Constitutional: She appears well-nourished. No distress.  HENT:  Head: Normocephalic and atraumatic.  Eyes: Right eye exhibits no discharge. Left eye exhibits no discharge.  Neck: No tracheal deviation present.  Cardiovascular: Normal rate, regular rhythm and normal heart sounds.  No murmur heard. Pulmonary/Chest: Effort normal and breath sounds normal. No respiratory distress.  Abdominal: Soft. There is tenderness.  Musculoskeletal: She exhibits no edema.  Lymphadenopathy:    She has no cervical adenopathy.  Neurological: She is alert. Coordination normal.  Skin: Skin is warm  and dry.  Psychiatric: She has a normal mood and affect. Her behavior is normal.  Vitals reviewed.  She is tender in the left lower quadrant no guarding or rebound       Assessment & Plan:  Presumed diverticulitis Antibiotics discussed Augmentin twice daily for 2 weeks Soft diet next several days Patient is to give us feedback on how she is doing in approximately 3 days If not improving or if doing worse will consider lab work as well as scan I doubt gynecologic issues with the patient We will discuss case with Dr.Rehman because of patient having some intermittent infrequent troubles with diverticulitis Patient had colonoscopy way back in 2011 but also had endoscopy locally

## 2018-08-19 NOTE — Progress Notes (Signed)
Have you spoke with Dr.Rehman regarding this patient or would you like me to get them on the phone? Thank you

## 2018-08-20 NOTE — Progress Notes (Signed)
Called and left message for DR. Rehman to call back

## 2018-08-20 NOTE — Progress Notes (Signed)
Dr. Lorin PicketScott did you ever speak with Dr. Karilyn Cotaehman about Tyler AasHolly Strassner. This message is from one week ago

## 2018-08-20 NOTE — Progress Notes (Signed)
Left message to return call 

## 2018-08-20 NOTE — Progress Notes (Signed)
I did discuss the case with Dr.Rehman they will be connecting with her to bring her in for a colonoscopy the end of January early February then they will discuss with her whether or not they recommend surgical consultation  Please let Jeanice LimHolly know that I talked with him

## 2018-08-20 NOTE — Progress Notes (Signed)
Spoke with patient and informed her that provider had spoke with Dr.Rehman. Pt informed that Dr.Rehman will contact her to set up colonoscopy and discuss if surgery is recommended. Pt states that that the pain will need to get worse before she has surgery.

## 2018-08-20 NOTE — Progress Notes (Signed)
Provider spoke with Dr.Rehman regarding patient. Left message to return call to inform patient. See other message

## 2018-08-22 ENCOUNTER — Telehealth: Payer: Self-pay | Admitting: Family Medicine

## 2018-08-22 ENCOUNTER — Other Ambulatory Visit: Payer: Self-pay | Admitting: Family Medicine

## 2018-08-22 DIAGNOSIS — K5792 Diverticulitis of intestine, part unspecified, without perforation or abscess without bleeding: Secondary | ICD-10-CM

## 2018-08-22 NOTE — Telephone Encounter (Signed)
Please let the patient know that I did speak with Dr.Rehman regarding her reoccurring diverticulitis.  He states that she may benefit from surgery after she gets over this particular bout.  But he cannot know for certain without seeing her and discussing her situation with her.  Therefore go ahead with referral to see him in January.  He recommended that she take her Augmentin twice daily for 2 weeks as prescribed  He also recommended that they see her later in January.  More than likely at that time he will set up a colonoscopy.  Then he will talk with her regarding surgical options. Dr. Henreitta LeberBridges as well as Dr. Lovell SheehanJenkins does surgery for reoccurring diverticulitis locally and [facility] does these procedures in [place].

## 2018-08-22 NOTE — Telephone Encounter (Signed)
Referral placed. Need to clarify what dose. Augmentin 875? Left message to return call

## 2018-08-22 NOTE — Telephone Encounter (Signed)
Patient notified and verbalized understanding. 

## 2018-08-22 NOTE — Telephone Encounter (Signed)
This patient is already using Augmentin 875 mg 1 twice daily it has already been prescribed.  She just needs to continue taking it for the full 2-week prescription

## 2018-09-09 ENCOUNTER — Encounter: Payer: Self-pay | Admitting: Family Medicine

## 2018-09-09 ENCOUNTER — Ambulatory Visit: Payer: 59 | Admitting: Family Medicine

## 2018-09-09 VITALS — BP 122/108 | Temp 98.4°F | Ht 61.0 in | Wt 170.0 lb

## 2018-09-09 DIAGNOSIS — J019 Acute sinusitis, unspecified: Secondary | ICD-10-CM | POA: Diagnosis not present

## 2018-09-09 MED ORDER — AMOXICILLIN-POT CLAVULANATE 875-125 MG PO TABS: 1.0000 | ORAL_TABLET | Freq: Two times a day (BID) | ORAL | 0 refills | Status: DC

## 2018-09-09 MED ORDER — PREDNISONE 20 MG PO TABS: ORAL_TABLET | ORAL | 0 refills | Status: DC

## 2018-09-09 NOTE — Progress Notes (Signed)
   Subjective:    Patient ID: Alicia Owens, female    DOB: [birth year], 43 y.o.   MRN: [id number]  HPI  Patient is here today with complaints of cough,wheezing,runny nose,right ear pain, sore throat on going for the last week.She has been taking Cold calm,elderberry,zicam,oscillo coccinum. Started off with head congestion drainage coughing not feeling good then progressed in the chest congestion been going on for over a week denies high fever chills sweats is having some occasional wheezing PMH benign Review of Systems  Constitutional: Negative for activity change and fever.  HENT: Positive for congestion and rhinorrhea. Negative for ear pain.   Eyes: Negative for discharge.  Respiratory: Positive for cough. Negative for shortness of breath and wheezing.   Cardiovascular: Negative for chest pain.       Objective:   Physical Exam Vitals signs and nursing note reviewed.  Constitutional:      Appearance: She is well-developed.  HENT:     Head: Normocephalic.     Nose: Nose normal.     Mouth/Throat:     Pharynx: No oropharyngeal exudate.  Neck:     Musculoskeletal: Neck supple.  Cardiovascular:     Rate and Rhythm: Normal rate.     Heart sounds: Normal heart sounds. No murmur.  Pulmonary:     Effort: Pulmonary effort is normal.     Breath sounds: Normal breath sounds. No wheezing.  Lymphadenopathy:     Cervical: No cervical adenopathy.  Skin:    General: Skin is warm and dry.           Assessment & Plan:  Viral syndrome Secondary rhinosinusitis Antibiotics prescribed warning signs discussed Bronchitis probably related to recent illness If progressive troubles or worse to follow-up Hold off on prednisone currently and and less symptoms get worse with the wheezing

## 2018-09-10 ENCOUNTER — Telehealth: Payer: Self-pay | Admitting: Family Medicine

## 2018-09-10 NOTE — Telephone Encounter (Signed)
Pt seen 09/09/18 with Dr.Scott for URI, pt was excused from work for 09/09/18, 09/10/18, pt would like to know if she can have a note for 09/11/18. Advise.

## 2018-09-10 NOTE — Telephone Encounter (Signed)
Please give work note as requested 

## 2018-09-11 ENCOUNTER — Encounter: Payer: Self-pay | Admitting: Family Medicine

## 2018-09-11 NOTE — Telephone Encounter (Signed)
Pt aware note is ready for pick up 

## 2018-09-23 ENCOUNTER — Telehealth: Payer: Self-pay | Admitting: Family Medicine

## 2018-09-23 ENCOUNTER — Other Ambulatory Visit: Payer: Self-pay | Admitting: *Deleted

## 2018-09-23 MED ORDER — AMOXICILLIN-POT CLAVULANATE 875-125 MG PO TABS: 1.0000 | ORAL_TABLET | Freq: Two times a day (BID) | ORAL | 0 refills | Status: DC

## 2018-09-23 NOTE — Telephone Encounter (Signed)
Pt states she still has cough/congested no further wheezing, pt would like a refill on amoxicillin-clavulanate (AUGMENTIN) 875-125 MG tablet. Advise.   Pharmacy:  [facility] - [place], [place] - [address]

## 2018-09-23 NOTE — Telephone Encounter (Signed)
Med sent to pharm. Pt notified.  

## 2018-09-23 NOTE — Telephone Encounter (Signed)
I would recommend renewing the Augmentin for 1 additional round, 875 mg twice daily 10 days  Follow-up if ongoing troubles

## 2018-09-23 NOTE — Telephone Encounter (Signed)
Seen on 1/6 and prescribed augmentin. Left message to return call to get more info

## 2018-09-23 NOTE — Telephone Encounter (Signed)
Did get better on augmentin just not completely gone. No wheezing, cough, congestion, no fever. Pt states dr Lorin Picket mentioned she may need another round of antibiotic.

## 2018-10-07 IMAGING — CT CT ABD-PELV W/ CM
2 of 5 series · 16 of 46 positions shown, 18 images · IV contrast (Isovue)
Comparison: June 18, 2009

CLINICAL DATA: Lower abdominal pain and nausea

EXAM:
CT ABDOMEN AND PELVIS WITH CONTRAST
TECHNIQUE: Multidetector CT imaging of the abdomen and pelvis was performed
using the standard protocol following bolus administration of
intravenous contrast.
CONTRAST:  100mL IIX1MD-M66 IOPAMIDOL (IIX1MD-M66) INJECTION 61%

[Series 2: axial st · axial · 0.75mm/px · z∈[-506,-106]mm · 13 of 91 slices shown, 15 images]
[im 6/91  soft-tissue]
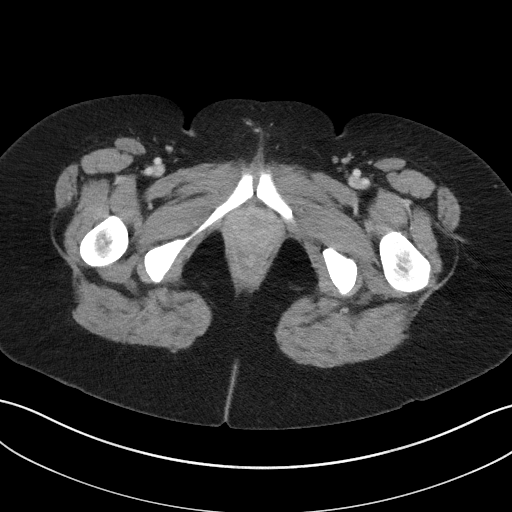
[im 6/91  bone]
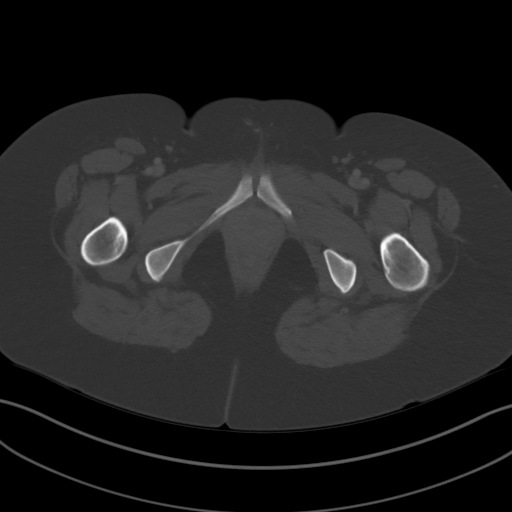
[im 11/91  soft-tissue]
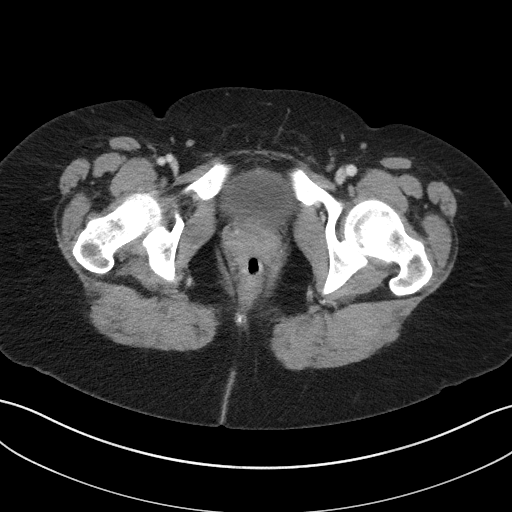
[im 21/91  soft-tissue]
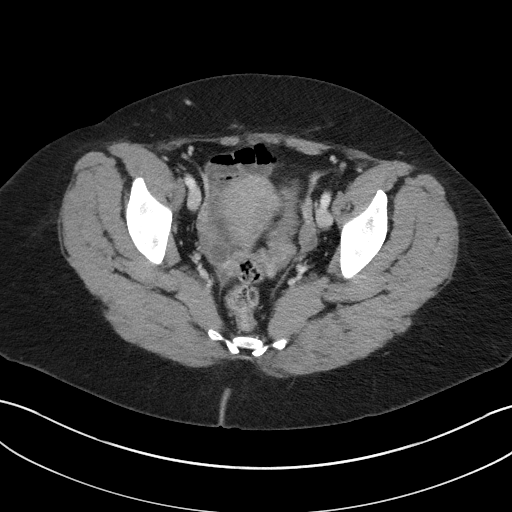
[im 26/91  soft-tissue]
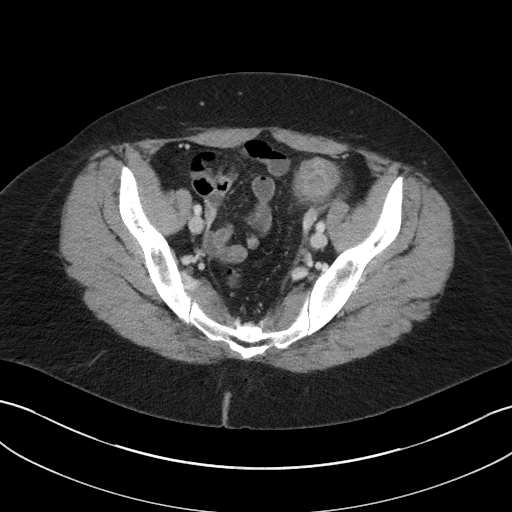
[im 31/91  soft-tissue]
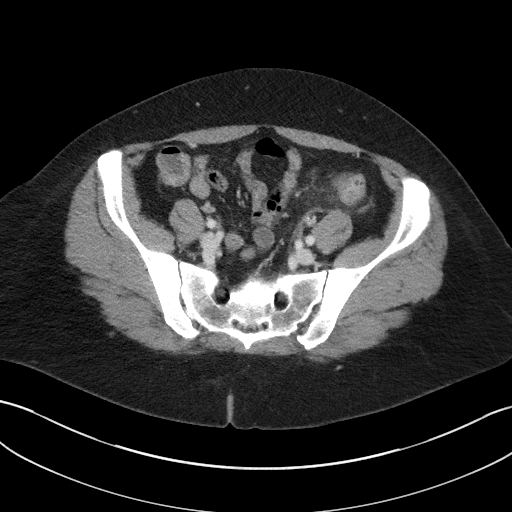
[im 41/91  soft-tissue]
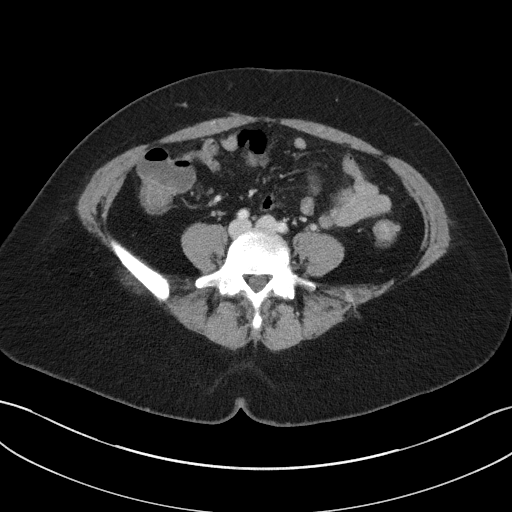
[im 46/91  soft-tissue]
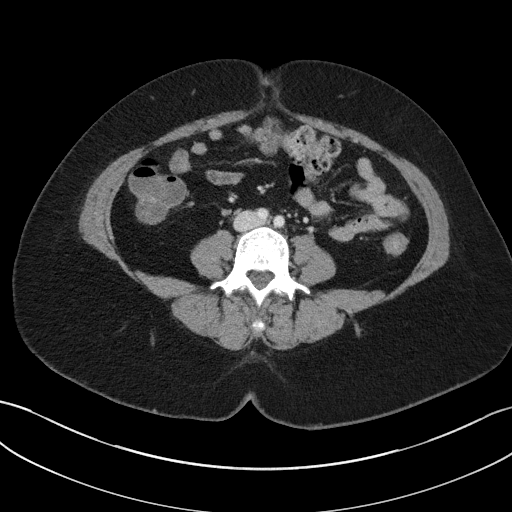
[im 51/91  soft-tissue]
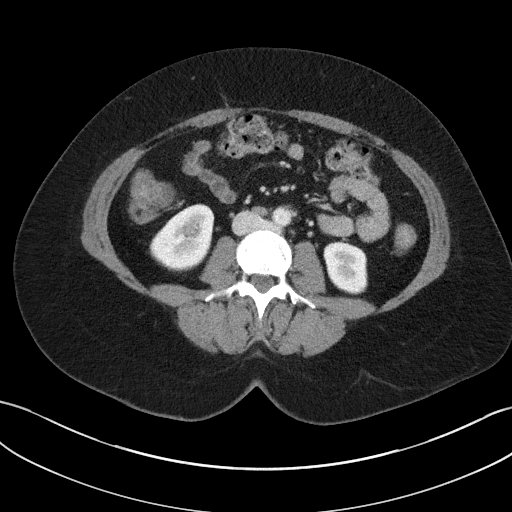
[im 61/91  soft-tissue]
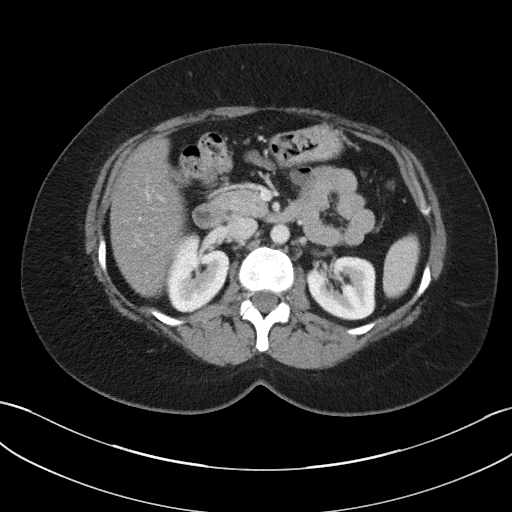
[im 61/91  bone]
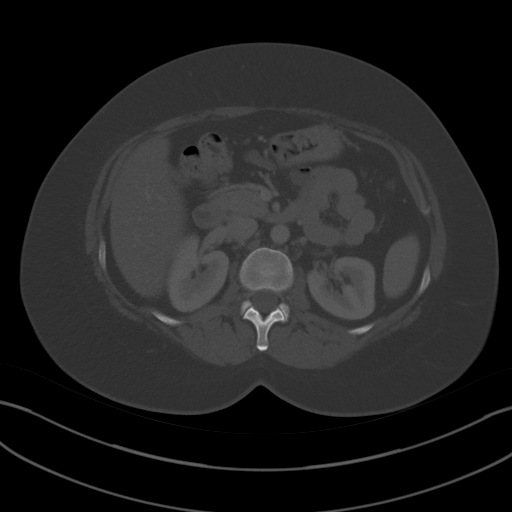
[im 66/91  soft-tissue]
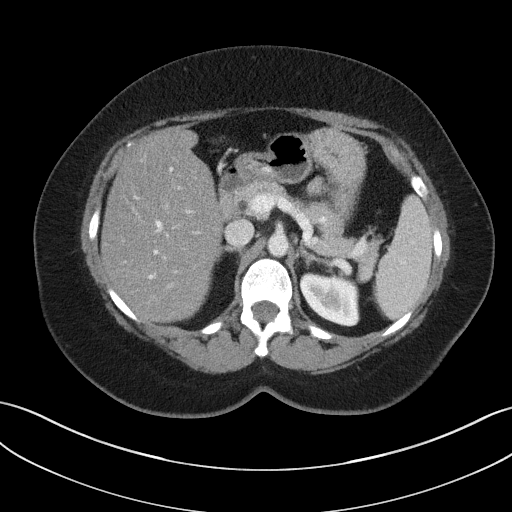
[im 71/91  soft-tissue]
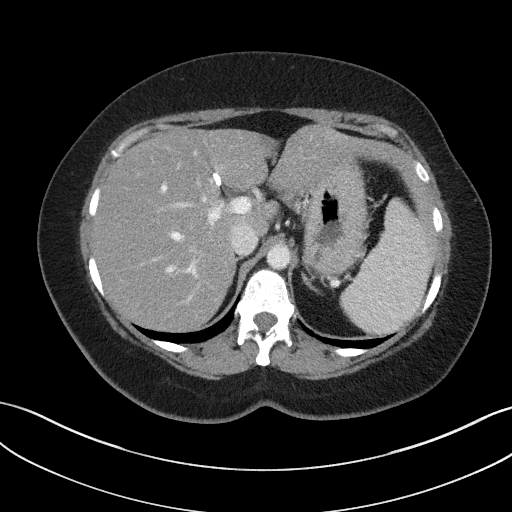
[im 81/91  soft-tissue]
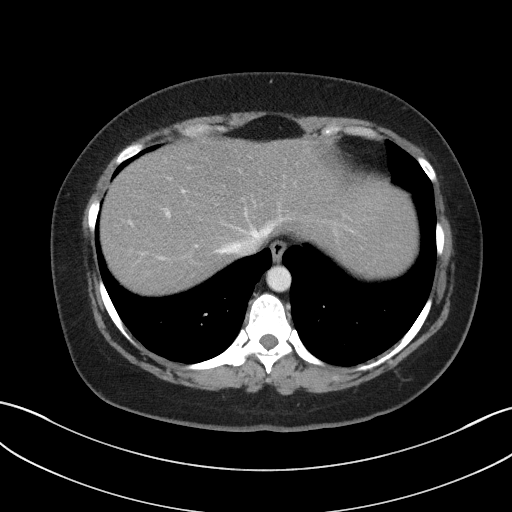
[im 86/91  soft-tissue]
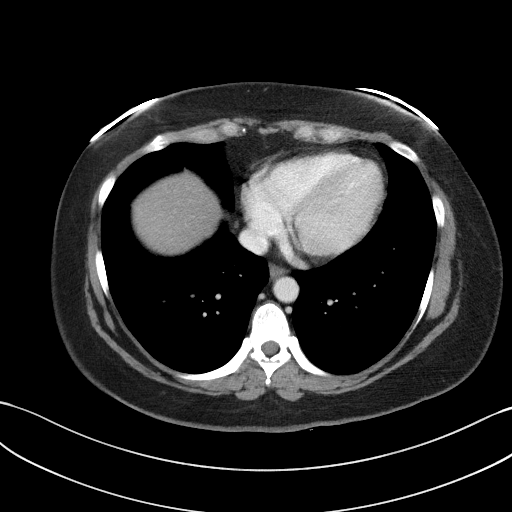

[Series 6: coronal st · coronal · 0.79mm/px · 3 of 101 slices shown]
[im 34/101  soft-tissue]
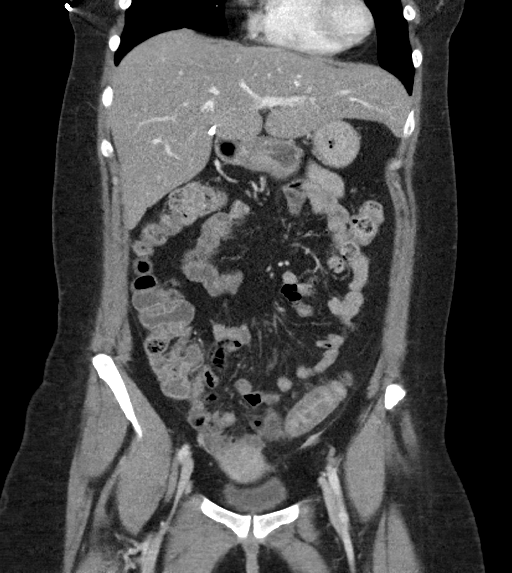
[im 45/101  soft-tissue]
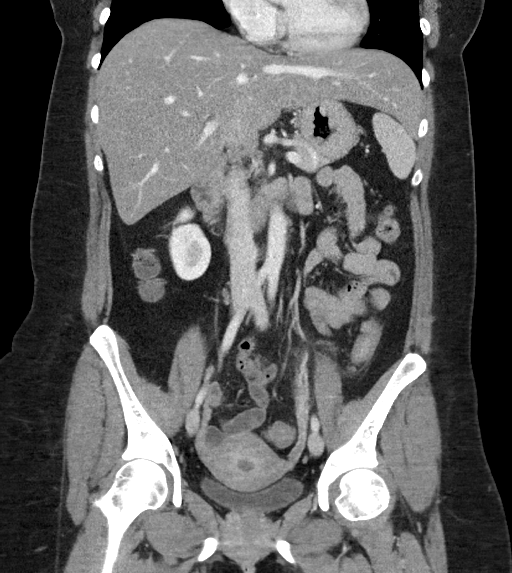
[im 56/101  soft-tissue]
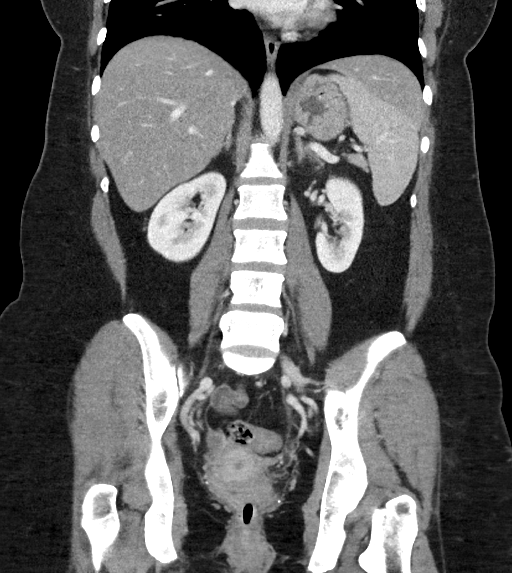

[16 of 46 positions shown; findings below may reference images not displayed]

FINDINGS: Lower chest: Lung bases are clear.

Hepatobiliary: There is hepatic steatosis. No focal liver lesions
are appreciable. Gallbladder is absent. There is no appreciable
biliary duct dilatation.

Pancreas: No pancreatic mass or inflammatory focus.

Spleen: No splenic lesions are evident.

Adrenals/Urinary Tract: Adrenals bilaterally appear normal. Kidneys
bilaterally show no evident mass or hydronephrosis on either side.
There is no evident renal or ureteral calculus on either side.
Urinary bladder is midline with wall thickness within normal limits.

Stomach/Bowel: There is wall thickening in the proximal to mid
sigmoid colon region with surrounding mesenteric inflammation and
minimal fluid. Several diverticula are noted in this area. There is
no abscess or evidence of perforation in this area of
diverticulitis. There are occasional diverticula in the descending
colon without inflammatory change in this area. Elsewhere, no bowel
wall or mesenteric thickening. No evident bowel obstruction. No free
air or portal venous air is evident.

Vascular/Lymphatic: There is no abdominal aortic aneurysm. No
vascular lesions are appreciable beyond minimal calcification in the
aorta. There is a retroaortic left renal vein, an anatomic variant.
Mesenteric arterial vessels are patent. There is no adenopathy in
the abdomen or pelvis.

Reproductive: Uterus is anteverted.  No evident pelvic mass.

Other: Appendix absent. No abscess or ascites evident in the abdomen
or pelvis. There is a small ventral hernia containing only fat.

Musculoskeletal: There are no blastic or lytic bone lesions. No
intramuscular or abdominal wall lesions evident.
IMPRESSION: 1. Diverticulitis involving portions of the proximal mid sigmoid
colon. There is no abscess or perforation in this area.

2. Occasional diverticula in the descending colon without
inflammation.

3. No bowel obstruction. No abscess in the abdomen or pelvis.
Appendix absent.

4.  No renal or ureteral calculi.  No hydronephrosis.

5. Hepatic steatosis without focal liver lesion. Gallbladder absent.

6.  Small ventral hernia containing only fat.

## 2018-10-22 ENCOUNTER — Ambulatory Visit (INDEPENDENT_AMBULATORY_CARE_PROVIDER_SITE_OTHER): Payer: 59 | Admitting: Internal Medicine

## 2018-11-12 ENCOUNTER — Encounter: Payer: Self-pay | Admitting: Family Medicine

## 2018-11-12 ENCOUNTER — Ambulatory Visit: Payer: 59 | Admitting: Family Medicine

## 2018-11-12 VITALS — BP 132/82 | Wt 173.8 lb

## 2018-11-12 DIAGNOSIS — I10 Essential (primary) hypertension: Secondary | ICD-10-CM | POA: Diagnosis not present

## 2018-11-12 DIAGNOSIS — M79661 Pain in right lower leg: Secondary | ICD-10-CM

## 2018-11-12 DIAGNOSIS — E069 Thyroiditis, unspecified: Secondary | ICD-10-CM

## 2018-11-12 DIAGNOSIS — J4530 Mild persistent asthma, uncomplicated: Secondary | ICD-10-CM | POA: Diagnosis not present

## 2018-11-12 DIAGNOSIS — M79662 Pain in left lower leg: Secondary | ICD-10-CM

## 2018-11-12 DIAGNOSIS — R5383 Other fatigue: Secondary | ICD-10-CM

## 2018-11-12 DIAGNOSIS — Z79899 Other long term (current) drug therapy: Secondary | ICD-10-CM

## 2018-11-12 DIAGNOSIS — E7849 Other hyperlipidemia: Secondary | ICD-10-CM

## 2018-11-12 MED ORDER — POTASSIUM CHLORIDE ER 10 MEQ PO TBCR: EXTENDED_RELEASE_TABLET | ORAL | 5 refills | Status: DC

## 2018-11-12 MED ORDER — HYDROCHLOROTHIAZIDE 25 MG PO TABS: 25.0000 mg | ORAL_TABLET | Freq: Every day | ORAL | 4 refills | Status: DC

## 2018-11-12 MED ORDER — BECLOMETHASONE DIPROP HFA 80 MCG/ACT IN AERB: 2.0000 | INHALATION_SPRAY | Freq: Two times a day (BID) | RESPIRATORY_TRACT | 5 refills | Status: DC

## 2018-11-12 MED ORDER — ALPRAZOLAM 0.25 MG PO TABS: ORAL_TABLET | ORAL | 5 refills | Status: DC

## 2018-11-12 MED ORDER — SUMATRIPTAN SUCCINATE 100 MG PO TABS: ORAL_TABLET | ORAL | 5 refills | Status: DC

## 2018-11-12 MED ORDER — PANTOPRAZOLE SODIUM 40 MG PO TBEC: 40.0000 mg | DELAYED_RELEASE_TABLET | Freq: Two times a day (BID) | ORAL | 5 refills | Status: DC

## 2018-11-12 MED ORDER — DICLOFENAC SODIUM 75 MG PO TBEC: 75.0000 mg | DELAYED_RELEASE_TABLET | Freq: Two times a day (BID) | ORAL | 0 refills | Status: DC

## 2018-11-12 MED ORDER — ALBUTEROL SULFATE HFA 108 (90 BASE) MCG/ACT IN AERS: 2.0000 | INHALATION_SPRAY | Freq: Four times a day (QID) | RESPIRATORY_TRACT | 4 refills | Status: DC | PRN

## 2018-11-12 MED ORDER — BUSPIRONE HCL 15 MG PO TABS: ORAL_TABLET | ORAL | 5 refills | Status: DC

## 2018-11-12 MED ORDER — CITALOPRAM HYDROBROMIDE 20 MG PO TABS: 20.0000 mg | ORAL_TABLET | Freq: Every day | ORAL | 5 refills | Status: DC

## 2018-11-12 NOTE — Progress Notes (Signed)
Subjective:    Patient ID: Alicia Owens, female    DOB: June 19, 1976, 43 y.o.   MRN: [id number]  Hypertension  This is a chronic problem. Pertinent negatives include no chest pain or shortness of breath. There are no compliance problems.    Pt here today for 6 month follow up. Pt states she has been having problems with left calf. Left is worse right is also present going on for about 6 weeks. Pt is taking medication as directed.  Ongoing calf discomfort Some aches Pain and discomforcoft even at rest and can be constant Dailydoing fitness trampoline But has stopped doing that pain into the foot Have to move to help during the day Takes tylenol Tried stretches and massage without help occas tingle Ibuprofen helps some  Patient does have ongoing trouble with reflux.  Takes medication on a regular basis.  Tries to minimize foods as best they can.  They understand the importance of dietary compliance.  May also try to avoid eating a large meal close to bedtime.  Patient denies any dysphagia denies hematochezia.  States medicine does a good job keeping the problem under good control.  Without the medication may certainly have issues.They desire to continue taking their medication. Having to use 2 a day to help  Patient does have ongoing trouble with reflux.  Takes medication on a regular basis.  Tries to minimize foods as best they can.  They understand the importance of dietary compliance.  May also try to avoid eating a large meal close to bedtime.  Patient denies any dysphagia denies hematochezia.  States medicine does a good job keeping the problem under good control.  Without the medication may certainly have issues.They desire to continue taking their medication.    Migraines 1 a month has chronic headaches.  Takes Topamax seems to be doing well with that  Stress levels overall doing well denies depression anxiousness under good control  Review of Systems  Constitutional: Negative  for activity change, appetite change and fatigue.  HENT: Negative for congestion and rhinorrhea.   Respiratory: Negative for cough and shortness of breath.   Cardiovascular: Negative for chest pain and leg swelling.  Gastrointestinal: Negative for abdominal pain and diarrhea.  Endocrine: Negative for polydipsia and polyphagia.  Skin: Negative for color change.  Neurological: Negative for dizziness and weakness.  Psychiatric/Behavioral: Negative for behavioral problems and confusion.       Objective:   Physical Exam Vitals signs reviewed.  Constitutional:      General: She is not in acute distress. HENT:     Head: Normocephalic and atraumatic.  Eyes:     General:        Right eye: No discharge.        Left eye: No discharge.  Neck:     Trachea: No tracheal deviation.  Cardiovascular:     Rate and Rhythm: Normal rate and regular rhythm.     Heart sounds: Normal heart sounds. No murmur.  Pulmonary:     Effort: Pulmonary effort is normal. No respiratory distress.     Breath sounds: Normal breath sounds.  Lymphadenopathy:     Cervical: No cervical adenopathy.  Skin:    General: Skin is warm and dry.  Neurological:     Mental Status: She is alert.     Coordination: Coordination normal.  Psychiatric:        Behavior: Behavior normal.           Assessment & Plan:  HTN-  Patient was seen today as part of a visit regarding hypertension. The importance of healthy diet and regular physical activity was discussed. The importance of compliance with medications discussed.  Ideal goal is to keep blood pressure low elevated levels certainly below 140/90 when possible.  The patient was counseled that keeping blood pressure under control lessen his risk of complications.  The importance of regular follow-ups was discussed with the patient.  Low-salt diet such as DASH recommended.  Regular physical activity was recommended as well.  Patient was advised to keep regular  follow-ups.  Bilateral calf pain and discomfort- stretching exercises recommended I doubt blood clot circumference bilateral is normal check d-dimer await results May use anti-inflammatory for up to 2 weeks  Underlying mild persistent reactive airways stable currently continue current measures  Has had problem with thyroid in the past we will check lab work Not having any issues currently  Significant issues in the past with lipid will check lab work  Underlying anxiety and stress overall doing okay continue Celexa Xanax on a as needed basis  Follow-up within 6 months sooner if any problems

## 2018-11-13 LAB — CBC WITH DIFFERENTIAL/PLATELET
BASOS ABS: 0.1 10*3/uL (ref 0.0–0.2)
Basos: 1 %
EOS (ABSOLUTE): 0.5 10*3/uL — AB (ref 0.0–0.4)
Eos: 5 %
Hematocrit: 37.2 % (ref 34.0–46.6)
Hemoglobin: 12.2 g/dL (ref 11.1–15.9)
Immature Grans (Abs): 0 10*3/uL (ref 0.0–0.1)
Immature Granulocytes: 0 %
Lymphocytes Absolute: 3.3 10*3/uL — ABNORMAL HIGH (ref 0.7–3.1)
Lymphs: 35 %
MCH: 27.9 pg (ref 26.6–33.0)
MCHC: 32.8 g/dL (ref 31.5–35.7)
MCV: 85 fL (ref 79–97)
Monocytes Absolute: 0.7 10*3/uL (ref 0.1–0.9)
Monocytes: 7 %
Neutrophils Absolute: 4.8 10*3/uL (ref 1.4–7.0)
Neutrophils: 52 %
Platelets: 431 10*3/uL (ref 150–450)
RBC: 4.38 x10E6/uL (ref 3.77–5.28)
RDW: 13.1 % (ref 11.7–15.4)
WBC: 9.3 10*3/uL (ref 3.4–10.8)

## 2018-11-13 LAB — HEPATIC FUNCTION PANEL
ALT: 24 IU/L (ref 0–32)
AST: 23 IU/L (ref 0–40)
Albumin: 4.3 g/dL (ref 3.8–4.8)
Alkaline Phosphatase: 78 IU/L (ref 39–117)
BILIRUBIN, DIRECT: 0.05 mg/dL (ref 0.00–0.40)
Bilirubin Total: 0.2 mg/dL (ref 0.0–1.2)
Total Protein: 7.1 g/dL (ref 6.0–8.5)

## 2018-11-13 LAB — BASIC METABOLIC PANEL
BUN/Creatinine Ratio: 7 — ABNORMAL LOW (ref 9–23)
BUN: 7 mg/dL (ref 6–24)
CO2: 23 mmol/L (ref 20–29)
Calcium: 9.4 mg/dL (ref 8.7–10.2)
Chloride: 98 mmol/L (ref 96–106)
Creatinine, Ser: 1 mg/dL (ref 0.57–1.00)
GFR calc Af Amer: 80 mL/min/{1.73_m2} (ref >59)
GFR calc non Af Amer: 70 mL/min/{1.73_m2} (ref >59)
Glucose: 105 mg/dL — ABNORMAL HIGH (ref 65–99)
Potassium: 4 mmol/L (ref 3.5–5.2)
Sodium: 141 mmol/L (ref 134–144)

## 2018-11-13 LAB — TSH: TSH: 1.73 u[IU]/mL (ref 0.450–4.500)

## 2018-11-13 LAB — LIPID PANEL
CHOL/HDL RATIO: 4.9 ratio — AB (ref 0.0–4.4)
Cholesterol, Total: 232 mg/dL — ABNORMAL HIGH (ref 100–199)
HDL: 47 mg/dL (ref >39)
LDL Calculated: 147 mg/dL — ABNORMAL HIGH (ref 0–99)
Triglycerides: 192 mg/dL — ABNORMAL HIGH (ref 0–149)
VLDL Cholesterol Cal: 38 mg/dL (ref 5–40)

## 2018-11-13 LAB — D-DIMER, QUANTITATIVE: D-DIMER: <0.2 mg/L FEU (ref 0.00–0.49)

## 2018-11-16 ENCOUNTER — Other Ambulatory Visit: Payer: Self-pay | Admitting: Family Medicine

## 2018-12-04 ENCOUNTER — Other Ambulatory Visit: Payer: Self-pay | Admitting: Family Medicine

## 2018-12-04 NOTE — Telephone Encounter (Signed)
Pt states her inhaler gives her thrush. Pt states provider usually calls in 3 refills. Pt has thrush in mouth. Please advise. Thank you

## 2018-12-07 ENCOUNTER — Encounter: Payer: Self-pay | Admitting: Family Medicine

## 2018-12-09 ENCOUNTER — Other Ambulatory Visit: Payer: Self-pay

## 2018-12-09 ENCOUNTER — Ambulatory Visit (INDEPENDENT_AMBULATORY_CARE_PROVIDER_SITE_OTHER): Payer: 59 | Admitting: Family Medicine

## 2018-12-09 DIAGNOSIS — J4530 Mild persistent asthma, uncomplicated: Secondary | ICD-10-CM | POA: Diagnosis not present

## 2018-12-09 DIAGNOSIS — J301 Allergic rhinitis due to pollen: Secondary | ICD-10-CM

## 2018-12-09 DIAGNOSIS — J019 Acute sinusitis, unspecified: Secondary | ICD-10-CM | POA: Diagnosis not present

## 2018-12-09 MED ORDER — MONTELUKAST SODIUM 10 MG PO TABS: 10.0000 mg | ORAL_TABLET | Freq: Every day | ORAL | 3 refills | Status: DC

## 2018-12-09 MED ORDER — AMOXICILLIN-POT CLAVULANATE 875-125 MG PO TABS: 1.0000 | ORAL_TABLET | Freq: Two times a day (BID) | ORAL | 0 refills | Status: DC

## 2018-12-09 NOTE — Progress Notes (Signed)
   Subjective:    Patient ID: Alicia Owens, female    DOB: 03-02-1976, 43 y.o.   MRN: [id number]  Cough  This is a new problem. The current episode started in the past 7 days. Associated symptoms include nasal congestion. Treatments tried: otc meds.  Patient with significant head congestion drainage coughing Has a lot of chest congestion feels tight in the lungs Intermittent wheezing Denies high fever chills sweats No vomiting or diarrhea No myalgias no flulike illness     Review of Systems  Respiratory: Positive for cough.   Video was obtained patient at her home we were at the office Virtual Visit via Video Note  I connected with Alicia Owens on 12/09/18 at 11:00 AM EDT by a video enabled telemedicine application and verified that I am speaking with the correct person using two identifiers.   I discussed the limitations of evaluation and management by telemedicine and the availability of in person appointments. The patient expressed understanding and agreed to proceed.  History of Present Illness:    Observations/Objective:   Assessment and Plan:   Follow Up Instructions:    I discussed the assessment and treatment plan with the patient. The patient was provided an opportunity to ask questions and all were answered. The patient agreed with the plan and demonstrated an understanding of the instructions.   The patient was advised to call back or seek an in-person evaluation if the symptoms worsen or if the condition fails to improve as anticipated.  I provided 15 minutes of non-face-to-face time during this encounter.        Objective:   Physical Exam  Patient was talked to be a video      Assessment & Plan:  Probable rhinosinusitis antibiotic prescribed Allergy component Add Singulair caution regarding depression caution if it starts making her feel sad blue or suicidal immediately stop medicine Patient to give [place] update within the next 2 weeks how she is  doing preferably by the end of this week Bump up Qvar to 3 puffs twice daily for the next 1 to 2 weeks Hold off on prednisone currently Follow-up if progressive troubles

## 2019-03-14 ENCOUNTER — Other Ambulatory Visit: Payer: Self-pay | Admitting: Family Medicine

## 2019-04-15 ENCOUNTER — Other Ambulatory Visit: Payer: Self-pay | Admitting: Family Medicine

## 2019-04-16 NOTE — Telephone Encounter (Signed)
Patient may have 30-day refill of each with 1 additional refill needs follow-up visit in September

## 2019-04-18 ENCOUNTER — Ambulatory Visit (INDEPENDENT_AMBULATORY_CARE_PROVIDER_SITE_OTHER): Payer: 59 | Admitting: Family Medicine

## 2019-04-18 ENCOUNTER — Other Ambulatory Visit: Payer: Self-pay

## 2019-04-18 DIAGNOSIS — J019 Acute sinusitis, unspecified: Secondary | ICD-10-CM | POA: Diagnosis not present

## 2019-04-18 MED ORDER — AMOXICILLIN 500 MG PO TABS: 500.0000 mg | ORAL_TABLET | Freq: Three times a day (TID) | ORAL | 0 refills | Status: DC

## 2019-04-18 NOTE — Progress Notes (Signed)
   Subjective:    Patient ID: Alicia Owens, female    DOB: 1976/07/01, 43 y.o.   MRN: [id number]  Otalgia  There is pain in the left ear. The current episode started 1 to 4 weeks ago. Pertinent negatives include no coughing or rhinorrhea. She has tried acetaminophen for the symptoms.   Patient with ear pain along with left anterior neck pain off and on over the past 3 weeks relates feeling pressure in the ears as well as popping in the ears also some slight postnasal drainage no wheezing or difficulty breathing evaluation via video could not be definitive so therefore patient was brought to the office ears were looked at throat looked at lungs listened to see below 15 minutes was spent with patient today discussing healthcare issues which they came.  More than 50% of this visit-total duration of visit-was spent in counseling and coordination of care.  Please see diagnosis regarding the focus of this coordination and care  PMH benign Review of Systems  Constitutional: Negative for activity change and fever.  HENT: Positive for ear pain. Negative for congestion, postnasal drip and rhinorrhea.   Eyes: Negative for discharge.  Respiratory: Negative for cough, shortness of breath and wheezing.   Cardiovascular: Negative for chest pain.   Virtual Visit via Video Note  I connected with [name] on 04/18/19 at  4:10 PM EDT by a video enabled telemedicine application and verified that I am speaking with the correct person using two identifiers.  Location: Patient: home Provider: office   I discussed the limitations of evaluation and management by telemedicine and the availability of in person appointments. The patient expressed understanding and agreed to proceed.  History of Present Illness:    Observations/Objective:   Assessment and Plan:   Follow Up Instructions:    I discussed the assessment and treatment plan with the patient. The patient was provided an opportunity to ask  questions and all were answered. The patient agreed with the plan and demonstrated an understanding of the instructions.   The patient was advised to call back or seek an in-person evaluation if the symptoms worsen or if the condition fails to improve as anticipated.  I provided 16 minutes of non-face-to-face time during this encounter.        Objective:   Physical Exam Eardrums are normal bilateral throat normal anterior aspect left side slightly tender but no masses felt lungs are clear respiratory rate normal heart is regular no murmurs       Assessment & Plan:  Left side ear pain along with left anterior cervical adenopathy tenderness but no enlargement of the lymph nodes no mass patient also with some sinus symptoms therefore we will go ahead and treat with an antibiotic warning signs discussed in detail to follow-up if persistent trouble no need for ENT consultation currently

## 2019-04-18 NOTE — Progress Notes (Deleted)
   Subjective:    Patient ID: Alicia Owens, female    DOB: 1975/12/01, 43 y.o.   MRN: [id number]  HPI    Review of Systems     Objective:   Physical Exam        Assessment & Plan:

## 2019-05-17 ENCOUNTER — Other Ambulatory Visit: Payer: Self-pay | Admitting: Family Medicine

## 2019-05-20 ENCOUNTER — Other Ambulatory Visit: Payer: Self-pay

## 2019-05-20 ENCOUNTER — Ambulatory Visit (INDEPENDENT_AMBULATORY_CARE_PROVIDER_SITE_OTHER): Payer: 59 | Admitting: Family Medicine

## 2019-05-20 VITALS — BP 126/74

## 2019-05-20 DIAGNOSIS — I10 Essential (primary) hypertension: Secondary | ICD-10-CM

## 2019-05-20 DIAGNOSIS — G43009 Migraine without aura, not intractable, without status migrainosus: Secondary | ICD-10-CM | POA: Diagnosis not present

## 2019-05-20 DIAGNOSIS — R5383 Other fatigue: Secondary | ICD-10-CM

## 2019-05-20 DIAGNOSIS — J4520 Mild intermittent asthma, uncomplicated: Secondary | ICD-10-CM

## 2019-05-20 DIAGNOSIS — R748 Abnormal levels of other serum enzymes: Secondary | ICD-10-CM

## 2019-05-20 DIAGNOSIS — G4700 Insomnia, unspecified: Secondary | ICD-10-CM | POA: Diagnosis not present

## 2019-05-20 DIAGNOSIS — E785 Hyperlipidemia, unspecified: Secondary | ICD-10-CM

## 2019-05-20 MED ORDER — TOPIRAMATE 50 MG PO TABS: ORAL_TABLET | ORAL | 5 refills | Status: DC

## 2019-05-20 MED ORDER — HYDROCHLOROTHIAZIDE 25 MG PO TABS: ORAL_TABLET | ORAL | 6 refills | Status: DC

## 2019-05-20 MED ORDER — ALPRAZOLAM 0.25 MG PO TABS: ORAL_TABLET | ORAL | 5 refills | Status: DC

## 2019-05-20 MED ORDER — ALBUTEROL SULFATE HFA 108 (90 BASE) MCG/ACT IN AERS: 2.0000 | INHALATION_SPRAY | Freq: Four times a day (QID) | RESPIRATORY_TRACT | 5 refills | Status: DC | PRN

## 2019-05-20 MED ORDER — SUMATRIPTAN SUCCINATE 100 MG PO TABS: ORAL_TABLET | ORAL | 5 refills | Status: DC

## 2019-05-20 MED ORDER — PANTOPRAZOLE SODIUM 40 MG PO TBEC: 40.0000 mg | DELAYED_RELEASE_TABLET | Freq: Two times a day (BID) | ORAL | 5 refills | Status: DC

## 2019-05-20 MED ORDER — BUSPIRONE HCL 15 MG PO TABS: ORAL_TABLET | ORAL | 5 refills | Status: DC

## 2019-05-20 MED ORDER — QVAR REDIHALER 80 MCG/ACT IN AERB: 2.0000 | INHALATION_SPRAY | Freq: Two times a day (BID) | RESPIRATORY_TRACT | 5 refills | Status: DC

## 2019-05-20 MED ORDER — MONTELUKAST SODIUM 10 MG PO TABS: ORAL_TABLET | ORAL | 5 refills | Status: DC

## 2019-05-20 MED ORDER — CITALOPRAM HYDROBROMIDE 20 MG PO TABS: 20.0000 mg | ORAL_TABLET | Freq: Every day | ORAL | 5 refills | Status: DC

## 2019-05-20 NOTE — Progress Notes (Signed)
Subjective:    Patient ID: Alicia Owens, female    DOB: 03/06/1976, 43 y.o.   MRN: [id number]  Hypertension This is a chronic problem. Pertinent negatives include no chest pain or shortness of breath. Treatments tried: hctz.  pt states bp has been running good. Has not checked today.  But previously it was very good at 126/74  She is working from home trying to avoid COVID she is helping her children with virtual school under some stress but states depression and anxiety under good control with medicines GAD 7 and phq9 done   Office Visit from 05/20/2019 in [facility]  PHQ-9 Total Score  2      GAD 7 : Generalized Anxiety Score 05/20/2019  Nervous, Anxious, on Edge 0  Control/stop worrying 0  Worry too much - different things 0  Trouble relaxing 0  Restless 0  Easily annoyed or irritable 0  Afraid - awful might happen 0  Total GAD 7 Score 0  Anxiety Difficulty Not difficult at all     Wanting to get her thyroid checked again.  She states at times she is having fatigue tiredness.  She wonders if this could be related to her thyroid.  Trouble sleeping.  She does states she has difficult time falling asleep and staying asleep we did discuss sleep hygiene and ways to help control that  Patient's asthma under good control using her regular medicines on a regular basis also relates takes her allergy medicine on a regular basis has not had any flareups of the asthma  Does have reflux it is necessary for her to take PPI when she tries to skip she has problems she would like to continue this  Patient also having the headaches a little more often in terms of severity than she used to about 4/month she would like to try a higher dose of the Topamax to see if it helps she relates compliance with her medicines but she does try to avoid triggers  Virtual Visit via Telephone Note  I connected with Alicia FiguresHolly B Colasanti on 05/20/19 at  8:40 AM EDT by telephone and verified that I  am speaking with the correct person using two identifiers.  Location: Patient: home Provider: office   I discussed the limitations, risks, security and privacy concerns of performing an evaluation and management service by telephone and the availability of in person appointments. I also discussed with the patient that there may be a patient responsible charge related to this service. The patient expressed understanding and agreed to proceed.   History of Present Illness:    Observations/Objective:   Assessment and Plan:   Follow Up Instructions:    I discussed the assessment and treatment plan with the patient. The patient was provided an opportunity to ask questions and all were answered. The patient agreed with the plan and demonstrated an understanding of the instructions.   The patient was advised to call back or seek an in-person evaluation if the symptoms worsen or if the condition fails to improve as anticipated.  I provided 25 minutes of non-face-to-face time during this encounter.      Review of Systems  Constitutional: Negative for activity change, appetite change and fatigue.  HENT: Negative for congestion and rhinorrhea.   Respiratory: Negative for cough and shortness of breath.   Cardiovascular: Negative for chest pain and leg swelling.  Gastrointestinal: Negative for abdominal pain and diarrhea.  Endocrine: Negative for polydipsia and polyphagia.  Skin: Negative for  color change.  Neurological: Negative for dizziness and weakness.  Psychiatric/Behavioral: Negative for behavioral problems and confusion.       Objective:   Physical Exam Today's visit was via telephone Physical exam was not possible for this visit        Assessment & Plan:  1. Insomnia, unspecified type I recommend sleep hygiene.  May use melatonin if necessary also uses Xanax intermittently but not on a regular basis  2. Essential hypertension Blood pressure good control takes  medication watch his diet stays active  3. Migraine without aura and without status migrainosus, not intractable Migraine flareup increase Topamax patient will give [place] feedback in a month on how this is doing  4. Mild intermittent asthma without complication Asthma overall stable.  Continue current measures and allergy measures  5. Hyperlipidemia, unspecified hyperlipidemia type Hyperlipidemia watch diet closely.  No need for medication currently but will check new lab work old lab work reviewed - Lipid panel  6. Other fatigue Fatigue tiredness could be thyroid check lab work await results - Hepatic function panel - TSH - T4, free - VITAMIN D 25 Hydroxy (Vit-D Deficiency, Fractures)  7. Elevated liver enzymes History elevated liver enzymes will recheck liver functions - Hepatic function panel Follow-up 6 months  25 minutes was spent with the patient.  This statement verifies that 25 minutes was indeed spent with the patient.  More than 50% of this visit-total duration of the visit-was spent in counseling and coordination of care. The issues that the patient came in for today as reflected in the diagnosis (s) please refer to documentation for further details.

## 2019-05-20 NOTE — Telephone Encounter (Signed)
Patient not currently taking potassium therefore refill this currently, the Topamax was sent in today with a new prescription and directions thank you

## 2019-05-21 ENCOUNTER — Other Ambulatory Visit: Payer: Self-pay | Admitting: Family Medicine

## 2019-05-22 ENCOUNTER — Telehealth: Payer: Self-pay | Admitting: *Deleted

## 2019-05-22 NOTE — Telephone Encounter (Signed)
Received notification from pharmacy that patient's insurance will not cover Qvar Redihaler 64mcg/act. It states covered alternative is symbicort 160-4.5  Please advise

## 2019-05-22 NOTE — Telephone Encounter (Signed)
Unfortunately Symbicort is a combination inhaler of a steroid as well as a long-acting bronchodilator so therefore it is not the same classification as Qvar Please find out will her insurance cover Flovent?

## 2019-05-23 NOTE — Telephone Encounter (Signed)
Flovent 110 is also covered per pharmacy

## 2019-05-25 NOTE — Telephone Encounter (Signed)
It is important for the patient to know Qvar not covered Flovent is very similar 220 Flovent 2 puffs twice daily, 1 inhaler with 5 refills

## 2019-05-26 MED ORDER — FLOVENT HFA 220 MCG/ACT IN AERO: INHALATION_SPRAY | RESPIRATORY_TRACT | 5 refills | Status: DC

## 2019-05-26 NOTE — Telephone Encounter (Signed)
Medication sent to [facility]. Left message to return call

## 2019-05-27 ENCOUNTER — Telehealth: Payer: Self-pay | Admitting: Family Medicine

## 2019-05-27 MED ORDER — OMEPRAZOLE 20 MG PO CPDR: DELAYED_RELEASE_CAPSULE | ORAL | 5 refills | Status: DC

## 2019-05-27 NOTE — Telephone Encounter (Signed)
Please advise. Thank you

## 2019-05-27 NOTE — Telephone Encounter (Signed)
Please go ahead with omeprazole 20 mg 1 taken twice daily, #60, 5 refills, cancel Protonix, put on the comments to the pharmacist to cancel Protonix thank you

## 2019-05-27 NOTE — Telephone Encounter (Signed)
Pt contacted and verbalized understanding.  

## 2019-05-27 NOTE — Telephone Encounter (Signed)
Patient would like to switch back to omeprazole 20 mg twice a day instead of protonix 40 mg. She states her insurance will pay for it now and the protonix not helping as well as the other. [place]

## 2019-05-27 NOTE — Telephone Encounter (Signed)
Pt contacted and verbalized understanding. Medication sent to pharmacy  

## 2019-05-30 ENCOUNTER — Other Ambulatory Visit: Payer: Self-pay

## 2019-05-30 ENCOUNTER — Ambulatory Visit (INDEPENDENT_AMBULATORY_CARE_PROVIDER_SITE_OTHER): Payer: 59 | Admitting: Nurse Practitioner

## 2019-05-30 DIAGNOSIS — N390 Urinary tract infection, site not specified: Secondary | ICD-10-CM | POA: Diagnosis not present

## 2019-05-30 MED ORDER — CIPROFLOXACIN HCL 500 MG PO TABS: 500.0000 mg | ORAL_TABLET | Freq: Two times a day (BID) | ORAL | 0 refills | Status: DC

## 2019-05-30 NOTE — Progress Notes (Signed)
VIDEO VISIT  Subjective:    Patient ID: Alicia Owens, female    DOB: 12/13/1975, 43 y.o.   MRN: [id number] Nurse note: Abdominal Pain Associated symptoms comments: Had a UTI a couple weeks ago, did televisit and was prescribed Cipro for 5 days. Sunday night-Monday morning began to feel bad; no issues that night going to bathroom. Left upper leg pain and abdominal pain(under right breast) lower back pain. As the week went on, pt did not feel good; no energy at all. No chills, no fever. Pt keeps test strips at home; nitrite negative; leukocytes positive. Small amount of nausea yesterday. She has tried acetaminophen for the symptoms. The treatment provided mild relief.   Virtual Visit via Video Note  I connected with Crissie Figures on 05/30/19 at  3:40 PM EDT by a video enabled telemedicine application and verified that I am speaking with the correct person using two identifiers.  Location: Patient: home Provider: office   I discussed the limitations of evaluation and management by telemedicine and the availability of in person appointments. The patient expressed understanding and agreed to proceed.  History of Present Illness: Presents for complaints of slight burning with urination and low back pain that began 5 days ago.  Mainly some mild pain in the groin area and anterior thighs.  Was treated early September by virtual visit for UTI from a provider from her employer.  Was given Cipro 5 days therapy.  States she normally takes 10 days.  Symptoms improved until a few days ago.  No fever chills.  No nausea vomiting.  Slight burning with urination.  Urgency and frequency.  Was having some pain along the costal margin on the right side into the epigastric area for about 24 to 40 hours but this has greatly improved.  No flank pain.  No pelvic pain.  No vaginal discharge.  Tubal ligation for birth control.  Same sexual partner.  Generalized malaise.  Patient had some urine test strips, showed nitrite  negative and leukocyte positive.   Observations/Objective: Format  Patient present at home Provider present at office Consent for interaction obtained Coronavirus outbreak made virtual visit necessary  NAD.  Alert, oriented.  Thoughts logical coherent and relevant.  Assessment and Plan: Problem List Items Addressed This Visit    None    Visit Diagnoses    UTI (urinary tract infection), uncomplicated    -  Primary     Meds ordered this encounter  Medications   ciprofloxacin (CIPRO) 500 MG tablet    Sig: Take 1 tablet (500 mg total) by mouth 2 (two) times daily.    Dispense:  20 tablet    Refill:  0    Patient has history of N/V with med but took it without difficulty a few weeks ago. Thanks.    Order Specific Question:   Supervising Provider    Answer:   Lilyan Punt A [[id number]]     Follow Up Instructions: Repeat Cipro for 10 days. Increase fluid intake. Use AZO OTC as directed for 48 hours then discontinue. Call back in 72 hours if no improvement in symptoms, call or go to urgent care sooner if worse.  Warning signs reviewed.   I discussed the assessment and treatment plan with the patient. The patient was provided an opportunity to ask questions and all were answered. The patient agreed with the plan and demonstrated an understanding of the instructions.   The patient was advised to call back or seek an in-person evaluation  if the symptoms worsen or if the condition fails to improve as anticipated.  I provided 15 minutes of non-face-to-face time during this encounter.   Vicente Males, LPN    Review of Systems  Gastrointestinal: Positive for abdominal pain.       Objective:   Physical Exam        Assessment & Plan:

## 2019-05-31 ENCOUNTER — Encounter: Payer: Self-pay | Admitting: Nurse Practitioner

## 2019-06-04 LAB — T4, FREE: Free T4: 1.09 ng/dL (ref 0.82–1.77)

## 2019-06-04 LAB — HEPATIC FUNCTION PANEL
ALT: 29 IU/L (ref 0–32)
AST: 25 IU/L (ref 0–40)
Albumin: 4.5 g/dL (ref 3.8–4.8)
Alkaline Phosphatase: 88 IU/L (ref 39–117)
Bilirubin Total: 0.3 mg/dL (ref 0.0–1.2)
Bilirubin, Direct: 0.12 mg/dL (ref 0.00–0.40)
Total Protein: 7.8 g/dL (ref 6.0–8.5)

## 2019-06-04 LAB — VITAMIN D 25 HYDROXY (VIT D DEFICIENCY, FRACTURES): Vit D, 25-Hydroxy: 35.7 ng/mL (ref 30.0–100.0)

## 2019-06-04 LAB — LIPID PANEL
Chol/HDL Ratio: 4.6 ratio — ABNORMAL HIGH (ref 0.0–4.4)
Cholesterol, Total: 203 mg/dL — ABNORMAL HIGH (ref 100–199)
HDL: 44 mg/dL (ref >39)
LDL Chol Calc (NIH): 128 mg/dL — ABNORMAL HIGH (ref 0–99)
Triglycerides: 172 mg/dL — ABNORMAL HIGH (ref 0–149)
VLDL Cholesterol Cal: 31 mg/dL (ref 5–40)

## 2019-06-04 LAB — TSH: TSH: 0.754 u[IU]/mL (ref 0.450–4.500)

## 2019-06-07 ENCOUNTER — Other Ambulatory Visit (INDEPENDENT_AMBULATORY_CARE_PROVIDER_SITE_OTHER): Payer: 59 | Admitting: *Deleted

## 2019-06-07 DIAGNOSIS — Z23 Encounter for immunization: Secondary | ICD-10-CM | POA: Diagnosis not present

## 2019-08-11 ENCOUNTER — Other Ambulatory Visit: Payer: Self-pay

## 2019-08-11 ENCOUNTER — Ambulatory Visit (INDEPENDENT_AMBULATORY_CARE_PROVIDER_SITE_OTHER): Payer: 59 | Admitting: Family Medicine

## 2019-08-11 VITALS — Temp 97.0°F

## 2019-08-11 DIAGNOSIS — R1013 Epigastric pain: Secondary | ICD-10-CM | POA: Diagnosis not present

## 2019-08-11 DIAGNOSIS — R0781 Pleurodynia: Secondary | ICD-10-CM

## 2019-08-11 MED ORDER — NAPROXEN 500 MG PO TABS: 500.0000 mg | ORAL_TABLET | Freq: Two times a day (BID) | ORAL | 0 refills | Status: DC

## 2019-08-11 NOTE — Progress Notes (Signed)
   Subjective:    Patient ID: Alicia Owens, female    DOB: [birth year], 43 y.o.   MRN: [id number] Initially was a virtual visit but then was brought in in order to be examined Chest Pain  This is a new problem. Episode onset: 3 days. Quality: pain is a tightness and is tender. The pain does not radiate. Pertinent negatives include no abdominal pain, cough, dizziness, shortness of breath or weakness. The pain is aggravated by nothing. She has tried acetaminophen for the symptoms. The treatment provided mild relief.  Virtual Visit via Video Note She relates a lot of discomfort in the left lower ribs on the anterior aspect there is no rashes no fever she has had some bronchial cough related to her asthma over the past few weeks no wheezing or difficulty breathing.  No nausea vomiting or heartburn.  Tylenol helps no particular position makes it better or worse.  I connected with [name] on 08/11/19 at  1:10 PM EST by a video enabled telemedicine application and verified that I am speaking with the correct person using two identifiers.  Location: Patient: home Provider: office   I discussed the limitations of evaluation and management by telemedicine and the availability of in person appointments. The patient expressed understanding and agreed to proceed.  History of Present Illness:    Observations/Objective:   Assessment and Plan:   Follow Up Instructions:    I discussed the assessment and treatment plan with the patient. The patient was provided an opportunity to ask questions and all were answered. The patient agreed with the plan and demonstrated an understanding of the instructions.   The patient was advised to call back or seek an in-person evaluation if the symptoms worsen or if the condition fails to improve as anticipated.  I provided 17 minutes of non-face-to-face time during this encounter.      Review of Systems  Constitutional: Negative for activity change,  appetite change and fatigue.  HENT: Negative for congestion and rhinorrhea.   Respiratory: Negative for cough and shortness of breath.   Cardiovascular: Positive for chest pain. Negative for leg swelling.  Gastrointestinal: Negative for abdominal pain and diarrhea.  Endocrine: Negative for polydipsia and polyphagia.  Skin: Negative for color change.  Neurological: Negative for dizziness and weakness.  Psychiatric/Behavioral: Negative for behavioral problems and confusion.   She denies any substernal chest tightness pressure pain or shortness of breath    Objective:   Physical Exam  Patient had virtual visit Appears to be in no distress Atraumatic Neuro able to relate and oriented No apparent resp distress Color normal  The patient's upper abdomen has some slight tenderness in the epigastric region.  Also patient has rib tenderness bilateral worse on the left than the right patient states rib tenderness is what she has been feeling     Assessment & Plan:  More than likely this is musculoskeletal rib pain Anti-inflammatory over the next 7 to 10 days as needed Continue GI meds If not showing improvement over the next few days to notify us Hold off on any x-rays lab work currently.

## 2019-08-17 ENCOUNTER — Encounter: Payer: Self-pay | Admitting: Family Medicine

## 2019-08-18 MED ORDER — PREDNISONE 20 MG PO TABS: ORAL_TABLET | ORAL | 0 refills | Status: DC

## 2019-08-18 MED ORDER — SUCRALFATE 1 G PO TABS: ORAL_TABLET | ORAL | 0 refills | Status: DC

## 2019-08-18 NOTE — Addendum Note (Signed)
Addended by: Vicente Males on: 08/18/2019 01:34 PM   Modules accepted: Orders

## 2019-08-18 NOTE — Telephone Encounter (Signed)
Contacted patient and informed her to continue omeprazole, add Carafate and Prednisone. Stop anti inflammatory. Pt advised to call us if things do not get better. Medications sent to pharmacy. Pt verbalized understanding.

## 2019-08-18 NOTE — Telephone Encounter (Signed)
Nurses It is possible this could be reflux related and is possible it could also be just increased inflammation. I would recommend the following Continue the omeprazole as currently dosed Add Carafate 1 g tablet crush and dissolve into 30 cc of water and take with meals, #90 Do this over the next couple weeks Also stop anti-inflammatory May do prednisone 20 mg, 2 daily for the next 5 days If progressive troubles we will need to do a follow-up visit.  Sooner if any problems.

## 2019-09-09 ENCOUNTER — Other Ambulatory Visit: Payer: Self-pay | Admitting: Family Medicine

## 2019-09-09 NOTE — Telephone Encounter (Signed)
Last med check 05/20/19 

## 2019-09-22 ENCOUNTER — Other Ambulatory Visit: Payer: Self-pay | Admitting: Family Medicine

## 2019-09-22 ENCOUNTER — Other Ambulatory Visit: Payer: Self-pay

## 2019-09-22 ENCOUNTER — Encounter: Payer: Self-pay | Admitting: Family Medicine

## 2019-09-22 ENCOUNTER — Ambulatory Visit (INDEPENDENT_AMBULATORY_CARE_PROVIDER_SITE_OTHER): Payer: 59 | Admitting: Family Medicine

## 2019-09-22 VITALS — BP 122/80 | Temp 97.6°F | Ht 61.0 in | Wt 168.8 lb

## 2019-09-22 DIAGNOSIS — D509 Iron deficiency anemia, unspecified: Secondary | ICD-10-CM

## 2019-09-22 DIAGNOSIS — M5382 Other specified dorsopathies, cervical region: Secondary | ICD-10-CM | POA: Diagnosis not present

## 2019-09-22 DIAGNOSIS — M629 Disorder of muscle, unspecified: Secondary | ICD-10-CM

## 2019-09-22 NOTE — Progress Notes (Signed)
   Subjective:    Patient ID: Alicia Owens, female    DOB: 1976-04-21, 44 y.o.   MRN: [id number]  HPIcyst on back. Has been there for a long time. Pt states it is starting to hurt.  She states she is notices that it is becoming more sore and tender.  She states her husband checks it on a regular basis and is told her that he does not find any evidence that skin bigger.  Patient denies any injury to it.  Denies numbness or tingling down the arm. Recheck lungs. States she had lung inflammation before christmas.  Recently her breathing has been going good.  She is at a higher risk of lung related issues and we did discuss Covid vaccine today   Review of Systems  Constitutional: Negative for activity change and appetite change.  HENT: Negative for congestion and rhinorrhea.   Respiratory: Negative for cough and shortness of breath.   Cardiovascular: Negative for chest pain and leg swelling.  Gastrointestinal: Negative for abdominal pain, nausea and vomiting.  Skin: Negative for color change.  Neurological: Negative for dizziness and weakness.  Psychiatric/Behavioral: Negative for agitation and confusion.       Objective:   Physical Exam Upper trapezius on the right side is tender to the touch I do not find any evidence of swelling I do not find any evidence of a cyst or nodule.  Patient does have some mild kyphosis.  Her strength in her arms is good.  Reflexes good. Lungs clear respiratory rate normal heart regular no murmurs  Thorough neck exam reveals no adenopathy      Assessment & Plan:  Reactive airway stable currently Covid vaccine recommended when available The area on her right upper trapezius I believe that this is more of the inflammation in the muscle hopefully with massage and stretching it will get better if she starts having symptoms down the arm may need to do x-rays of the neck I would not recommend CT or MRI currently. Patient to give [place] follow-up in 3 to 4 weeks how  this is doing

## 2019-09-23 LAB — IRON AND TIBC
Iron Saturation: 10 % — ABNORMAL LOW (ref 15–55)
Iron: 49 ug/dL (ref 27–159)
Total Iron Binding Capacity: 492 ug/dL — ABNORMAL HIGH (ref 250–450)
UIBC: 443 ug/dL — ABNORMAL HIGH (ref 131–425)

## 2019-09-23 LAB — CBC WITH DIFFERENTIAL/PLATELET
Basophils Absolute: 0.1 10*3/uL (ref 0.0–0.2)
Basos: 1 %
EOS (ABSOLUTE): 0.3 10*3/uL (ref 0.0–0.4)
Eos: 3 %
Hematocrit: 39.6 % (ref 34.0–46.6)
Hemoglobin: 12.9 g/dL (ref 11.1–15.9)
Immature Grans (Abs): 0 10*3/uL (ref 0.0–0.1)
Immature Granulocytes: 0 %
Lymphocytes Absolute: 2.8 10*3/uL (ref 0.7–3.1)
Lymphs: 28 %
MCH: 27.5 pg (ref 26.6–33.0)
MCHC: 32.6 g/dL (ref 31.5–35.7)
MCV: 84 fL (ref 79–97)
Monocytes Absolute: 0.6 10*3/uL (ref 0.1–0.9)
Monocytes: 6 %
Neutrophils Absolute: 6.2 10*3/uL (ref 1.4–7.0)
Neutrophils: 62 %
Platelets: 437 10*3/uL (ref 150–450)
RBC: 4.69 x10E6/uL (ref 3.77–5.28)
RDW: 13.8 % (ref 11.7–15.4)
WBC: 10 10*3/uL (ref 3.4–10.8)

## 2019-09-23 LAB — SEDIMENTATION RATE: Sed Rate: 24 mm/hr (ref 0–32)

## 2019-09-23 LAB — FERRITIN: Ferritin: 11 ng/mL — ABNORMAL LOW (ref 15–150)

## 2019-09-24 ENCOUNTER — Encounter: Payer: Self-pay | Admitting: Family Medicine

## 2019-09-25 NOTE — Telephone Encounter (Signed)
Nurses Please let the patient know that I do recommend a stool test IFBOT in order to be thorough

## 2019-09-26 ENCOUNTER — Telehealth: Payer: Self-pay | Admitting: Family Medicine

## 2019-09-26 DIAGNOSIS — D509 Iron deficiency anemia, unspecified: Secondary | ICD-10-CM

## 2019-09-26 NOTE — Telephone Encounter (Addendum)
Patient notified via phone and verbalized understanding. Blood work ordered in [employer]. IFBOT ordered in Epic and explained to patient and left up front for pick up.

## 2019-09-26 NOTE — Addendum Note (Signed)
Addended by: Margaretha Sheffield on: 09/26/2019 03:51 PM   Modules accepted: Orders

## 2019-09-26 NOTE — Telephone Encounter (Signed)
Patient notified via phone and verbalized understanding. Blood work ordered in Epic. IFBOT ordered in Epic and explained to patient and left up front for pick up. 

## 2019-09-26 NOTE — Telephone Encounter (Signed)
Pt would like to know if Dr. Lorin Picket is going to prescribe Iron supplement or if that is something she gets OTC.   [facility].   Also checking on doing stool test.

## 2019-09-26 NOTE — Telephone Encounter (Signed)
Nurses see result notes or even my chart notes To do IFBOT due to anemia  OTC iron sulfate 325 mg 1 daily with meal  Recheck CBC ferritin in 3 months please put orders in

## 2019-10-03 ENCOUNTER — Other Ambulatory Visit: Payer: Self-pay

## 2019-10-03 ENCOUNTER — Telehealth: Payer: Self-pay | Admitting: Family Medicine

## 2019-10-03 DIAGNOSIS — D509 Iron deficiency anemia, unspecified: Secondary | ICD-10-CM

## 2019-10-03 LAB — IFOBT (OCCULT BLOOD): IFOBT: NEGATIVE

## 2019-10-03 NOTE — Telephone Encounter (Signed)
Stool sample dropped off

## 2019-10-03 NOTE — Telephone Encounter (Signed)
Stool sample negative. Please advise. Thank you

## 2019-10-05 ENCOUNTER — Encounter: Payer: Self-pay | Admitting: Family Medicine

## 2019-10-05 DIAGNOSIS — D509 Iron deficiency anemia, unspecified: Secondary | ICD-10-CM

## 2019-10-05 DIAGNOSIS — N920 Excessive and frequent menstruation with regular cycle: Secondary | ICD-10-CM

## 2019-10-05 HISTORY — DX: Iron deficiency anemia, unspecified: D50.9

## 2019-10-05 HISTORY — DX: Excessive and frequent menstruation with regular cycle: N92.0

## 2019-10-05 NOTE — Telephone Encounter (Signed)
See result note thank you 

## 2019-10-06 NOTE — Telephone Encounter (Signed)
Pt verbalized understanding.

## 2019-10-06 NOTE — Telephone Encounter (Signed)
Discussed with pt. Stool negative for blood.  More than likely low ferritin related to heavy cycles.  Follow through with previous plan as far as taking iron tablet daily and rechecking lab work in the future per dr Lorin Picket.

## 2019-10-23 ENCOUNTER — Encounter: Payer: Self-pay | Admitting: Family Medicine

## 2019-10-24 ENCOUNTER — Other Ambulatory Visit: Payer: Self-pay | Admitting: Nurse Practitioner

## 2019-10-24 MED ORDER — CEFPROZIL 500 MG PO TABS: 500.0000 mg | ORAL_TABLET | Freq: Two times a day (BID) | ORAL | 0 refills | Status: DC

## 2019-11-05 ENCOUNTER — Encounter: Payer: Self-pay | Admitting: Family Medicine

## 2019-11-05 MED ORDER — FLUCONAZOLE 150 MG PO TABS: ORAL_TABLET | ORAL | 0 refills | Status: DC

## 2019-11-05 NOTE — Telephone Encounter (Signed)
Merlyn Albert, MD    Diflucan 150 one po three d apart times three

## 2019-11-08 ENCOUNTER — Other Ambulatory Visit: Payer: Self-pay

## 2019-11-08 ENCOUNTER — Ambulatory Visit: Payer: Self-pay | Attending: Internal Medicine

## 2019-11-08 DIAGNOSIS — Z23 Encounter for immunization: Secondary | ICD-10-CM | POA: Insufficient documentation

## 2019-11-08 NOTE — Progress Notes (Signed)
   Covid-19 Vaccination Clinic  Name:  Alicia Owens    MRN: [id number] DOB: 20-Aug-1976  11/08/2019  Alicia Owens was observed post Covid-19 immunization for 15 minutes without incident. She was provided with Vaccine Information Sheet and instruction to access the V-Safe system.   Alicia Owens was instructed to call 911 with any severe reactions post vaccine: Marland Kitchen Difficulty breathing  . Swelling of face and throat  . A fast heartbeat  . A bad rash all over body  . Dizziness and weakness   Immunizations Administered    Name Date Dose VIS Date Route   Moderna COVID-19 Vaccine 11/08/2019 10:23 AM 0.5 mL 08/05/2019 Intramuscular   Manufacturer: Moderna   Lot: [id number]   NDC: 35701-779-39

## 2019-11-12 ENCOUNTER — Encounter: Payer: Self-pay | Admitting: Family Medicine

## 2019-11-15 ENCOUNTER — Other Ambulatory Visit: Payer: Self-pay | Admitting: Family Medicine

## 2019-12-10 ENCOUNTER — Ambulatory Visit: Payer: Self-pay | Attending: Internal Medicine

## 2019-12-10 DIAGNOSIS — Z23 Encounter for immunization: Secondary | ICD-10-CM

## 2019-12-10 NOTE — Progress Notes (Signed)
   Covid-19 Vaccination Clinic  Name:  DERYN MASSENGALE    MRN: [id number] DOB: 1975-10-06  12/10/2019  Ms. Courser was observed post Covid-19 immunization for 15 minutes without incident. She was provided with Vaccine Information Sheet and instruction to access the V-Safe system.   Ms. Goodgame was instructed to call 911 with any severe reactions post vaccine: Marland Kitchen Difficulty breathing  . Swelling of face and throat  . A fast heartbeat  . A bad rash all over body  . Dizziness and weakness   Immunizations Administered    Name Date Dose VIS Date Route   Moderna COVID-19 Vaccine 12/10/2019 10:06 AM 0.5 mL 08/05/2019 Intramuscular   Manufacturer: Gala Murdoch   Lot: [id number]   NDC: 37308-168-38

## 2019-12-11 ENCOUNTER — Other Ambulatory Visit: Payer: Self-pay

## 2019-12-11 ENCOUNTER — Ambulatory Visit (INDEPENDENT_AMBULATORY_CARE_PROVIDER_SITE_OTHER): Payer: 59 | Admitting: Family Medicine

## 2019-12-11 DIAGNOSIS — N3000 Acute cystitis without hematuria: Secondary | ICD-10-CM

## 2019-12-11 DIAGNOSIS — R509 Fever, unspecified: Secondary | ICD-10-CM | POA: Diagnosis not present

## 2019-12-11 MED ORDER — CEPHALEXIN 500 MG PO CAPS: 500.0000 mg | ORAL_CAPSULE | Freq: Three times a day (TID) | ORAL | 0 refills | Status: DC

## 2019-12-11 NOTE — Progress Notes (Signed)
   Subjective:    Patient ID: Alicia Owens, female    DOB: 01-08-1976, 44 y.o.   MRN: [id number]  HPI Pt has been having left side pain for a couple of days. Pt believed to have UTI or kidney infection, but pt has had no burning or pain with urination. Pt states that she has at time tests when she gets a UTI. Leukocytes immediately turned dark purple and a trace of nitrates showed up. Pt had 2nd COVID shot yesterday. Last night she began to feel dizzy. She layed down and was very restless, head was hurting terrible and her nose was congested. Pt states that she vomited twice last night. This morning at 5:30 pt had fever of 99.5. Pt has taken Tylenol for fever. Patient relates dizzy last night and felt fatigued during the night fever this morning some headache some coughing not feeling good in addition to this left lower abdominal discomfort plus also positive urine dipstick Virtual Visit via Video Note  I connected with Alicia Owens on 12/11/19 at  9:30 AM EDT by a video enabled telemedicine application and verified that I am speaking with the correct person using two identifiers.  Location: Patient: home Provider: office   I discussed the limitations of evaluation and management by telemedicine and the availability of in person appointments. The patient expressed understanding and agreed to proceed.  History of Present Illness:    Observations/Objective:   Assessment and Plan:   Follow Up Instructions:    I discussed the assessment and treatment plan with the patient. The patient was provided an opportunity to ask questions and all were answered. The patient agreed with the plan and demonstrated an understanding of the instructions.   The patient was advised to call back or seek an in-person evaluation if the symptoms worsen or if the condition fails to improve as anticipated.  I provided 20 minutes of non-face-to-face time during this encounter.       Review of Systems    Constitutional: Negative for activity change and appetite change.  HENT: Negative for congestion and rhinorrhea.   Respiratory: Negative for cough and shortness of breath.   Cardiovascular: Negative for chest pain and leg swelling.  Gastrointestinal: Negative for abdominal pain, nausea and vomiting.  Genitourinary: Positive for frequency. Negative for dysuria.  Skin: Negative for color change.  Neurological: Negative for dizziness and weakness.  Psychiatric/Behavioral: Negative for agitation and confusion.       Objective:   Physical Exam  Patient had virtual visit Appears to be in no distress Atraumatic Neuro able to relate and oriented No apparent resp distress Color normal  Patient with low-grade fever she will call us back if getting worse over the next 24 hours     Assessment & Plan:  I believe a fair number of the symptoms are related to post Covid vaccine side effects Tylenol for this should gradually get better  Urinary tract infection doubt pyelonephritis but if Feeling worse over the next 24 hours call back and we will work in recommend Keflex 3 times daily 7 days  Zofran as needed for the nausea probably the nausea related to the vaccine but could be related to the UTI  Recent allergies from cutting grass I doubt a sinus infection at this point but Keflex should cover for that

## 2019-12-12 ENCOUNTER — Encounter: Payer: Self-pay | Admitting: Family Medicine

## 2019-12-15 ENCOUNTER — Other Ambulatory Visit: Payer: Self-pay | Admitting: Family Medicine

## 2019-12-29 ENCOUNTER — Other Ambulatory Visit: Payer: Self-pay | Admitting: *Deleted

## 2019-12-29 ENCOUNTER — Telehealth: Payer: Self-pay | Admitting: Family Medicine

## 2019-12-29 MED ORDER — CEFDINIR 300 MG PO CAPS: 300.0000 mg | ORAL_CAPSULE | Freq: Two times a day (BID) | ORAL | 10 refills | Status: DC

## 2019-12-29 MED ORDER — CEFDINIR 300 MG PO CAPS: 300.0000 mg | ORAL_CAPSULE | Freq: Two times a day (BID) | ORAL | 0 refills | Status: DC

## 2019-12-29 NOTE — Telephone Encounter (Signed)
Omnicef Omnicef 300 mg twice daily for 5 days if ongoing troubles follow-up

## 2019-12-29 NOTE — Telephone Encounter (Signed)
Patient recently treated for a UTI on 4/8.  She said she isn't sure it cleared completely.  Still have burning and frequency and she did a home test and she said the Leukocytes were positive but the Nitrite was negative.  She was hoping we could just call her in another round of antibiotics. Patient has another appt in May.  [facility]

## 2019-12-29 NOTE — Telephone Encounter (Signed)
Omnicef sent to pharmacy. Left message for pt to return call.

## 2019-12-29 NOTE — Telephone Encounter (Signed)
Pt.notified

## 2020-01-12 ENCOUNTER — Encounter: Payer: Self-pay | Admitting: Family Medicine

## 2020-01-12 NOTE — Telephone Encounter (Signed)
Autumn  The patient could be worked in at 3 PM with me today

## 2020-01-12 NOTE — Telephone Encounter (Signed)
The patient is coming in today at 3 pm to see Dr Lorin Picket

## 2020-01-16 ENCOUNTER — Other Ambulatory Visit: Payer: Self-pay | Admitting: Family Medicine

## 2020-01-20 ENCOUNTER — Encounter: Payer: Self-pay | Admitting: Nurse Practitioner

## 2020-01-20 ENCOUNTER — Other Ambulatory Visit: Payer: Self-pay | Admitting: Nurse Practitioner

## 2020-01-21 ENCOUNTER — Other Ambulatory Visit: Payer: Self-pay | Admitting: Nurse Practitioner

## 2020-01-21 MED ORDER — NITROFURANTOIN MONOHYD MACRO 100 MG PO CAPS
100.0000 mg | ORAL_CAPSULE | Freq: Two times a day (BID) | ORAL | 0 refills | Status: DC
Start: 2020-01-21 — End: 2020-01-28

## 2020-01-28 ENCOUNTER — Encounter: Payer: Self-pay | Admitting: Family Medicine

## 2020-01-28 ENCOUNTER — Ambulatory Visit (INDEPENDENT_AMBULATORY_CARE_PROVIDER_SITE_OTHER): Payer: 59 | Admitting: Family Medicine

## 2020-01-28 ENCOUNTER — Other Ambulatory Visit: Payer: Self-pay

## 2020-01-28 VITALS — BP 132/88 | Temp 97.2°F | Wt 164.8 lb

## 2020-01-28 DIAGNOSIS — R109 Unspecified abdominal pain: Secondary | ICD-10-CM

## 2020-01-28 DIAGNOSIS — N390 Urinary tract infection, site not specified: Secondary | ICD-10-CM

## 2020-01-28 DIAGNOSIS — G43009 Migraine without aura, not intractable, without status migrainosus: Secondary | ICD-10-CM | POA: Diagnosis not present

## 2020-01-28 DIAGNOSIS — J392 Other diseases of pharynx: Secondary | ICD-10-CM

## 2020-01-28 DIAGNOSIS — R350 Frequency of micturition: Secondary | ICD-10-CM | POA: Diagnosis not present

## 2020-01-28 DIAGNOSIS — D509 Iron deficiency anemia, unspecified: Secondary | ICD-10-CM

## 2020-01-28 DIAGNOSIS — J4530 Mild persistent asthma, uncomplicated: Secondary | ICD-10-CM | POA: Diagnosis not present

## 2020-01-28 DIAGNOSIS — K589 Irritable bowel syndrome without diarrhea: Secondary | ICD-10-CM

## 2020-01-28 DIAGNOSIS — R6889 Other general symptoms and signs: Secondary | ICD-10-CM

## 2020-01-28 DIAGNOSIS — R5383 Other fatigue: Secondary | ICD-10-CM

## 2020-01-28 LAB — POCT URINALYSIS DIPSTICK
Protein, UA: POSITIVE — AB
Spec Grav, UA: 1.02 (ref 1.010–1.025)
pH, UA: 6 (ref 5.0–8.0)

## 2020-01-28 MED ORDER — BUSPIRONE HCL 15 MG PO TABS: ORAL_TABLET | ORAL | 5 refills | Status: DC

## 2020-01-28 MED ORDER — HYDROCHLOROTHIAZIDE 25 MG PO TABS: ORAL_TABLET | ORAL | 6 refills | Status: DC

## 2020-01-28 MED ORDER — FLOVENT HFA 220 MCG/ACT IN AERO: INHALATION_SPRAY | RESPIRATORY_TRACT | 0 refills | Status: DC

## 2020-01-28 MED ORDER — CIPROFLOXACIN HCL 500 MG PO TABS: 500.0000 mg | ORAL_TABLET | Freq: Two times a day (BID) | ORAL | 0 refills | Status: DC

## 2020-01-28 MED ORDER — ALPRAZOLAM 0.25 MG PO TABS: ORAL_TABLET | ORAL | 5 refills | Status: DC

## 2020-01-28 MED ORDER — CITALOPRAM HYDROBROMIDE 20 MG PO TABS: 20.0000 mg | ORAL_TABLET | Freq: Every day | ORAL | 5 refills | Status: DC

## 2020-01-28 MED ORDER — CYCLOBENZAPRINE HCL 10 MG PO TABS: ORAL_TABLET | ORAL | 4 refills | Status: DC

## 2020-01-28 NOTE — Progress Notes (Signed)
Subjective:    Patient ID: Alicia Owens, female    DOB: [birth year], 44 y.o.   MRN: [id number]  HPI Pt here today for follow up.   Pt states she was suppose to get lab work done to check iron but has not had that done yet; waiting to have other lab work ordered.  1 more  Pt having frequent UTIs. Pt has had at least 6 UTIs since seeing [place] last; 3 in the past month.  Macrobid last week called in last week but is not helping. Pain when urinating and frequency, lower back pain.  We did discuss how sometimes urinary symptoms may not go away if not on the proper antibiotic may need urologic work-up but currently right now urine culture and treating with Cipro is best  Patient does relate intermittent IBS symptoms of cramping discomfort loose stools but denies bloody or mucousy stools  States her asthma and allergies are stable and yet at times a flareup she does utilize her medicines as directed   Significant migraine related issues but doing well with her medicines not having to use the sumatriptan much at all  She does relate a lot of fatigue and tiredness this could be multifactorial will need to check lab work also sleep questionnaire  Patient denies being depressed or anxious states her medicine doing a good job  She relates a lot of cold intolerance and also some soreness in the anterior portion of her neck she is concerned about the possibility of thyroid cancer but she has not had any nodules  She also has had some intermittent right upper quadrant pain discomfort that wakes her up during the night lasts for a few minutes then she can fall back asleep there is no nausea or vomiting or bloody stools and not having any symptoms during the day Urine frequency - Plan: POCT Urinalysis Dipstick, CBC with Differential/Platelet, Basic metabolic panel, Lipid panel, TSH, T4, free, Iron Binding Cap (TIBC)([facility]), Ferritin, Urine Culture, Lipase  Irritable bowel syndrome, unspecified  type - Plan: CBC with Differential/Platelet, Basic metabolic panel, Lipid panel, TSH, T4, free, Iron Binding Cap (TIBC)([facility]), Ferritin, Urine Culture, Lipase  Mild persistent reactive airway disease without complication - Plan: CBC with Differential/Platelet, Basic metabolic panel, Lipid panel, TSH, T4, free, Iron Binding Cap (TIBC)([facility]), Ferritin, Urine Culture, Lipase  Migraine without aura and without status migrainosus, not intractable - Plan: CBC with Differential/Platelet, Basic metabolic panel, Lipid panel, TSH, T4, free, Iron Binding Cap (TIBC)([facility]), Ferritin, Urine Culture, Lipase  Iron deficiency anemia, unspecified iron deficiency anemia type - Plan: CBC with Differential/Platelet, Basic metabolic panel, Lipid panel, TSH, T4, free, Iron Binding Cap (TIBC)([facility]), Ferritin, Urine Culture, Lipase  Frequent UTI - Plan: CBC with Differential/Platelet, Basic metabolic panel, Lipid panel, TSH, T4, free, Iron Binding Cap (TIBC)([facility]), Ferritin, Urine Culture, Lipase  Other fatigue - Plan: CBC with Differential/Platelet, Basic metabolic panel, Lipid panel, TSH, T4, free, Iron Binding Cap (TIBC)([facility]), Ferritin, Urine Culture, Lipase  Pharyngeal pain - Plan: CBC with Differential/Platelet, Basic metabolic panel, Lipid panel, TSH, T4, free, Iron Binding Cap (TIBC)([facility]), Ferritin, Urine Culture, Lipase  Cold intolerance - Plan: CBC with Differential/Platelet, Basic metabolic panel, Lipid panel, TSH, T4, free, Iron Binding Cap (TIBC)([facility]), Ferritin, Urine Culture, Lipase  Right sided abdominal pain - Plan: CBC with Differential/Platelet, Basic metabolic panel, Lipid panel, TSH, T4, free, Iron Binding Cap (TIBC)([facility]), Ferritin, Urine Culture, Lipase   Review of Systems  Constitutional: Negative for activity change, appetite change and fatigue.  HENT: Negative  for congestion and  rhinorrhea.   Respiratory: Negative for cough and shortness of breath.   Cardiovascular: Negative for chest pain and leg swelling.  Gastrointestinal: Positive for abdominal pain. Negative for diarrhea, nausea and rectal pain.  Endocrine: Negative for polydipsia and polyphagia.  Genitourinary: Positive for dysuria and urgency.  Skin: Negative for color change.  Neurological: Negative for dizziness and weakness.  Psychiatric/Behavioral: Negative for behavioral problems and confusion.       Objective:   Physical Exam Vitals reviewed.  Constitutional:      General: She is not in acute distress. HENT:     Head: Normocephalic and atraumatic.  Eyes:     General:        Right eye: No discharge.        Left eye: No discharge.  Neck:     Trachea: No tracheal deviation.  Cardiovascular:     Rate and Rhythm: Normal rate and regular rhythm.     Heart sounds: Normal heart sounds. No murmur.  Pulmonary:     Effort: Pulmonary effort is normal. No respiratory distress.     Breath sounds: Normal breath sounds.  Lymphadenopathy:     Cervical: No cervical adenopathy.  Skin:    General: Skin is warm and dry.  Neurological:     Mental Status: She is alert.     Coordination: Coordination normal.  Psychiatric:        Behavior: Behavior normal.    Neck examination is good no nodules felt in the thyroid       Assessment & Plan:  1. Urine frequency Patient's had frequent UTIs recently but I do not feel she needs to be referred to urology just yet I do recommend a urine culture we will go ahead with Cipro 500 mg twice daily for the next 7 days - POCT Urinalysis Dipstick - CBC with Differential/Platelet - Basic metabolic panel - Lipid panel - TSH - T4, free - Iron Binding Cap (TIBC)([facility]) - Ferritin - Urine Culture - Lipase  2. Irritable bowel syndrome, unspecified type Uses Bentyl on a as needed basis does not overuse it - CBC with Differential/Platelet - Basic  metabolic panel - Lipid panel - TSH - T4, free - Iron Binding Cap (TIBC)([facility]) - Ferritin - Urine Culture - Lipase  3. Mild persistent reactive airway disease without complication She uses a Flovent twice daily but when the symptoms are more severe she is instructed use 2 puffs twice daily for 2 to 3 weeks until it settles - CBC with Differential/Platelet - Basic metabolic panel - Lipid panel - TSH - T4, free - Iron Binding Cap (TIBC)([facility]) - Ferritin - Urine Culture - Lipase  4. Migraine without aura and without status migrainosus, not intractable Use Imitrex only when necessary because it can cause serotonin syndrome continue Topamax under good control currently - CBC with Differential/Platelet - Basic metabolic panel - Lipid panel - TSH - T4, free - Iron Binding Cap (TIBC)([facility]) - Ferritin - Urine Culture - Lipase  5. Iron deficiency anemia, unspecified iron deficiency anemia type Patient relating a lot of fatigue tiredness needs to do follow-up regarding ferritin and CBC await the results - CBC with Differential/Platelet - Basic metabolic panel - Lipid panel - TSH - T4, free - Iron Binding Cap (TIBC)([facility]) - Ferritin - Urine Culture - Lipase  6. Frequent UTI Culture urine treat with Cipro hold off on referral to urology currently unless more frequent UTIs occur - CBC with Differential/Platelet - Basic metabolic panel -  Lipid panel - TSH - T4, free - Iron Binding Cap (TIBC)([facility]) - Ferritin - Urine Culture - Lipase  7. Other fatigue Significant fatigue tiredness we will check thyroid function her neck examination did not reveal any type of tenderness - CBC with Differential/Platelet - Basic metabolic panel - Lipid panel - TSH - T4, free - Iron Binding Cap (TIBC)([facility]) - Ferritin - Urine Culture - Lipase  8. Pharyngeal pain She relates external neck discomfort intermittently  she is concerned about thyroid but I do not feel any enlargement of the thyroid currently had no tenderness on today's exam - CBC with Differential/Platelet - Basic metabolic panel - Lipid panel - TSH - T4, free - Iron Binding Cap (TIBC)([facility]) - Ferritin - Urine Culture - Lipase  9. Cold intolerance Significant cold intolerance therefore check thyroid function - CBC with Differential/Platelet - Basic metabolic panel - Lipid panel - TSH - T4, free - Iron Binding Cap (TIBC)([facility]) - Ferritin - Urine Culture - Lipase  10. Right sided abdominal pain Right side abdominal pain and discomfort recommend going ahead with lab work offered patient to do ultrasound she defers currently if her symptomatology persists or worsen ultrasound recommended - CBC with Differential/Platelet - Basic metabolic panel - Lipid panel - TSH - T4, free - Iron Binding Cap (TIBC)([facility]) - Ferritin - Urine Culture - Lipase

## 2020-01-30 LAB — URINE CULTURE

## 2020-01-31 LAB — IRON AND TIBC
Iron Saturation: 22 % (ref 15–55)
Iron: 90 ug/dL (ref 27–159)
Total Iron Binding Capacity: 409 ug/dL (ref 250–450)
UIBC: 319 ug/dL (ref 131–425)

## 2020-01-31 LAB — BASIC METABOLIC PANEL
BUN/Creatinine Ratio: 12 (ref 9–23)
BUN: 10 mg/dL (ref 6–24)
CO2: 24 mmol/L (ref 20–29)
Calcium: 9.5 mg/dL (ref 8.7–10.2)
Chloride: 94 mmol/L — ABNORMAL LOW (ref 96–106)
Creatinine, Ser: 0.82 mg/dL (ref 0.57–1.00)
GFR calc Af Amer: 101 mL/min/{1.73_m2} (ref >59)
GFR calc non Af Amer: 88 mL/min/{1.73_m2} (ref >59)
Glucose: 110 mg/dL — ABNORMAL HIGH (ref 65–99)
Potassium: 3.7 mmol/L (ref 3.5–5.2)
Sodium: 137 mmol/L (ref 134–144)

## 2020-01-31 LAB — CBC WITH DIFFERENTIAL/PLATELET
Basophils Absolute: 0.1 10*3/uL (ref 0.0–0.2)
Basos: 1 %
EOS (ABSOLUTE): 0.5 10*3/uL — ABNORMAL HIGH (ref 0.0–0.4)
Eos: 5 %
Hematocrit: 44.2 % (ref 34.0–46.6)
Hemoglobin: 14.6 g/dL (ref 11.1–15.9)
Immature Grans (Abs): 0 10*3/uL (ref 0.0–0.1)
Immature Granulocytes: 0 %
Lymphocytes Absolute: 3.4 10*3/uL — ABNORMAL HIGH (ref 0.7–3.1)
Lymphs: 33 %
MCH: 30.2 pg (ref 26.6–33.0)
MCHC: 33 g/dL (ref 31.5–35.7)
MCV: 91 fL (ref 79–97)
Monocytes Absolute: 0.6 10*3/uL (ref 0.1–0.9)
Monocytes: 6 %
Neutrophils Absolute: 5.8 10*3/uL (ref 1.4–7.0)
Neutrophils: 55 %
Platelets: 405 10*3/uL (ref 150–450)
RBC: 4.84 x10E6/uL (ref 3.77–5.28)
RDW: 12.7 % (ref 11.7–15.4)
WBC: 10.5 10*3/uL (ref 3.4–10.8)

## 2020-01-31 LAB — FERRITIN: Ferritin: 84 ng/mL (ref 15–150)

## 2020-01-31 LAB — LIPID PANEL
Chol/HDL Ratio: 5.3 ratio — ABNORMAL HIGH (ref 0.0–4.4)
Cholesterol, Total: 264 mg/dL — ABNORMAL HIGH (ref 100–199)
HDL: 50 mg/dL (ref >39)
LDL Chol Calc (NIH): 166 mg/dL — ABNORMAL HIGH (ref 0–99)
Triglycerides: 255 mg/dL — ABNORMAL HIGH (ref 0–149)
VLDL Cholesterol Cal: 48 mg/dL — ABNORMAL HIGH (ref 5–40)

## 2020-01-31 LAB — LIPASE: Lipase: 49 U/L (ref 14–72)

## 2020-01-31 LAB — TSH: TSH: 0.534 u[IU]/mL (ref 0.450–4.500)

## 2020-01-31 LAB — T4, FREE: Free T4: 1.05 ng/dL (ref 0.82–1.77)

## 2020-02-16 ENCOUNTER — Other Ambulatory Visit: Payer: Self-pay | Admitting: Family Medicine

## 2020-02-16 NOTE — Telephone Encounter (Signed)
01/28/20 was last visit for migraine

## 2020-04-21 ENCOUNTER — Encounter: Payer: Self-pay | Admitting: Family Medicine

## 2020-04-26 ENCOUNTER — Other Ambulatory Visit: Payer: Self-pay | Admitting: Family Medicine

## 2020-05-18 ENCOUNTER — Other Ambulatory Visit: Payer: Self-pay | Admitting: Family Medicine

## 2020-05-27 ENCOUNTER — Other Ambulatory Visit: Payer: Self-pay | Admitting: *Deleted

## 2020-05-27 DIAGNOSIS — Z131 Encounter for screening for diabetes mellitus: Secondary | ICD-10-CM

## 2020-05-27 DIAGNOSIS — Z13220 Encounter for screening for lipoid disorders: Secondary | ICD-10-CM

## 2020-05-31 ENCOUNTER — Encounter: Payer: Self-pay | Admitting: Family Medicine

## 2020-06-12 ENCOUNTER — Other Ambulatory Visit (INDEPENDENT_AMBULATORY_CARE_PROVIDER_SITE_OTHER): Payer: 59 | Admitting: *Deleted

## 2020-06-12 DIAGNOSIS — Z23 Encounter for immunization: Secondary | ICD-10-CM | POA: Diagnosis not present

## 2020-06-14 ENCOUNTER — Encounter: Payer: Self-pay | Admitting: Family Medicine

## 2020-06-14 ENCOUNTER — Other Ambulatory Visit: Payer: Self-pay | Admitting: Family Medicine

## 2020-06-14 ENCOUNTER — Other Ambulatory Visit: Payer: Self-pay | Admitting: *Deleted

## 2020-06-14 MED ORDER — CEFPROZIL 500 MG PO TABS: ORAL_TABLET | ORAL | 2 refills | Status: DC

## 2020-06-14 MED ORDER — FLUCONAZOLE 150 MG PO TABS
150.0000 mg | ORAL_TABLET | Freq: Once | ORAL | 0 refills | Status: AC
Start: 2020-06-14 — End: 2020-06-14

## 2020-06-14 NOTE — Telephone Encounter (Signed)
Cefzil 500 mg 1 twice daily for 5 days  Recommend holding off on Cipro currently because this medication has some significant negative effects if used frequently  Diflucan 150 mg now may repeat again in 1 week's time if need be, #2, 2 refills  If ongoing troubles follow-up please

## 2020-06-28 ENCOUNTER — Encounter: Payer: Self-pay | Admitting: Family Medicine

## 2020-07-03 ENCOUNTER — Other Ambulatory Visit: Payer: Self-pay | Admitting: Family Medicine

## 2020-07-09 ENCOUNTER — Encounter: Payer: Self-pay | Admitting: Family Medicine

## 2020-07-13 ENCOUNTER — Other Ambulatory Visit: Payer: Self-pay | Admitting: *Deleted

## 2020-07-13 DIAGNOSIS — E785 Hyperlipidemia, unspecified: Secondary | ICD-10-CM

## 2020-07-13 DIAGNOSIS — R7303 Prediabetes: Secondary | ICD-10-CM

## 2020-07-13 DIAGNOSIS — Z79899 Other long term (current) drug therapy: Secondary | ICD-10-CM

## 2020-07-13 DIAGNOSIS — I10 Essential (primary) hypertension: Secondary | ICD-10-CM

## 2020-07-13 DIAGNOSIS — Z13220 Encounter for screening for lipoid disorders: Secondary | ICD-10-CM

## 2020-07-13 MED ORDER — BUSPIRONE HCL 15 MG PO TABS
ORAL_TABLET | ORAL | 3 refills | Status: DC
Start: 2020-07-13 — End: 2020-10-12

## 2020-07-13 MED ORDER — ALPRAZOLAM 0.25 MG PO TABS
ORAL_TABLET | ORAL | 3 refills | Status: DC
Start: 2020-07-13 — End: 2021-07-01

## 2020-07-13 MED ORDER — OMEPRAZOLE 20 MG PO CPDR
DELAYED_RELEASE_CAPSULE | ORAL | 3 refills | Status: DC
Start: 2020-07-13 — End: 2021-04-12

## 2020-07-13 MED ORDER — POTASSIUM CHLORIDE ER 10 MEQ PO TBCR
EXTENDED_RELEASE_TABLET | ORAL | 3 refills | Status: DC
Start: 2020-07-13 — End: 2020-10-12

## 2020-07-13 MED ORDER — HYDROCHLOROTHIAZIDE 25 MG PO TABS
ORAL_TABLET | ORAL | 3 refills | Status: DC
Start: 2020-07-13 — End: 2020-10-12

## 2020-07-13 MED ORDER — CITALOPRAM HYDROBROMIDE 20 MG PO TABS
20.0000 mg | ORAL_TABLET | Freq: Every day | ORAL | 3 refills | Status: DC
Start: 2020-07-13 — End: 2020-10-12

## 2020-07-13 NOTE — Telephone Encounter (Signed)
Nurses Please connect with [facility]  She may have 3 months additional refills on her medications She should do blood work before her follow-up visit She should schedule a visit later in January Do labs a week before  Please do hemoglobin A1c, lipid, metabolic 7, liver  Also please initiate referral to diabetic nutritionist regarding prediabetes  Thanks-Dr. Lorin Picket

## 2020-07-17 ENCOUNTER — Other Ambulatory Visit: Payer: Self-pay | Admitting: Family Medicine

## 2020-07-19 ENCOUNTER — Encounter: Payer: Self-pay | Admitting: Family Medicine

## 2020-07-19 ENCOUNTER — Other Ambulatory Visit: Payer: Self-pay | Admitting: *Deleted

## 2020-07-19 MED ORDER — TOPIRAMATE 50 MG PO TABS
ORAL_TABLET | ORAL | 1 refills | Status: DC
Start: 2020-07-19 — End: 2020-09-02

## 2020-07-19 NOTE — Telephone Encounter (Signed)
Nurses I have no idea why her topiramate was denied?  Please check into this I am perfectly fine with 90-day prescription and 1 refill  If it is a insurance issue please find out the details what we need to do to help get it corrected thank you-Dr. Lorin Picket

## 2020-08-09 ENCOUNTER — Encounter: Payer: Self-pay | Admitting: Family Medicine

## 2020-08-11 ENCOUNTER — Ambulatory Visit (INDEPENDENT_AMBULATORY_CARE_PROVIDER_SITE_OTHER): Payer: 59 | Admitting: Family Medicine

## 2020-08-11 ENCOUNTER — Other Ambulatory Visit: Payer: Self-pay

## 2020-08-11 DIAGNOSIS — J019 Acute sinusitis, unspecified: Secondary | ICD-10-CM | POA: Diagnosis not present

## 2020-08-11 DIAGNOSIS — L739 Follicular disorder, unspecified: Secondary | ICD-10-CM | POA: Diagnosis not present

## 2020-08-11 MED ORDER — DOXYCYCLINE HYCLATE 100 MG PO TABS: ORAL_TABLET | ORAL | 0 refills | Status: DC

## 2020-08-11 NOTE — Progress Notes (Signed)
   Subjective:    Patient ID: MAILYN STEICHEN, female    DOB: [birth year], 44 y.o.   MRN: [id number]  HPI Pt having right ear pain and cough. Cough has been going on for a while. Pt also having issues with right nostril being swollen. Noticed about one week ago.  Significant ear pain sinus pressure denies high fever chills sweats wheezing difficulty breathing energy level overall fairly good.  Symptoms been going on for a couple weeks.  The area on her nostril is giving her a lot of pain and discomfort Review of Systems     Objective:   Physical Exam  She has a sore on the upper aspect of her nasal area this appears to be a folliculitis does not appear to be cancerous eardrums are normal throat is normal neck no masses lungs clear heart regular      Assessment & Plan:  Viral process Staph infection of the nose Doxycycline twice daily 10 days I doubt Covid Patient has been vaccinated this illness is been going on for a period of time unlikely to be Covid

## 2020-08-27 ENCOUNTER — Encounter: Payer: Self-pay | Admitting: Family Medicine

## 2020-08-30 ENCOUNTER — Encounter: Payer: Self-pay | Admitting: Family Medicine

## 2020-08-30 ENCOUNTER — Other Ambulatory Visit: Payer: Self-pay

## 2020-08-30 ENCOUNTER — Ambulatory Visit (INDEPENDENT_AMBULATORY_CARE_PROVIDER_SITE_OTHER): Payer: 59 | Admitting: Family Medicine

## 2020-08-30 VITALS — HR 78 | Temp 97.5°F | Resp 16

## 2020-08-30 DIAGNOSIS — R059 Cough, unspecified: Secondary | ICD-10-CM | POA: Diagnosis not present

## 2020-08-30 DIAGNOSIS — J3489 Other specified disorders of nose and nasal sinuses: Secondary | ICD-10-CM | POA: Diagnosis not present

## 2020-08-30 DIAGNOSIS — J4540 Moderate persistent asthma, uncomplicated: Secondary | ICD-10-CM | POA: Diagnosis not present

## 2020-08-30 MED ORDER — BENZONATATE 100 MG PO CAPS: 100.0000 mg | ORAL_CAPSULE | Freq: Two times a day (BID) | ORAL | 0 refills | Status: DC | PRN

## 2020-08-30 MED ORDER — BUDESONIDE-FORMOTEROL FUMARATE 80-4.5 MCG/ACT IN AERO: 2.0000 | INHALATION_SPRAY | Freq: Two times a day (BID) | RESPIRATORY_TRACT | 3 refills | Status: DC

## 2020-08-30 MED ORDER — ALBUTEROL SULFATE HFA 108 (90 BASE) MCG/ACT IN AERS: 2.0000 | INHALATION_SPRAY | Freq: Four times a day (QID) | RESPIRATORY_TRACT | 1 refills | Status: DC | PRN

## 2020-08-30 MED ORDER — MUPIROCIN 2 % EX OINT: TOPICAL_OINTMENT | CUTANEOUS | 0 refills | Status: DC

## 2020-08-30 NOTE — Progress Notes (Signed)
Patient ID: Alicia Owens, female    DOB: 1976/09/04, 44 y.o.   MRN: [id number]   Chief Complaint  Patient presents with   Sinusitis   Subjective:  CC: cough and congestion x 2 weeks  This is a new problem.  Presents today for an acute outside visit with a complaint of cough and congestion.  Was also seen 2 weeks ago with cough congestion and a sore in the right nare.  Dr. Lilyan Punt placed her on doxycycline at that time, she has completed this course, without improvement in her symptoms.  She is reporting fatigue, congestion, cough, headache with coughing spells and wheezing.  She does have a history of asthma, is already on albuterol and Flovent which she has been using regularly.  She denies fever, chills, shortness of breath, ear pain, abdominal pain.  She reports she is using her rescue inhaler, but has increased her maintenance Flovent inhaler from 1 puff to 2 puffs twice per day.   symptoms for 2 weeks. Having cough, chest congestion. Saw dr Lorin Picket on 12/8 for sinus symptoms and staff infection in nose. Pt states place in nose has not went away. Took at home covid 9 days ago and it was negative.   Needs refill on flovent and ventolin inhaler.   Medical History Alicia Owens has a past medical history of Anemia, Anxiety, Chronic abdominal pain, Depression, Gastroparesis, GERD (gastroesophageal reflux disease), Headache(784.0), Hypertension, Hyperthyroidism (2011), IBS (irritable bowel syndrome), Iron deficiency anemia, unspecified (10/05/2019), Menorrhagia (10/05/2019), PONV (postoperative nausea and vomiting), and PP care - C/S 12/17 (08/22/2011).   Outpatient Encounter Medications as of 08/30/2020  Medication Sig   ALPRAZolam (XANAX) 0.25 MG tablet TAKE ONE TABLET TWICE DAILY AS NEEDED   benzonatate (TESSALON) 100 MG capsule Take 1 capsule (100 mg total) by mouth 2 (two) times daily as needed for cough.   budesonide-formoterol (SYMBICORT) 80-4.5 MCG/ACT inhaler Inhale 2 puffs into the  lungs 2 (two) times daily.   busPIRone (BUSPAR) 15 MG tablet TAKE ONE (1) TABLET THREE (3) TIMES EACH DAY   citalopram (CELEXA) 20 MG tablet Take 1 tablet (20 mg total) by mouth daily.   CRANBERRY PO Take by mouth.   cyclobenzaprine (FLEXERIL) 10 MG tablet TAKE 1 TABLET BY MOUTH 3 TIMES DAILY AS NEEDED ( BETWEEN MEALS AND ATBEDTIME )   dicyclomine (BENTYL) 20 MG tablet Take 20 mg by mouth as needed for spasms.   fexofenadine (ALLEGRA) 180 MG tablet Take 180 mg by mouth daily.   FLOVENT HFA 220 MCG/ACT inhaler INHALE 2 PUFFS INTO THE LUNGS TWICE A DAY   hydrochlorothiazide (HYDRODIURIL) 25 MG tablet 1 every morning   Lactobacillus-Inulin (CULTURELLE DIGESTIVE HEALTH) CAPS Take 1 capsule by mouth daily.   mometasone (NASONEX) 50 MCG/ACT nasal spray Place 2 sprays into the nose as needed.    montelukast (SINGULAIR) 10 MG tablet TAKE ONE TABLET (10MG  TOTAL) BY MOUTH ATBEDTIME   Multiple Vitamins-Minerals (MULTIVITAMIN WITH MINERALS) tablet Take 1 tablet by mouth daily.   mupirocin ointment (BACTROBAN) 2 % Apply bid to right nare   omeprazole (PRILOSEC) 20 MG capsule TAKE ONE (1) CAPSULE BY MOUTH TWICE A DAY. (EVERY 12 HOURS.)   ondansetron (ZOFRAN) 8 MG tablet TAKE ONE TABLET BY MOUTH THREE TO FOUR TIMES A DAY AS NEEDED   OVER THE COUNTER MEDICATION Birth control - Lo loestrin fe   potassium chloride (KLOR-CON) 10 MEQ tablet TAKE ONE TABLET BY MOUTH ONCE A DAY AS NEEDED WITH HCTZ   sucralfate (CARAFATE) 1  g tablet Crush and dissolve one tablet into 30 cc of water and take with meals   SUMAtriptan (IMITREX) 100 MG tablet TAKE ONE TABLET BY MOUTH AT ONSET OF HEADACHE. MAY REPEAT ONE TIME IN TWO HOURS IF NO RELIEF. DO NOT EXCEED TWO TABLETS IN 24 HOURS.   SUPER B COMPLEX/C PO Take 1 tablet by mouth daily.    SV CRANBERRY PO Take 1 tablet by mouth daily.    topiramate (TOPAMAX) 50 MG tablet TAKE ONE TABLET BY MOUTH IN THE MORNING AND TWO TABLETS IN THE EVENING   TURMERIC PO Take  1 capsule by mouth daily.    VITAMIN D, CHOLECALCIFEROL, PO Take by mouth 3 (three) times a week.   [DISCONTINUED] albuterol (VENTOLIN HFA) 108 (90 Base) MCG/ACT inhaler Inhale 2 puffs into the lungs every 6 (six) hours as needed for wheezing or shortness of breath.   [DISCONTINUED] doxycycline (VIBRA-TABS) 100 MG tablet Take one tablet po BID for 10 days   albuterol (VENTOLIN HFA) 108 (90 Base) MCG/ACT inhaler Inhale 2 puffs into the lungs every 6 (six) hours as needed for wheezing or shortness of breath.   No facility-administered encounter medications on file as of 08/30/2020.     Review of Systems  Constitutional: Positive for fatigue. Negative for chills and fever.  HENT: Positive for congestion. Negative for ear pain and sore throat.   Respiratory: Positive for cough. Negative for shortness of breath.   Cardiovascular: Negative for chest pain.  Gastrointestinal: Negative for abdominal pain.     Vitals Pulse 78    Temp (!) 97.5 F (36.4 C)    Resp 16    SpO2 98%   Objective:   Physical Exam Vitals reviewed.  Constitutional:      General: She is not in acute distress.    Appearance: Normal appearance.  HENT:     Right Ear: Tympanic membrane normal.     Left Ear: Tympanic membrane normal.     Nose:     Comments: Area inside right nare with scab    Mouth/Throat:     Mouth: Mucous membranes are moist.  Cardiovascular:     Rate and Rhythm: Normal rate and regular rhythm.     Heart sounds: Normal heart sounds.  Pulmonary:     Effort: Pulmonary effort is normal.     Breath sounds: Normal breath sounds. No wheezing.  Skin:    General: Skin is warm and dry.  Neurological:     General: No focal deficit present.     Mental Status: She is alert.  Psychiatric:        Behavior: Behavior normal.      Assessment and Plan   1. Moderate persistent asthma, unspecified whether complicated - albuterol (VENTOLIN HFA) 108 (90 Base) MCG/ACT inhaler; Inhale 2 puffs into the  lungs every 6 (six) hours as needed for wheezing or shortness of breath.  Dispense: 18 g; Refill: 1 - budesonide-formoterol (SYMBICORT) 80-4.5 MCG/ACT inhaler; Inhale 2 puffs into the lungs 2 (two) times daily.  Dispense: 1 each; Refill: 3 - Novel Coronavirus, NAA ([facility])  2. Cough - benzonatate (TESSALON) 100 MG capsule; Take 1 capsule (100 mg total) by mouth 2 (two) times daily as needed for cough.  Dispense: 20 capsule; Refill: 0 - Novel Coronavirus, NAA ([facility])  3. Nasal sore - mupirocin ointment (BACTROBAN) 2 %; Apply bid to right nare  Dispense: 22 g; Refill: 0   Due to ongoing symptoms of cough and congestion, will test for Covid  19, her last test was 9 days ago which was negative.  Due to her history of asthma and the increased need of her maintenance inhaler, will change her Flovent  to Symbicort.  She will stop using the Flovent during the use of the Symbicort, she wishes to go back to just Flovent once this illness is over.  She will let the office know, and we can change this order back in her record.  She will continue to use her rescue inhaler as needed.  She also requested prescription medication to assist with her cough.  Continues to have area on right near that appears to be scabbed over.  She reports this is very tender, has not changed since she took doxycycline.  Will treat with mupirocin ointment twice per day.  Covid-19 respiratory warning:  Covid-19 is a virus that causes hypoxia (low oxygen level in blood) in some people. If you develop any changes in your usual breathing pattern: difficulty catching your breath, more short winded with activity or with resting, or anything that concerns you about your breathing, do not hesitate to go to the emergency department immediately for evaluation. Please do not delay to get treatment.    Agrees with plan of care discussed today. Understands warning signs to seek further care: Fever, chills, shortness of breath, any  significant change to health.  COVID-19 respiratory warning given as above. Understands to follow-up if symptoms do not improve, or worsen.  She will let us know if this area in her right nare does not improve with the mupirocin ointment.  We will notify her once Covid results are available.   Dorena Bodo, FNP-C 08/30/2020

## 2020-08-31 ENCOUNTER — Encounter: Payer: Self-pay | Admitting: Family Medicine

## 2020-08-31 LAB — NOVEL CORONAVIRUS, NAA: SARS-CoV-2, NAA: DETECTED — AB

## 2020-08-31 LAB — SARS-COV-2, NAA 2 DAY TAT

## 2020-09-01 ENCOUNTER — Other Ambulatory Visit: Payer: Self-pay

## 2020-09-01 ENCOUNTER — Other Ambulatory Visit: Payer: Self-pay | Admitting: Family Medicine

## 2020-09-01 ENCOUNTER — Encounter: Payer: Self-pay | Admitting: *Deleted

## 2020-09-01 MED ORDER — FLOVENT HFA 220 MCG/ACT IN AERO
INHALATION_SPRAY | RESPIRATORY_TRACT | 5 refills | Status: DC
Start: 2020-09-01 — End: 2021-04-12

## 2020-09-01 NOTE — Telephone Encounter (Signed)
01/28/20 was last visit for migraines

## 2020-09-01 NOTE — Telephone Encounter (Signed)
Nurses Please send an refill of her Flovent 220 inhaler with directions of 1 to 2 puffs twice daily as directed with 5 refills Please cancel Symbicort  [place] currently is having symptoms of Covid more than likely picked it up recently although she has had sickness for approximately 2 weeks (it is felt that she picked this up recently because both of her children have just started showing some symptoms of Covid)  Nurses-please send information to the infusion clinic regarding this patient  I have instructed [facility] to reach out to [place] if any progressive troubles problems or issues.

## 2020-09-06 ENCOUNTER — Encounter: Payer: Self-pay | Admitting: Family Medicine

## 2020-09-06 NOTE — Telephone Encounter (Signed)
Nurses It would be fine to allow for Lilly to return to school but the current CDC guidelines state 5 days once asymptomatic then wear a mask for at least 5 more days when around others  As for Alicia Owens it would be fine to give him another 1 to 2 days out from school before returning  As for [facility] it would be fine for her to do a negative test hopefully through [facility] But requiring a negative test at the end of 10 days is beyond the current recommendations but she is obligated to follow her workplace guidelines.  She should be aware that some people can test positive in the nose for several weeks after an infection even though it is felt fine for them to return to normal everyday life.  But once again she is stuck following what her workplace as

## 2020-10-12 ENCOUNTER — Other Ambulatory Visit: Payer: Self-pay

## 2020-10-12 ENCOUNTER — Telehealth (INDEPENDENT_AMBULATORY_CARE_PROVIDER_SITE_OTHER): Payer: BC Managed Care – PPO | Admitting: Family Medicine

## 2020-10-12 ENCOUNTER — Telehealth: Payer: Self-pay | Admitting: Family Medicine

## 2020-10-12 ENCOUNTER — Encounter: Payer: Self-pay | Admitting: Family Medicine

## 2020-10-12 DIAGNOSIS — I10 Essential (primary) hypertension: Secondary | ICD-10-CM | POA: Diagnosis not present

## 2020-10-12 DIAGNOSIS — J4540 Moderate persistent asthma, uncomplicated: Secondary | ICD-10-CM | POA: Diagnosis not present

## 2020-10-12 DIAGNOSIS — K219 Gastro-esophageal reflux disease without esophagitis: Secondary | ICD-10-CM | POA: Diagnosis not present

## 2020-10-12 DIAGNOSIS — F341 Dysthymic disorder: Secondary | ICD-10-CM

## 2020-10-12 MED ORDER — TOPIRAMATE 50 MG PO TABS: ORAL_TABLET | ORAL | 5 refills | Status: DC

## 2020-10-12 MED ORDER — BUSPIRONE HCL 15 MG PO TABS: ORAL_TABLET | ORAL | 5 refills | Status: DC

## 2020-10-12 MED ORDER — POTASSIUM CHLORIDE ER 10 MEQ PO TBCR: EXTENDED_RELEASE_TABLET | ORAL | 5 refills | Status: DC

## 2020-10-12 MED ORDER — MONTELUKAST SODIUM 10 MG PO TABS: ORAL_TABLET | ORAL | 5 refills | Status: DC

## 2020-10-12 MED ORDER — HYDROCHLOROTHIAZIDE 25 MG PO TABS: ORAL_TABLET | ORAL | 5 refills | Status: DC

## 2020-10-12 MED ORDER — CITALOPRAM HYDROBROMIDE 20 MG PO TABS: 20.0000 mg | ORAL_TABLET | Freq: Every day | ORAL | 5 refills | Status: DC

## 2020-10-12 MED ORDER — FAMOTIDINE 40 MG PO TABS: 40.0000 mg | ORAL_TABLET | Freq: Every day | ORAL | 5 refills | Status: DC

## 2020-10-12 NOTE — Patient Instructions (Signed)
Food Choices for Gastroesophageal Reflux Disease, Adult When you have gastroesophageal reflux disease (GERD), the foods you eat and your eating habits are very important. Choosing the right foods can help ease your discomfort. Think about working with a food expert (dietitian) to help you make good choices. What are tips for following this plan? Reading food labels  Look for foods that are low in saturated fat. Foods that may help with your symptoms include: ? Foods that have less than 5% of daily value (DV) of fat. ? Foods that have 0 grams of trans fat. Cooking  Do not fry your food.  Cook your food by baking, steaming, grilling, or broiling. These are all methods that do not need a lot of fat for cooking.  To add flavor, try to use herbs that are low in spice and acidity. Meal planning  Choose healthy foods that are low in fat, such as: ? Fruits and vegetables. ? Whole grains. ? Low-fat dairy products. ? Lean meats, fish, and poultry.  Eat small meals often instead of eating 3 large meals each day. Eat your meals slowly in a place where you are relaxed. Avoid bending over or lying down until 2-3 hours after eating.  Limit high-fat foods such as fatty meats or fried foods.  Limit your intake of fatty foods, such as oils, butter, and shortening.  Avoid the following as told by your doctor: ? Foods that cause symptoms. These may be different for different people. Keep a food diary to keep track of foods that cause symptoms. ? Alcohol. ? Drinking a lot of liquid with meals. ? Eating meals during the 2-3 hours before bed.   Lifestyle  Stay at a healthy weight. Ask your doctor what weight is healthy for you. If you need to lose weight, work with your doctor to do so safely.  Exercise for at least 30 minutes on 5 or more days each week, or as told by your doctor.  Wear loose-fitting clothes.  Do not smoke or use any products that contain nicotine or tobacco. If you need help  quitting, ask your doctor.  Sleep with the head of your bed higher than your feet. Use a wedge under the mattress or blocks under the bed frame to raise the head of the bed.  Chew sugar-free gum after meals. What foods should eat? Eat a healthy, well-balanced diet of fruits, vegetables, whole grains, low-fat dairy products, lean meats, fish, and poultry. Each person is different. Foods that may cause symptoms in one person may not cause any symptoms in another person. Work with your doctor to find foods that are safe for you. The items listed above may not be a complete list of what you can eat and drink. Contact a food expert for more options.   What foods should I avoid? Limiting some of these foods may help in managing the symptoms of GERD. Everyone is different. Talk with a food expert or your doctor to help you find the exact foods to avoid, if any. Fruits Any fruits prepared with added fat. Any fruits that cause symptoms. For some people, this may include citrus fruits, such as oranges, grapefruit, pineapple, and lemons. Vegetables Deep-fried vegetables. French fries. Any vegetables prepared with added fat. Any vegetables that cause symptoms. For some people, this may include tomatoes and tomato products, chili peppers, onions and garlic, and horseradish. Grains Pastries or quick breads with added fat. Meats and other proteins High-fat meats, such as fatty beef or pork,   hot dogs, ribs, ham, sausage, salami, and bacon. Fried meat or protein, including fried fish and fried chicken. Nuts and nut butters, in large amounts. Dairy Whole milk and chocolate milk. Sour cream. Cream. Ice cream. Cream cheese. Milkshakes. Fats and oils Butter. Margarine. Shortening. Ghee. Beverages Coffee and tea, with or without caffeine. Carbonated beverages. Sodas. Energy drinks. Fruit juice made with acidic fruits, such as orange or grapefruit. Tomato juice. Alcoholic drinks. Sweets and desserts Chocolate and  cocoa. Donuts. Seasonings and condiments Pepper. Peppermint and spearmint. Added salt. Any condiments, herbs, or seasonings that cause symptoms. For some people, this may include curry, hot sauce, or vinegar-based salad dressings. The items listed above may not be a complete list of what you should not eat and drink. Contact a food expert for more options. Questions to ask your doctor Diet and lifestyle changes are often the first steps that are taken to manage symptoms of GERD. If diet and lifestyle changes do not help, talk with your doctor about taking medicines. Where to find more information  International [employer] Gastrointestinal Disorders: aboutgerd.org Summary  When you have GERD, food and lifestyle choices are very important in easing your symptoms.  Eat small meals often instead of 3 large meals a day. Eat your meals slowly and in a place where you are relaxed.  Avoid bending over or lying down until 2-3 hours after eating.  Limit high-fat foods such as fatty meats or fried foods. This information is not intended to replace advice given to you by your health care provider. Make sure you discuss any questions you have with your health care provider. Document Revised: 03/01/2020 Document Reviewed: 03/01/2020 Elsevier Patient Education  2021 Elsevier Inc.  

## 2020-10-12 NOTE — Progress Notes (Signed)
   Subjective:    Patient ID: Alicia Owens, female    DOB: 1975-10-16, 45 y.o.   MRN: [id number]  HPI Pt here for 3 month follow up. Pt is doing well on all meds. Since taking Buspar, pt has not had to take as many Xanax. Pt would like to talk to provider about Omeprazole.   She relates her moods are doing well.  Anxiety under good control.  Rarely uses Xanax.  Reflux overall seems to be doing well with some intermittent spells depending on when she takes her omeprazole. No severe abdominal pains.  Energy level overall doing well tries to get adequate rest stress levels are reasonable Virtual Visit via Telephone Note  I connected with Crissie Figures on 10/12/20 at  8:20 AM EST by telephone and verified that I am speaking with the correct person using two identifiers.  Location: Patient: home Provider: office   I discussed the limitations, risks, security and privacy concerns of performing an evaluation and management service by telephone and the availability of in person appointments. I also discussed with the patient that there may be a patient responsible charge related to this service. The patient expressed understanding and agreed to proceed.   History of Present Illness:    Observations/Objective:   Assessment and Plan:   Follow Up Instructions:    I discussed the assessment and treatment plan with the patient. The patient was provided an opportunity to ask questions and all were answered. The patient agreed with the plan and demonstrated an understanding of the instructions.   The patient was advised to call back or seek an in-person evaluation if the symptoms worsen or if the condition fails to improve as anticipated.  I provided 20 minutes of non-face-to-face time during this encounter.      Review of Systems Patient does relate intermittent reflux issues with burning in her throat and in the back of her throat when she does not take her omeprazole she denies any  chest tightness pressure pain moods are doing well.    Objective:   Physical Exam  Today's visit was via telephone Physical exam was not possible for this visit  Patient keeps up on her female health checkups she does do her mammogram she will make sure we get a copy of that sent to [place]  Also patient was advised to get colonoscopy age 89 she had one approximately 10 years ago     Assessment & Plan:  1. Primary hypertension Patient believes blood pressure under good control watching diet staying active continue medication  2. Moderate persistent asthma, unspecified whether complicated Breathing is been doing very well with no setbacks or problems recently  3. ANXIETY DEPRESSION Anxiety depression doing very well BuSpar working well uses Xanax rarely continues to Celexa  4. Gastroesophageal reflux disease without esophagitis Patient very interested in getting away from omeprazole and PPIs because of increased risk of potential dementia and a family history of dementia therefore she would like to try H2 blocker we did discuss this.  She will give [place] an update in 3 to 4 weeks how this is going.  We will go with famotidine 40 mg daily.  Patient will do the lab work which was ordered in November  She is not due for iron evaluation until June  Follow-up in approximately 4 to 5 months

## 2020-10-12 NOTE — Telephone Encounter (Signed)
Ms. michaelene, dutan are scheduled for a virtual visit with your provider today.    Just as we do with appointments in the office, we must obtain your consent to participate.  Your consent will be active for this visit and any virtual visit you may have with one of our providers in the next 365 days.    If you have a MyChart account, I can also send a copy of this consent to you electronically.  All virtual visits are billed to your insurance company just like a traditional visit in the office.  As this is a virtual visit, video technology does not allow for your provider to perform a traditional examination.  This may limit your provider's ability to fully assess your condition.  If your provider identifies any concerns that need to be evaluated in person or the need to arrange testing such as labs, EKG, etc, we will make arrangements to do so.    Although advances in technology are sophisticated, we cannot ensure that it will always work on either your end or our end.  If the connection with a video visit is poor, we may have to switch to a telephone visit.  With either a video or telephone visit, we are not always able to ensure that we have a secure connection.   I need to obtain your verbal consent now.   Are you willing to proceed with your visit today?   MIARA EMMINGER has provided verbal consent on 10/12/2020 for a virtual visit (video or telephone).   Marlowe Shores, LPN 11/09/8586  5:02 AM

## 2020-10-29 DIAGNOSIS — I10 Essential (primary) hypertension: Secondary | ICD-10-CM | POA: Diagnosis not present

## 2020-10-29 DIAGNOSIS — R7303 Prediabetes: Secondary | ICD-10-CM | POA: Diagnosis not present

## 2020-10-29 DIAGNOSIS — E785 Hyperlipidemia, unspecified: Secondary | ICD-10-CM | POA: Diagnosis not present

## 2020-10-29 DIAGNOSIS — Z79899 Other long term (current) drug therapy: Secondary | ICD-10-CM | POA: Diagnosis not present

## 2020-10-30 LAB — BASIC METABOLIC PANEL
BUN/Creatinine Ratio: 8 — ABNORMAL LOW (ref 9–23)
BUN: 8 mg/dL (ref 6–24)
CO2: 23 mmol/L (ref 20–29)
Calcium: 9.6 mg/dL (ref 8.7–10.2)
Chloride: 96 mmol/L (ref 96–106)
Creatinine, Ser: 0.95 mg/dL (ref 0.57–1.00)
GFR calc Af Amer: 84 mL/min/{1.73_m2} (ref >59)
GFR calc non Af Amer: 73 mL/min/{1.73_m2} (ref >59)
Glucose: 104 mg/dL — ABNORMAL HIGH (ref 65–99)
Potassium: 3.8 mmol/L (ref 3.5–5.2)
Sodium: 138 mmol/L (ref 134–144)

## 2020-10-30 LAB — HEPATIC FUNCTION PANEL
ALT: 31 IU/L (ref 0–32)
AST: 32 IU/L (ref 0–40)
Albumin: 4.7 g/dL (ref 3.8–4.8)
Alkaline Phosphatase: 81 IU/L (ref 44–121)
Bilirubin Total: 0.4 mg/dL (ref 0.0–1.2)
Bilirubin, Direct: 0.11 mg/dL (ref 0.00–0.40)
Total Protein: 7.6 g/dL (ref 6.0–8.5)

## 2020-10-30 LAB — HEMOGLOBIN A1C
Est. average glucose Bld gHb Est-mCnc: 126 mg/dL
Hgb A1c MFr Bld: 6 % — ABNORMAL HIGH (ref 4.8–5.6)

## 2020-10-30 LAB — LIPID PANEL
Chol/HDL Ratio: 4.2 ratio (ref 0.0–4.4)
Cholesterol, Total: 241 mg/dL — ABNORMAL HIGH (ref 100–199)
HDL: 57 mg/dL (ref >39)
LDL Chol Calc (NIH): 161 mg/dL — ABNORMAL HIGH (ref 0–99)
Triglycerides: 129 mg/dL (ref 0–149)
VLDL Cholesterol Cal: 23 mg/dL (ref 5–40)

## 2020-11-24 DIAGNOSIS — Z1231 Encounter for screening mammogram for malignant neoplasm of breast: Secondary | ICD-10-CM | POA: Diagnosis not present

## 2020-11-24 DIAGNOSIS — N940 Mittelschmerz: Secondary | ICD-10-CM | POA: Diagnosis not present

## 2020-11-24 DIAGNOSIS — Z01419 Encounter for gynecological examination (general) (routine) without abnormal findings: Secondary | ICD-10-CM | POA: Diagnosis not present

## 2020-11-24 DIAGNOSIS — E669 Obesity, unspecified: Secondary | ICD-10-CM | POA: Diagnosis not present

## 2020-11-24 DIAGNOSIS — Z719 Counseling, unspecified: Secondary | ICD-10-CM | POA: Diagnosis not present

## 2020-12-21 ENCOUNTER — Encounter: Payer: Self-pay | Admitting: Family Medicine

## 2020-12-21 DIAGNOSIS — R1110 Vomiting, unspecified: Secondary | ICD-10-CM

## 2020-12-22 NOTE — Telephone Encounter (Signed)
Nurses Please move forward with referral to pediatric gastroenterology.  I believe either provider would be able to provide good care-I am perfectly fine with the PA seeing Alicia Owens Reason for referral is intermittent vomiting Please let [name] know that I did review her note thank you

## 2020-12-22 NOTE — Addendum Note (Signed)
Addended by: Marlowe Shores on: 12/22/2020 10:51 AM   Modules accepted: Orders

## 2021-01-15 DIAGNOSIS — N3090 Cystitis, unspecified without hematuria: Secondary | ICD-10-CM | POA: Diagnosis not present

## 2021-01-15 DIAGNOSIS — N3021 Other chronic cystitis with hematuria: Secondary | ICD-10-CM | POA: Diagnosis not present

## 2021-01-15 DIAGNOSIS — R319 Hematuria, unspecified: Secondary | ICD-10-CM | POA: Diagnosis not present

## 2021-02-15 ENCOUNTER — Encounter: Payer: Self-pay | Admitting: Family Medicine

## 2021-02-15 DIAGNOSIS — R070 Pain in throat: Secondary | ICD-10-CM | POA: Diagnosis not present

## 2021-02-15 DIAGNOSIS — R350 Frequency of micturition: Secondary | ICD-10-CM | POA: Diagnosis not present

## 2021-02-15 DIAGNOSIS — N390 Urinary tract infection, site not specified: Secondary | ICD-10-CM | POA: Diagnosis not present

## 2021-02-15 NOTE — Telephone Encounter (Signed)
Nurses Unfortunately with being the only provider this week options are limited. Please connect with [facility]. 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

## 2021-02-15 NOTE — Telephone Encounter (Signed)
Patient notified and stated she would go to urgent care today for evaluation and treatment.

## 2021-02-23 ENCOUNTER — Telehealth: Payer: Self-pay | Admitting: Family Medicine

## 2021-02-23 NOTE — Telephone Encounter (Signed)
Patient is wanting office visit due to stomach issues ,sorethroat went to urgent care on 6/14 not any better and would like follow up visit. Please review notes from urgent care had negative  Covid test, negative strep and has UTI. Please advise

## 2021-02-23 NOTE — Telephone Encounter (Signed)
3:30 on thursday

## 2021-02-24 ENCOUNTER — Ambulatory Visit: Payer: BC Managed Care – PPO | Admitting: Family Medicine

## 2021-02-24 ENCOUNTER — Other Ambulatory Visit: Payer: Self-pay

## 2021-02-24 VITALS — BP 140/87 | HR 73 | Temp 97.3°F | Wt 161.6 lb

## 2021-02-24 DIAGNOSIS — J019 Acute sinusitis, unspecified: Secondary | ICD-10-CM

## 2021-02-24 MED ORDER — AMOXICILLIN 500 MG PO TABS: 500.0000 mg | ORAL_TABLET | Freq: Three times a day (TID) | ORAL | 0 refills | Status: DC

## 2021-02-24 MED ORDER — BENZONATATE 200 MG PO CAPS: 200.0000 mg | ORAL_CAPSULE | Freq: Three times a day (TID) | ORAL | 1 refills | Status: DC | PRN

## 2021-02-24 NOTE — Progress Notes (Signed)
   Subjective:    Patient ID: Alicia Owens, female    DOB: [birth year], 45 y.o.   MRN: [id number]  HPI  Patient arrives for a follow up from recent trip to urgent care for sore throat and cough and congestion. Patient states she was diagnosed with UTI and given Macrobid and told the sore throat and URI was viral. The UTI is better but still has sore throat and cough for last 2 weeks.    Started 2 weeks ago Micah Flesher to urgent care on the 14th June Some cough then Had uti Macrobid Home covid negative They felt cough viral No fever No wheezing No albuterol use Voice comes and goes No sweats but had some chills but no fever Appetite ok Energy very low  No one else sick at home Cough happens at night as well  Review of Systems     Objective:   Physical Exam General-in no acute distress Eyes-no discharge Lungs-respiratory rate normal, CTA CV-no murmurs,RRR Extremities skin warm dry no edema Neuro grossly normal Behavior normal, alert Tm nl T nl Neck no masses  Tonsillar area irritated yet discolored drainage down the back of throat     Assessment & Plan:  1. Acute rhinosinusitis Antibiotic prescribed warning signs discussed Should gradually get better hold off on any type of x-rays lab work currently follow-up if any ongoing troubles

## 2021-02-24 NOTE — Telephone Encounter (Signed)
Left message for a return call . Drs notes ok'd to add her on at 3:30 today.

## 2021-02-25 ENCOUNTER — Other Ambulatory Visit: Payer: Self-pay | Admitting: Family Medicine

## 2021-03-27 DIAGNOSIS — R300 Dysuria: Secondary | ICD-10-CM | POA: Diagnosis not present

## 2021-03-27 DIAGNOSIS — R399 Unspecified symptoms and signs involving the genitourinary system: Secondary | ICD-10-CM | POA: Diagnosis not present

## 2021-03-27 DIAGNOSIS — B029 Zoster without complications: Secondary | ICD-10-CM | POA: Diagnosis not present

## 2021-04-12 ENCOUNTER — Encounter: Payer: Self-pay | Admitting: Family Medicine

## 2021-04-12 ENCOUNTER — Ambulatory Visit: Payer: BC Managed Care – PPO | Admitting: Family Medicine

## 2021-04-12 ENCOUNTER — Other Ambulatory Visit: Payer: Self-pay

## 2021-04-12 VITALS — BP 128/72 | Temp 97.9°F | Wt 161.6 lb

## 2021-04-12 DIAGNOSIS — R1319 Other dysphagia: Secondary | ICD-10-CM

## 2021-04-12 DIAGNOSIS — J4530 Mild persistent asthma, uncomplicated: Secondary | ICD-10-CM | POA: Diagnosis not present

## 2021-04-12 DIAGNOSIS — Z1211 Encounter for screening for malignant neoplasm of colon: Secondary | ICD-10-CM

## 2021-04-12 DIAGNOSIS — J4540 Moderate persistent asthma, uncomplicated: Secondary | ICD-10-CM

## 2021-04-12 DIAGNOSIS — R7303 Prediabetes: Secondary | ICD-10-CM

## 2021-04-12 DIAGNOSIS — I10 Essential (primary) hypertension: Secondary | ICD-10-CM

## 2021-04-12 DIAGNOSIS — N390 Urinary tract infection, site not specified: Secondary | ICD-10-CM | POA: Diagnosis not present

## 2021-04-12 DIAGNOSIS — K2100 Gastro-esophageal reflux disease with esophagitis, without bleeding: Secondary | ICD-10-CM

## 2021-04-12 DIAGNOSIS — E785 Hyperlipidemia, unspecified: Secondary | ICD-10-CM

## 2021-04-12 MED ORDER — TOPIRAMATE 50 MG PO TABS: ORAL_TABLET | ORAL | 1 refills | Status: DC

## 2021-04-12 MED ORDER — POTASSIUM CHLORIDE ER 10 MEQ PO TBCR: EXTENDED_RELEASE_TABLET | ORAL | 1 refills | Status: DC

## 2021-04-12 MED ORDER — FLUTICASONE PROPIONATE HFA 220 MCG/ACT IN AERO: INHALATION_SPRAY | RESPIRATORY_TRACT | 5 refills | Status: DC

## 2021-04-12 MED ORDER — CEPHALEXIN 500 MG PO CAPS: ORAL_CAPSULE | ORAL | 0 refills | Status: DC

## 2021-04-12 MED ORDER — HYDROCHLOROTHIAZIDE 25 MG PO TABS: ORAL_TABLET | ORAL | 1 refills | Status: DC

## 2021-04-12 MED ORDER — CITALOPRAM HYDROBROMIDE 20 MG PO TABS: 20.0000 mg | ORAL_TABLET | Freq: Every day | ORAL | 1 refills | Status: DC

## 2021-04-12 MED ORDER — ALBUTEROL SULFATE HFA 108 (90 BASE) MCG/ACT IN AERS: 2.0000 | INHALATION_SPRAY | Freq: Four times a day (QID) | RESPIRATORY_TRACT | 5 refills | Status: DC | PRN

## 2021-04-12 MED ORDER — MONTELUKAST SODIUM 10 MG PO TABS: ORAL_TABLET | ORAL | 1 refills | Status: DC

## 2021-04-12 MED ORDER — SUCRALFATE 1 G PO TABS: ORAL_TABLET | ORAL | 4 refills | Status: DC

## 2021-04-12 MED ORDER — BUSPIRONE HCL 15 MG PO TABS: ORAL_TABLET | ORAL | 3 refills | Status: DC

## 2021-04-12 NOTE — Progress Notes (Signed)
   Subjective:    Patient ID: Alicia Owens, female    DOB: 07-17-1976, 45 y.o.   MRN: [id number]  HPI Pt here for follow upon HTN. Pt states blood pressure has been good. Pt checking blood pressure at least once every 2 weeks. Pt states she has been having repeat UTIs and has had to go to Urgent Care due to them being on weekends and BP there is good.   Primary hypertension - Plan: Basic Metabolic Panel (BMET), Lipid Profile, Hemoglobin A1c  Frequent UTI  Mild persistent reactive airway disease without complication  Hyperlipidemia, unspecified hyperlipidemia type - Plan: Basic Metabolic Panel (BMET), Lipid Profile, Hemoglobin A1c  Prediabetes - Plan: Basic Metabolic Panel (BMET), Lipid Profile, Hemoglobin A1c  Gastroesophageal reflux disease with esophagitis without hemorrhage  Esophageal dysphagia  Moderate persistent asthma, unspecified whether complicated - Plan: albuterol (VENTOLIN HFA) 108 (90 Base) MCG/ACT inhaler  Encounter for screening colonoscopy - Plan: Ambulatory referral to Gastroenterology  Patient does have frequent UTIs.  She does request a short prescription of antibiotics to be able to use if she should have a UTI between now and when she has an appointment with urology.  She states this is in several weeks  She does take her cholesterol medicine watches her diet tries to stay healthy and active  Patient has been able to lose weight minimizing starches recheck A1c  1 time per day an inhaler sterile ux issues despite utilizing medicine in addition to this she states occasionally has dysphagia  Her asthma has been stable she uses steroid inhaler once daily Review of Systems     Objective:   Physical Exam  General-in no acute distress Eyes-no discharge Lungs-respiratory rate normal, CTA CV-no murmurs,RRR Extremities skin warm dry no edema Neuro grossly normal Behavior normal, alert       Assessment & Plan:   1. Primary hypertension Blood pressure  decent control watch diet continue medication check lab work - Psychologist, clinical Panel (BMET) - Lipid Profile - Hemoglobin A1c  2. Frequent UTI Frequent UTI, antibiotic prescribed, follow through with urology, follow-up sooner if any problems  3. Mild persistent reactive airway disease without complication Continue steroid inhaler once daily  4. Hyperlipidemia, unspecified hyperlipidemia type Check cholesterol profile continue healthy diet watch diet stay active - Basic Metabolic Panel (BMET) - Lipid Profile - Hemoglobin A1c  5. Prediabetes Minimize starches stay active check A1c - Basic Metabolic Panel (BMET) - Lipid Profile - Hemoglobin A1c  6. Gastroesophageal reflux disease with esophagitis without hemorrhage Continue PPI may use Carafate when necessary see gastroenterology would benefit from EGD  7. Esophageal dysphagia Would benefit from EGD  8. Moderate persistent asthma, unspecified whether complicated Albuterol as needed Flovent 1 inhaler/inhalation once daily - albuterol (VENTOLIN HFA) 108 (90 Base) MCG/ACT inhaler; Inhale 2 puffs into the lungs every 6 (six) hours as needed for wheezing or shortness of breath.  Dispense: 18 g; Refill: 5  9. Encounter for screening colonoscopy Referral to Dr. Renae Fickle for colonoscopy and EGD because of dysphagia - Ambulatory referral to Gastroenterology  Anxiety levels doing very good not using Xanax but still taking citalopram benefiting would like to continue.  Recheck patient in 6 months

## 2021-04-18 ENCOUNTER — Encounter (INDEPENDENT_AMBULATORY_CARE_PROVIDER_SITE_OTHER): Payer: Self-pay | Admitting: *Deleted

## 2021-04-21 DIAGNOSIS — N6452 Nipple discharge: Secondary | ICD-10-CM | POA: Diagnosis not present

## 2021-04-21 DIAGNOSIS — Z719 Counseling, unspecified: Secondary | ICD-10-CM | POA: Diagnosis not present

## 2021-04-27 DIAGNOSIS — R928 Other abnormal and inconclusive findings on diagnostic imaging of breast: Secondary | ICD-10-CM | POA: Diagnosis not present

## 2021-05-30 DIAGNOSIS — N6452 Nipple discharge: Secondary | ICD-10-CM | POA: Diagnosis not present

## 2021-05-31 ENCOUNTER — Other Ambulatory Visit: Payer: Self-pay | Admitting: Family Medicine

## 2021-06-06 ENCOUNTER — Encounter: Payer: Self-pay | Admitting: Family Medicine

## 2021-06-06 DIAGNOSIS — I10 Essential (primary) hypertension: Secondary | ICD-10-CM

## 2021-06-06 DIAGNOSIS — E785 Hyperlipidemia, unspecified: Secondary | ICD-10-CM

## 2021-06-06 DIAGNOSIS — R7303 Prediabetes: Secondary | ICD-10-CM

## 2021-06-06 DIAGNOSIS — Z79899 Other long term (current) drug therapy: Secondary | ICD-10-CM

## 2021-06-06 DIAGNOSIS — R5383 Other fatigue: Secondary | ICD-10-CM

## 2021-06-07 NOTE — Telephone Encounter (Signed)
Lipid, liver, metabolic 7, A1c, CBC, TSH  Prediabetes, hyperlipidemia, other fatigue, HTN  Follow-up based around lab work

## 2021-06-07 NOTE — Addendum Note (Signed)
Addended by: Margaretha Sheffield on: 06/07/2021 10:38 AM   Modules accepted: Orders

## 2021-06-22 DIAGNOSIS — N62 Hypertrophy of breast: Secondary | ICD-10-CM | POA: Diagnosis not present

## 2021-06-22 DIAGNOSIS — Z885 Allergy status to narcotic agent status: Secondary | ICD-10-CM | POA: Diagnosis not present

## 2021-06-22 DIAGNOSIS — R7303 Prediabetes: Secondary | ICD-10-CM | POA: Diagnosis not present

## 2021-06-22 DIAGNOSIS — J45909 Unspecified asthma, uncomplicated: Secondary | ICD-10-CM | POA: Diagnosis not present

## 2021-06-22 DIAGNOSIS — Z79899 Other long term (current) drug therapy: Secondary | ICD-10-CM | POA: Diagnosis not present

## 2021-06-22 DIAGNOSIS — N6452 Nipple discharge: Secondary | ICD-10-CM | POA: Diagnosis not present

## 2021-06-22 DIAGNOSIS — Z888 Allergy status to other drugs, medicaments and biological substances status: Secondary | ICD-10-CM | POA: Diagnosis not present

## 2021-06-22 DIAGNOSIS — Z882 Allergy status to sulfonamides status: Secondary | ICD-10-CM | POA: Diagnosis not present

## 2021-06-22 DIAGNOSIS — F419 Anxiety disorder, unspecified: Secondary | ICD-10-CM | POA: Diagnosis not present

## 2021-06-22 DIAGNOSIS — D241 Benign neoplasm of right breast: Secondary | ICD-10-CM | POA: Diagnosis not present

## 2021-06-22 DIAGNOSIS — E785 Hyperlipidemia, unspecified: Secondary | ICD-10-CM | POA: Diagnosis not present

## 2021-06-22 DIAGNOSIS — I10 Essential (primary) hypertension: Secondary | ICD-10-CM | POA: Diagnosis not present

## 2021-06-23 LAB — CBC WITH DIFFERENTIAL/PLATELET
Basophils Absolute: 0.1 10*3/uL (ref 0.0–0.2)
Basos: 0 %
EOS (ABSOLUTE): 0.6 10*3/uL — ABNORMAL HIGH (ref 0.0–0.4)
Eos: 4 %
Hematocrit: 42.5 % (ref 34.0–46.6)
Hemoglobin: 14.6 g/dL (ref 11.1–15.9)
Immature Grans (Abs): 0 10*3/uL (ref 0.0–0.1)
Immature Granulocytes: 0 %
Lymphocytes Absolute: 3.2 10*3/uL — ABNORMAL HIGH (ref 0.7–3.1)
Lymphs: 24 %
MCH: 31.4 pg (ref 26.6–33.0)
MCHC: 34.4 g/dL (ref 31.5–35.7)
MCV: 91 fL (ref 79–97)
Monocytes Absolute: 0.7 10*3/uL (ref 0.1–0.9)
Monocytes: 5 %
Neutrophils Absolute: 8.9 10*3/uL — ABNORMAL HIGH (ref 1.4–7.0)
Neutrophils: 67 %
Platelets: 391 10*3/uL (ref 150–450)
RBC: 4.65 x10E6/uL (ref 3.77–5.28)
RDW: 11.8 % (ref 11.7–15.4)
WBC: 13.5 10*3/uL — ABNORMAL HIGH (ref 3.4–10.8)

## 2021-06-23 LAB — HEPATIC FUNCTION PANEL
ALT: 25 IU/L (ref 0–32)
AST: 21 IU/L (ref 0–40)
Albumin: 4.7 g/dL (ref 3.8–4.8)
Alkaline Phosphatase: 73 IU/L (ref 44–121)
Bilirubin Total: 0.3 mg/dL (ref 0.0–1.2)
Bilirubin, Direct: 0.1 mg/dL (ref 0.00–0.40)
Total Protein: 7.7 g/dL (ref 6.0–8.5)

## 2021-06-23 LAB — LIPID PANEL
Chol/HDL Ratio: 4.8 ratio — ABNORMAL HIGH (ref 0.0–4.4)
Cholesterol, Total: 247 mg/dL — ABNORMAL HIGH (ref 100–199)
HDL: 52 mg/dL (ref >39)
LDL Chol Calc (NIH): 157 mg/dL — ABNORMAL HIGH (ref 0–99)
Triglycerides: 207 mg/dL — ABNORMAL HIGH (ref 0–149)
VLDL Cholesterol Cal: 38 mg/dL (ref 5–40)

## 2021-06-23 LAB — BASIC METABOLIC PANEL
BUN/Creatinine Ratio: 10 (ref 9–23)
BUN: 9 mg/dL (ref 6–24)
CO2: 23 mmol/L (ref 20–29)
Calcium: 9.5 mg/dL (ref 8.7–10.2)
Chloride: 97 mmol/L (ref 96–106)
Creatinine, Ser: 0.91 mg/dL (ref 0.57–1.00)
Glucose: 108 mg/dL — ABNORMAL HIGH (ref 70–99)
Potassium: 4.1 mmol/L (ref 3.5–5.2)
Sodium: 138 mmol/L (ref 134–144)
eGFR: 79 mL/min/{1.73_m2} (ref >59)

## 2021-06-23 LAB — TSH: TSH: 0.981 u[IU]/mL (ref 0.450–4.500)

## 2021-06-23 LAB — HEMOGLOBIN A1C
Est. average glucose Bld gHb Est-mCnc: 134 mg/dL
Hgb A1c MFr Bld: 6.3 % — ABNORMAL HIGH (ref 4.8–5.6)

## 2021-06-28 ENCOUNTER — Telehealth: Payer: Self-pay | Admitting: Family Medicine

## 2021-06-28 NOTE — Addendum Note (Signed)
Addended by: Margaretha Sheffield on: 06/28/2021 10:36 AM   Modules accepted: Orders

## 2021-06-28 NOTE — Telephone Encounter (Signed)
Patient having right breast duct excision labs faxed per request

## 2021-06-28 NOTE — Telephone Encounter (Signed)
Alicia Owens with [facility] in [place] stated patient will be having surgery tomorrow and wanted recent lab work faxed to [phone]  CB#  [phone]

## 2021-06-29 DIAGNOSIS — D241 Benign neoplasm of right breast: Secondary | ICD-10-CM | POA: Diagnosis not present

## 2021-06-29 DIAGNOSIS — Z888 Allergy status to other drugs, medicaments and biological substances status: Secondary | ICD-10-CM | POA: Diagnosis not present

## 2021-06-29 DIAGNOSIS — J45909 Unspecified asthma, uncomplicated: Secondary | ICD-10-CM | POA: Diagnosis not present

## 2021-06-29 DIAGNOSIS — N62 Hypertrophy of breast: Secondary | ICD-10-CM | POA: Diagnosis not present

## 2021-06-29 DIAGNOSIS — I10 Essential (primary) hypertension: Secondary | ICD-10-CM | POA: Diagnosis not present

## 2021-06-29 DIAGNOSIS — Z885 Allergy status to narcotic agent status: Secondary | ICD-10-CM | POA: Diagnosis not present

## 2021-06-29 DIAGNOSIS — F419 Anxiety disorder, unspecified: Secondary | ICD-10-CM | POA: Diagnosis not present

## 2021-06-29 DIAGNOSIS — N6452 Nipple discharge: Secondary | ICD-10-CM | POA: Diagnosis not present

## 2021-06-29 DIAGNOSIS — Z79899 Other long term (current) drug therapy: Secondary | ICD-10-CM | POA: Diagnosis not present

## 2021-06-29 DIAGNOSIS — Z882 Allergy status to sulfonamides status: Secondary | ICD-10-CM | POA: Diagnosis not present

## 2021-06-30 ENCOUNTER — Other Ambulatory Visit: Payer: Self-pay | Admitting: Family Medicine

## 2021-07-06 ENCOUNTER — Other Ambulatory Visit: Payer: Self-pay | Admitting: Family Medicine

## 2021-07-21 DIAGNOSIS — L578 Other skin changes due to chronic exposure to nonionizing radiation: Secondary | ICD-10-CM | POA: Diagnosis not present

## 2021-07-21 DIAGNOSIS — L814 Other melanin hyperpigmentation: Secondary | ICD-10-CM | POA: Diagnosis not present

## 2021-07-21 DIAGNOSIS — Z85828 Personal history of other malignant neoplasm of skin: Secondary | ICD-10-CM | POA: Diagnosis not present

## 2021-07-21 DIAGNOSIS — D2262 Melanocytic nevi of left upper limb, including shoulder: Secondary | ICD-10-CM | POA: Diagnosis not present

## 2021-07-21 DIAGNOSIS — N390 Urinary tract infection, site not specified: Secondary | ICD-10-CM | POA: Diagnosis not present

## 2021-07-21 DIAGNOSIS — R350 Frequency of micturition: Secondary | ICD-10-CM | POA: Diagnosis not present

## 2021-08-01 ENCOUNTER — Other Ambulatory Visit: Payer: Self-pay | Admitting: Family Medicine

## 2021-08-11 ENCOUNTER — Other Ambulatory Visit: Payer: Self-pay

## 2021-08-11 ENCOUNTER — Ambulatory Visit
Admission: RE | Admit: 2021-08-11 | Discharge: 2021-08-11 | Disposition: A | Discharge status: Home / Self Care | Payer: BC Managed Care – PPO | Source: Ambulatory Visit | Attending: Emergency Medicine | Admitting: Emergency Medicine

## 2021-08-11 VITALS — BP 137/87 | HR 94 | Temp 98.7°F | Resp 20

## 2021-08-11 DIAGNOSIS — K5732 Diverticulitis of large intestine without perforation or abscess without bleeding: Secondary | ICD-10-CM

## 2021-08-11 MED ORDER — FLUCONAZOLE 150 MG PO TABS: 150.0000 mg | ORAL_TABLET | Freq: Once | ORAL | 1 refills | Status: AC

## 2021-08-11 MED ORDER — AMOXICILLIN-POT CLAVULANATE 875-125 MG PO TABS: 1.0000 | ORAL_TABLET | Freq: Three times a day (TID) | ORAL | 0 refills | Status: AC

## 2021-08-11 MED ORDER — IBUPROFEN 600 MG PO TABS: 600.0000 mg | ORAL_TABLET | Freq: Four times a day (QID) | ORAL | 0 refills | Status: DC | PRN

## 2021-08-11 NOTE — ED Provider Notes (Signed)
HPI  SUBJECTIVE:  Alicia Owens is a 45 y.o. female who presents with 4 days of constant, achy, nonmigratory, nonradiating left lower quadrant pain.  No nausea, vomiting, fevers, distention, back pain, urinary or vaginal complaints.  She had a normal bowel movement today.  The car ride over here was not painful.  She states this is identical to previous diverticulitis flares.  She has been taking Tylenol 650 mg every 5 hours with improvement in her symptoms.  Last dose was within 6 hours of evaluation.  No aggravating factors.  Is not associated eating, drinking, urination, defecation or movement.  She has a past medical history of diverticulitis, gestational hypertension/preeclampsia, UTI, status post appendectomy, cholecystectomy.  No history of diabetes, pyelonephritis, nephrolithiasis, STD, PID, BV, ovarian cysts. LMP: 11/25.  Denies the possibility being pregnant.  HQR:[id number], Jonna Coup, MD   Past Medical History:  Diagnosis Date   Anemia    Anxiety    Chronic abdominal pain    Depression    Gastroparesis    GERD (gastroesophageal reflux disease)    Headache(784.0)    Hypertension    Hyperthyroidism 2011   IBS (irritable bowel syndrome)    Iron deficiency anemia, unspecified 10/05/2019   More than likely this is due to heavy cycles.  Patient has ongoing history of heavy cycles.  IFBOT - January 2021   Menorrhagia 10/05/2019   PONV (postoperative nausea and vomiting)    PP care - C/S 12/17 08/22/2011    Past Surgical History:  Procedure Laterality Date   APPENDECTOMY     BASAL CELL CARCINOMA EXCISION  02/2006   face   BREAST SURGERY     Breast reduction   CESAREAN SECTION  2007 and 2012   x2   CHOLECYSTECTOMY  12/15/2011   Procedure: LAPAROSCOPIC CHOLECYSTECTOMY;  Surgeon: Dalia Heading, MD;  Location: [facility];  Service: General;  Laterality: N/A;   COLONOSCOPY     ESOPHAGOGASTRODUODENOSCOPY     TUBAL LIGATION     with last c-section   WISDOM TOOTH EXTRACTION  08/2003     Family History  Problem Relation Age of Onset   Healthy Daughter    Healthy Son    Hypertension Mother    Hypertension Father    Hyperlipidemia Father    Hyperlipidemia Other    Hypertension Other    Anesthesia problems Neg Hx    Hypotension Neg Hx    Malignant hyperthermia Neg Hx    Pseudochol deficiency Neg Hx     Social History   Tobacco Use   Smoking status: Never   Smokeless tobacco: Never  Vaping Use   Vaping Use: Never used  Substance Use Topics   Alcohol use: No   Drug use: No    No current facility-administered medications for this encounter.  Current Outpatient Medications:    amoxicillin-clavulanate (AUGMENTIN) 875-125 MG tablet, Take 1 tablet by mouth 3 (three) times daily for 7 days., Disp: 21 tablet, Rfl: 0   fluconazole (DIFLUCAN) 150 MG tablet, Take 1 tablet (150 mg total) by mouth once for 1 dose. 1 tab po x 1. May repeat in 72 hours if no improvement, Disp: 2 tablet, Rfl: 1   ibuprofen (ADVIL) 600 MG tablet, Take 1 tablet (600 mg total) by mouth every 6 (six) hours as needed., Disp: 30 tablet, Rfl: 0   SUMAtriptan (IMITREX) 100 MG tablet, TAKE ONE TABLET BY MOUTH AT ONSET OF HEADACHE. MAY REPEAT ONE TIME IN TWO HOURS IF NO RELIEF. DO NOT EXCEED  TWO TABLETS IN 24 HOURS., Disp: 9 tablet, Rfl: 5   albuterol (VENTOLIN HFA) 108 (90 Base) MCG/ACT inhaler, Inhale 2 puffs into the lungs every 6 (six) hours as needed for wheezing or shortness of breath., Disp: 18 g, Rfl: 5   ALPRAZolam (XANAX) 0.25 MG tablet, TAKE ONE TABLET BY MOUTH TWICE DAILY AS NEEDED, Disp: 30 tablet, Rfl: 2   busPIRone (BUSPAR) 15 MG tablet, TAKE ONE (1) TABLET TWICE  EACH DAY, Disp: 180 tablet, Rfl: 3   citalopram (CELEXA) 20 MG tablet, Take 1 tablet (20 mg total) by mouth daily., Disp: 90 tablet, Rfl: 1   CRANBERRY PO, Take by mouth., Disp: , Rfl:    cyclobenzaprine (FLEXERIL) 10 MG tablet, TAKE ONE (1) TABLET BY MOUTH 3 TIMES DAILY AS NEEDED (BETWEEN MEALS AND AT BEDTIME), Disp: 30  tablet, Rfl: 4   dicyclomine (BENTYL) 20 MG tablet, TAKE ONE TABLET BY MOUTH THREE TIMES DAILY AS NEEDED OR AS DIRECTED, Disp: 90 tablet, Rfl: 5   famotidine (PEPCID) 40 MG tablet, TAKE ONE TABLET (40MG  TOTAL) BY MOUTH DAILY, Disp: 30 tablet, Rfl: 5   fexofenadine (ALLEGRA) 180 MG tablet, Take 180 mg by mouth daily., Disp: , Rfl:    fluticasone (FLOVENT HFA) 220 MCG/ACT inhaler, INHALE 2 PUFFS INTO THE LUNGS TWICE A DAY, Disp: 12 g, Rfl: 5   hydrochlorothiazide (HYDRODIURIL) 25 MG tablet, 1 every morning, Disp: 90 tablet, Rfl: 1   Lactobacillus-Inulin ([place] DIGESTIVE HEALTH) CAPS, Take 1 capsule by mouth daily., Disp: , Rfl:    mometasone (NASONEX) 50 MCG/ACT nasal spray, Place 2 sprays into the nose as needed. , Disp: , Rfl:    montelukast (SINGULAIR) 10 MG tablet, 1 qd, Disp: 90 tablet, Rfl: 1   Multiple Vitamins-Minerals (MULTIVITAMIN WITH MINERALS) tablet, Take 1 tablet by mouth daily., Disp: , Rfl:    ondansetron (ZOFRAN) 8 MG tablet, TAKE ONE TABLET BY MOUTH THREE TO FOUR TIMES A DAY AS NEEDED, Disp: 30 tablet, Rfl: 5   potassium chloride (KLOR-CON) 10 MEQ tablet, TAKE ONE TABLET BY MOUTH ONCE A DAY AS NEEDED WITH HCTZ, Disp: 90 tablet, Rfl: 1   SUPER B COMPLEX/C PO, Take 1 tablet by mouth daily. , Disp: , Rfl:    SV CRANBERRY PO, Take 1 tablet by mouth daily. , Disp: , Rfl:    topiramate (TOPAMAX) 50 MG tablet, TAKE ONE TABLET BY MOUTH IN THE MORNING AND TWO TALBETS IN THE EVENING, Disp: 270 tablet, Rfl: 1   TURMERIC PO, Take 1 capsule by mouth daily. , Disp: , Rfl:    VITAMIN D, CHOLECALCIFEROL, PO, Take by mouth 3 (three) times a week., Disp: , Rfl:   Allergies  Allergen Reactions   Ciprofloxacin     Severe nausea and vomiting when combined with flagyl but not by itself   Codeine     REACTION: GI Upset, Vomiting   Flagyl [Metronidazole]     Severe nausea and vomiting   Promethazine Hcl     REACTION: Throat felt strange   Reglan [Metoclopramide]     twitching   Sulfonamide  Derivatives     REACTION: Rash     ROS  As noted in HPI.   Physical Exam  BP 137/87 (BP Location: Right Arm)   Pulse 94   Temp 98.7 F (37.1 C) (Oral)   Resp 20   LMP 07/29/2021   SpO2 98%   Constitutional: Well developed, well nourished, no acute distress Eyes:  EOMI, conjunctiva normal bilaterally HENT: Normocephalic, atraumatic,mucus membranes  moist Respiratory: Normal inspiratory effort Cardiovascular: Normal rate GI: nondistended normal appearance, soft, positive mild left lower quadrant tenderness to deep palpation.  No guarding, rebound.  Active bowel sounds. Back: No CVAT skin: No rash, skin intact Musculoskeletal: no deformities Neurologic: Alert & oriented x 3, no focal neuro deficits Psychiatric: Speech and behavior appropriate   ED Course   Medications - No data to display  No orders of the defined types were placed in this encounter.   No results found for this or any previous visit (from the past 24 hour(s)). No results found.  ED Clinical Impression  1. Diverticulitis of colon      ED Assessment/Plan  Pt abd exam is benign, no peritoneal signs. No evidence of surgical abd.   No evidence of obstruction, perforation.  Doubt ovarian torsion, TOA, PID.  She has no urinary complaints.  Doubt obstructing nephrolithiasis, UTI, pyelonephritis.  Presentation consistent with uncomplicated diverticulitis.  Home with Augmentin, Tylenol/ibuprofen.  Diflucan as she states she frequently gets yeast infections after antibiotics.  Strict ER return precautions given.  Discussed MDM, treatment plan, and plan for follow-up with patient. Discussed sn/sx that should prompt return to the ED. patient agrees with plan.   Meds ordered this encounter  Medications   amoxicillin-clavulanate (AUGMENTIN) 875-125 MG tablet    Sig: Take 1 tablet by mouth 3 (three) times daily for 7 days.    Dispense:  21 tablet    Refill:  0   fluconazole (DIFLUCAN) 150 MG tablet    Sig:  Take 1 tablet (150 mg total) by mouth once for 1 dose. 1 tab po x 1. May repeat in 72 hours if no improvement    Dispense:  2 tablet    Refill:  1   ibuprofen (ADVIL) 600 MG tablet    Sig: Take 1 tablet (600 mg total) by mouth every 6 (six) hours as needed.    Dispense:  30 tablet    Refill:  0      *This clinic note was created using Scientist, clinical (histocompatibility and immunogenetics). Therefore, there may be occasional mistakes despite careful proofreading.  ?    Domenick Gong, MD 08/12/21 [phone]

## 2021-08-11 NOTE — ED Triage Notes (Signed)
Abdominal pain started Monday night.  History of diverticulitis.  Patient states this feels like diverticulitis, patient very certain.  Denies nausea, vomiting or diarrhea.  Denies urinary symptoms.  Pain is in left lower abdomen.  Last bm was today and normal per patient.  Patient reports having menstrual cycles, lmp was 11/25

## 2021-08-11 NOTE — Discharge Instructions (Addendum)
Finish the antibiotics, even if you feel better.  May take 600 mg of ibuprofen combined with 1000 mg of Tylenol together 3-4 times a day as needed for pain.  Go immediately to the ER for fevers, abdominal distention, pain not controlled with Tylenol/ibuprofen, back pain, nausea/vomiting, or any other concerns.

## 2021-08-14 ENCOUNTER — Other Ambulatory Visit: Payer: Self-pay

## 2021-08-14 ENCOUNTER — Emergency Department (HOSPITAL_COMMUNITY): Payer: BC Managed Care – PPO

## 2021-08-14 ENCOUNTER — Encounter (HOSPITAL_COMMUNITY): Payer: Self-pay

## 2021-08-14 ENCOUNTER — Emergency Department (HOSPITAL_COMMUNITY)
Admission: EM | Admit: 2021-08-14 | Discharge: 2021-08-14 | Disposition: A | Discharge status: Home / Self Care | Payer: BC Managed Care – PPO | Attending: Emergency Medicine | Admitting: Emergency Medicine

## 2021-08-14 DIAGNOSIS — Z79899 Other long term (current) drug therapy: Secondary | ICD-10-CM | POA: Insufficient documentation

## 2021-08-14 DIAGNOSIS — I10 Essential (primary) hypertension: Secondary | ICD-10-CM | POA: Diagnosis not present

## 2021-08-14 DIAGNOSIS — R109 Unspecified abdominal pain: Secondary | ICD-10-CM

## 2021-08-14 DIAGNOSIS — R1032 Left lower quadrant pain: Secondary | ICD-10-CM | POA: Diagnosis not present

## 2021-08-14 LAB — CBC WITH DIFFERENTIAL/PLATELET
Abs Immature Granulocytes: 0.02 10*3/uL (ref 0.00–0.07)
Basophils Absolute: 0.1 10*3/uL (ref 0.0–0.1)
Basophils Relative: 1 %
Eosinophils Absolute: 0.4 10*3/uL (ref 0.0–0.5)
Eosinophils Relative: 4 %
HCT: 41.7 % (ref 36.0–46.0)
Hemoglobin: 14.5 g/dL (ref 12.0–15.0)
Immature Granulocytes: 0 %
Lymphocytes Relative: 35 %
Lymphs Abs: 3.2 10*3/uL (ref 0.7–4.0)
MCH: 31.4 pg (ref 26.0–34.0)
MCHC: 34.8 g/dL (ref 30.0–36.0)
MCV: 90.3 fL (ref 80.0–100.0)
Monocytes Absolute: 0.5 10*3/uL (ref 0.1–1.0)
Monocytes Relative: 6 %
Neutro Abs: 5 10*3/uL (ref 1.7–7.7)
Neutrophils Relative %: 54 %
Platelets: 386 10*3/uL (ref 150–400)
RBC: 4.62 MIL/uL (ref 3.87–5.11)
RDW: 12 % (ref 11.5–15.5)
WBC: 9.1 10*3/uL (ref 4.0–10.5)
nRBC: 0 % (ref 0.0–0.2)

## 2021-08-14 LAB — COMPREHENSIVE METABOLIC PANEL
ALT: 21 U/L (ref 0–44)
AST: 22 U/L (ref 15–41)
Albumin: 4 g/dL (ref 3.5–5.0)
Alkaline Phosphatase: 52 U/L (ref 38–126)
Anion gap: 10 (ref 5–15)
BUN: 12 mg/dL (ref 6–20)
CO2: 27 mmol/L (ref 22–32)
Calcium: 9 mg/dL (ref 8.9–10.3)
Chloride: 98 mmol/L (ref 98–111)
Creatinine, Ser: 0.86 mg/dL (ref 0.44–1.00)
GFR, Estimated: >60 mL/min (ref >60)
Glucose, Bld: 117 mg/dL — ABNORMAL HIGH (ref 70–99)
Potassium: 2.6 mmol/L — CL (ref 3.5–5.1)
Sodium: 135 mmol/L (ref 135–145)
Total Bilirubin: 0.2 mg/dL — ABNORMAL LOW (ref 0.3–1.2)
Total Protein: 7.9 g/dL (ref 6.5–8.1)

## 2021-08-14 LAB — URINALYSIS, ROUTINE W REFLEX MICROSCOPIC
Bilirubin Urine: NEGATIVE
Glucose, UA: NEGATIVE mg/dL
Hgb urine dipstick: NEGATIVE
Ketones, ur: NEGATIVE mg/dL
Leukocytes,Ua: NEGATIVE
Nitrite: NEGATIVE
Protein, ur: NEGATIVE mg/dL
Specific Gravity, Urine: 1.015 (ref 1.005–1.030)
pH: 8 (ref 5.0–8.0)

## 2021-08-14 LAB — LIPASE, BLOOD: Lipase: 56 U/L — ABNORMAL HIGH (ref 11–51)

## 2021-08-14 LAB — PREGNANCY, URINE: Preg Test, Ur: NEGATIVE

## 2021-08-14 MED ORDER — POTASSIUM CHLORIDE CRYS ER 20 MEQ PO TBCR
40.0000 meq | EXTENDED_RELEASE_TABLET | Freq: Once | ORAL | Status: AC
  Administered 2021-08-14: 40 meq via ORAL
  Filled 2021-08-14: qty 2

## 2021-08-14 MED ORDER — POTASSIUM CHLORIDE 10 MEQ/100ML IV SOLN
10.0000 meq | Freq: Once | INTRAVENOUS | Status: AC
  Administered 2021-08-14: 10 meq via INTRAVENOUS
  Filled 2021-08-14: qty 100

## 2021-08-14 MED ORDER — POTASSIUM CHLORIDE CRYS ER 20 MEQ PO TBCR: 20.0000 meq | EXTENDED_RELEASE_TABLET | Freq: Two times a day (BID) | ORAL | 0 refills | Status: DC

## 2021-08-14 MED ORDER — MAGNESIUM OXIDE -MG SUPPLEMENT 400 (240 MG) MG PO TABS
400.0000 mg | ORAL_TABLET | Freq: Once | ORAL | Status: AC
  Administered 2021-08-14: 400 mg via ORAL
  Filled 2021-08-14: qty 1

## 2021-08-14 MED ORDER — IOHEXOL 300 MG/ML  SOLN
100.0000 mL | Freq: Once | INTRAMUSCULAR | Status: AC | PRN
  Administered 2021-08-14: 100 mL via INTRAVENOUS

## 2021-08-14 MED ORDER — MORPHINE SULFATE (PF) 4 MG/ML IV SOLN
4.0000 mg | Freq: Once | INTRAVENOUS | Status: DC
  Filled 2021-08-14: qty 1

## 2021-08-14 MED ORDER — ONDANSETRON HCL 4 MG/2ML IJ SOLN
4.0000 mg | Freq: Once | INTRAMUSCULAR | Status: DC
  Filled 2021-08-14: qty 2

## 2021-08-14 NOTE — Discharge Instructions (Signed)
Call your primary care doctor or specialist as discussed in the next 2-3 days.   Return immediately back to the ER if:  Your symptoms worsen within the next 12-24 hours. You develop new symptoms such as new fevers, persistent vomiting, new pain, shortness of breath, or new weakness or numbness, or if you have any other concerns.  

## 2021-08-14 NOTE — ED Notes (Signed)
Went in to do rounding DR in with pt at this time

## 2021-08-14 NOTE — ED Triage Notes (Signed)
Patient reports LLQ pain that started last week. HX of Diverticulitis and states that pain is the same. Currently taking ABT, pain somewhat improved until this am. States that pain is radiating to back.

## 2021-08-14 NOTE — ED Provider Notes (Signed)
[facility] EMERGENCY DEPARTMENT Provider Note   CSN: [id number] Arrival date & time: 08/14/21  2010     History Chief Complaint  Patient presents with   Abdominal Pain    Alicia Owens is a 45 y.o. female.  Patient presents with chief complaint of left lower quadrant pain.  Symptoms ongoing for 1 week.  She was seen in urgent care and started on antibiotics which she has been taking.  She presents to the ER today because the pain had been getting better but acutely worsened this morning.  Describes it as more intense sharp pain in the left lower quadrant rating to the back.  Denies other fevers or cough or vomiting or diarrhea.  She has a history of diverticulitis in the past.      Past Medical History:  Diagnosis Date   Anemia    Anxiety    Chronic abdominal pain    Depression    Gastroparesis    GERD (gastroesophageal reflux disease)    Headache(784.0)    Hypertension    Hyperthyroidism 2011   IBS (irritable bowel syndrome)    Iron deficiency anemia, unspecified 10/05/2019   More than likely this is due to heavy cycles.  Patient has ongoing history of heavy cycles.  IFBOT - January 2021   Menorrhagia 10/05/2019   PONV (postoperative nausea and vomiting)    PP care - C/S 12/17 08/22/2011    Patient Active Problem List   Diagnosis Date Noted   Moderate persistent asthma 08/30/2020   Cough 08/30/2020   Nasal sore 08/30/2020   Iron deficiency anemia, unspecified 10/05/2019   Menorrhagia 10/05/2019   Diverticulitis 04/02/2018   Pedal edema 11/21/2017   Reactive airway disease 09/27/2016   IBS (irritable bowel syndrome) 07/22/2013   Migraines 12/17/2012   Hypertension 10/16/2011   Elevated liver enzymes 10/16/2011   PP care - C/S 12/17 08/22/2011   ANXIETY DEPRESSION 08/20/2009   GASTRITIS, HX OF 05/20/2009   GERD 05/03/2009   CHEST PAIN 05/03/2009    Past Surgical History:  Procedure Laterality Date   APPENDECTOMY     BASAL CELL CARCINOMA EXCISION  02/2006    face   BREAST SURGERY     Breast reduction   CESAREAN SECTION  2007 and 2012   x2   CHOLECYSTECTOMY  12/15/2011   Procedure: LAPAROSCOPIC CHOLECYSTECTOMY;  Surgeon: Dalia Heading, MD;  Location: [facility];  Service: General;  Laterality: N/A;   COLONOSCOPY     ESOPHAGOGASTRODUODENOSCOPY     TUBAL LIGATION     with last c-section   WISDOM TOOTH EXTRACTION  08/2003     OB History     Gravida  2   Para  2   Term  2   Preterm      AB      Living  2      SAB      IAB      Ectopic      Multiple      Live Births  1           Family History  Problem Relation Age of Onset   Healthy Daughter    Healthy Son    Hypertension Mother    Hypertension Father    Hyperlipidemia Father    Hyperlipidemia Other    Hypertension Other    Anesthesia problems Neg Hx    Hypotension Neg Hx    Malignant hyperthermia Neg Hx    Pseudochol deficiency Neg Hx  Social History   Tobacco Use   Smoking status: Never   Smokeless tobacco: Never  Vaping Use   Vaping Use: Never used  Substance Use Topics   Alcohol use: No   Drug use: No    Home Medications Prior to Admission medications   Medication Sig Start Date End Date Taking? Authorizing Provider  potassium chloride SA (KLOR-CON M) 20 MEQ tablet Take 1 tablet (20 mEq total) by mouth 2 (two) times daily for 1 day. 08/14/21 08/15/21 Yes Luna Fuse, MD  SUMAtriptan (IMITREX) 100 MG tablet TAKE ONE TABLET BY MOUTH AT ONSET OF HEADACHE. MAY REPEAT ONE TIME IN TWO HOURS IF NO RELIEF. DO NOT EXCEED TWO TABLETS IN 24 HOURS. 08/01/21   Kathyrn Drown, MD  albuterol (VENTOLIN HFA) 108 (90 Base) MCG/ACT inhaler Inhale 2 puffs into the lungs every 6 (six) hours as needed for wheezing or shortness of breath. 04/12/21   Kathyrn Drown, MD  ALPRAZolam Duanne Moron) 0.25 MG tablet TAKE ONE TABLET BY MOUTH TWICE DAILY AS NEEDED 07/01/21   Kathyrn Drown, MD  amoxicillin-clavulanate (AUGMENTIN) 875-125 MG tablet Take 1 tablet by mouth 3  (three) times daily for 7 days. 08/11/21 08/18/21  Melynda Ripple, MD  busPIRone (BUSPAR) 15 MG tablet TAKE ONE (1) TABLET TWICE  EACH DAY 04/12/21   Kathyrn Drown, MD  citalopram (CELEXA) 20 MG tablet Take 1 tablet (20 mg total) by mouth daily. 04/12/21   Kathyrn Drown, MD  CRANBERRY PO Take by mouth.    [provider]  cyclobenzaprine (FLEXERIL) 10 MG tablet TAKE ONE (1) TABLET BY MOUTH 3 TIMES DAILY AS NEEDED (BETWEEN MEALS AND AT BEDTIME) 02/25/21   Luk[place], Elayne Snare, MD  dicyclomine (BENTYL) 20 MG tablet TAKE ONE TABLET BY MOUTH THREE TIMES DAILY AS NEEDED OR AS DIRECTED 07/01/21   Kathyrn Drown, MD  famotidine (PEPCID) 40 MG tablet TAKE ONE TABLET (40MG  TOTAL) BY MOUTH DAILY 05/31/21   Kathyrn Drown, MD  fexofenadine (ALLEGRA) 180 MG tablet Take 180 mg by mouth daily.    [provider]  fluticasone (FLOVENT HFA) 220 MCG/ACT inhaler INHALE 2 PUFFS INTO THE LUNGS TWICE A DAY 04/12/21   Kathyrn Drown, MD  hydrochlorothiazide (HYDRODIURIL) 25 MG tablet 1 every morning 04/12/21   Luking, Elayne Snare, MD  ibuprofen (ADVIL) 600 MG tablet Take 1 tablet (600 mg total) by mouth every 6 (six) hours as needed. 08/11/21   Melynda Ripple, MD  Lactobacillus-Inulin ([place]) CAPS Take 1 capsule by mouth daily.    [provider]  mometasone (NASONEX) 50 MCG/ACT nasal spray Place 2 sprays into the nose as needed.     [provider]  montelukast (SINGULAIR) 10 MG tablet 1 qd 04/12/21   Kathyrn Drown, MD  Multiple Vitamins-Minerals (MULTIVITAMIN WITH MINERALS) tablet Take 1 tablet by mouth daily.    [provider]  ondansetron (ZOFRAN) 8 MG tablet TAKE ONE TABLET BY MOUTH THREE TO FOUR TIMES A DAY AS NEEDED 07/01/21   Kathyrn Drown, MD  potassium chloride (KLOR-CON) 10 MEQ tablet TAKE ONE TABLET BY MOUTH ONCE A DAY AS NEEDED WITH HCTZ 04/12/21   [place], Elayne Snare, MD  SUPER B COMPLEX/C PO Take 1 tablet by mouth daily.     [provider]   SV CRANBERRY PO Take 1 tablet by mouth daily.     [provider]  topiramate (TOPAMAX) 50 MG tablet TAKE ONE TABLET BY MOUTH IN THE  MORNING AND TWO TALBETS IN THE EVENING 04/12/21   Kathyrn Drown, MD  TURMERIC PO Take 1 capsule by mouth daily.     [provider]  VITAMIN D, CHOLECALCIFEROL, PO Take by mouth 3 (three) times a week.    [provider]    Allergies    Ciprofloxacin, Codeine, Flagyl [metronidazole], Promethazine hcl, Reglan [metoclopramide], and Sulfonamide derivatives  Review of Systems   Review of Systems  Physical Exam Updated Vital Signs BP 132/85 (BP Location: Right Arm)   Pulse 68   Temp (!) 97.5 F (36.4 C) (Oral)   Resp 18   Ht 5\' 1"  (1.549 m)   Wt 71.7 kg   LMP 07/29/2021   SpO2 100%   BMI 29.85 kg/m   Physical Exam  ED Results / Procedures / Treatments   Labs (all labs ordered are listed, but only abnormal results are displayed) Labs Reviewed  COMPREHENSIVE METABOLIC PANEL - Abnormal; Notable for the following components:      Result Value   Potassium 2.6 (*)    Glucose, Bld 117 (*)    Total Bilirubin 0.2 (*)    All other components within normal limits  LIPASE, BLOOD - Abnormal; Notable for the following components:   Lipase 56 (*)    All other components within normal limits  CBC WITH DIFFERENTIAL/PLATELET  URINALYSIS, ROUTINE W REFLEX MICROSCOPIC  PREGNANCY, URINE    EKG None  Radiology CT Abdomen Pelvis W Contrast  Result Date: 08/14/2021 CLINICAL DATA:  45 year old female with abdominal pain. Left lower quadrant pain since last week. History of diverticulitis, currently on antibiotics. EXAM: CT ABDOMEN AND PELVIS WITH CONTRAST TECHNIQUE: Multidetector CT imaging of the abdomen and pelvis was performed using the standard protocol following bolus administration of intravenous contrast. CONTRAST:  1108mL OMNIPAQUE IOHEXOL 300 MG/ML  SOLN COMPARISON:  CT Abdomen and Pelvis 03/19/2018 and earlier. FINDINGS:  Lower chest: Negative. Hepatobiliary: Chronically absent gallbladder.  Negative liver. Pancreas: Negative. Spleen: Negative. Adrenals/Urinary Tract: Negative adrenal glands. The kidneys appear symmetric and normal. Normal renal enhancement and contrast excretion. No nephrolithiasis identified. Negative ureters and bladder. Stomach/Bowel: Negative rectum. Only mild diverticula in the sigmoid colon. But the proximal sigmoid and junction with the descending colon does appear featureless (coronal images 41 and 48). But no mesenteric stranding. Mild retained stool throughout that segment. Similar retained stool in the upstream descending colon. Similar retained stool, negative appearance of the transverse and right colon. Prior appendectomy. Negative terminal ileum. No dilated small bowel. Negative stomach and duodenum. No free air or free fluid. Vascular/Lymphatic: Major arterial structures in the abdomen and pelvis are patent with only trace calcified atherosclerosis. Portal venous system is patent. No lymphadenopathy. Reproductive: Within normal limits. Other: No pelvic free fluid.  Incidental pelvic phleboliths. Musculoskeletal: Negative. IMPRESSION: 1. Mildly featureless appearance of the sigmoid colon and junction with the descending segment; a mild colitis difficult to exclude. This is the same region affected on the 2019 CT but there is no mesenteric inflammation or overt diverticulitis now. And only mild diverticula in the region. 2. No other acute or inflammatory process in the abdomen or pelvis. Electronically Signed   By: Genevie Ann M.D.   On: 08/14/2021 10:27    Procedures Procedures   Medications Ordered in ED Medications  morphine 4 MG/ML injection 4 mg (4 mg Intravenous Not Given 08/14/21 0915)  ondansetron (ZOFRAN) injection 4 mg (4 mg Intravenous Not Given 08/14/21 0915)  potassium chloride 10 mEq in 100 mL IVPB (10  mEq Intravenous New Bag/Given 08/14/21 1002)  potassium chloride SA (KLOR-CON M)  CR tablet 40 mEq (40 mEq Oral Given 08/14/21 1002)  magnesium oxide (MAG-OX) tablet 400 mg (400 mg Oral Given 08/14/21 1026)  iohexol (OMNIPAQUE) 300 MG/ML solution 100 mL (100 mLs Intravenous Contrast Given 08/14/21 1011)    ED Course  I have reviewed the triage vital signs and the nursing notes.  Pertinent labs & imaging results that were available during my care of the patient were reviewed by me and considered in my medical decision making (see chart for details).    MDM Rules/Calculators/A&P                           Labs are sent is unremarkable echo normal chemistry normal.  Patient declined pain medication here in the ER.  Abdominal exam is benign with just mild tenderness in left lower quadrant no guarding or rebound.  CT abdomen pelvis shows mild colitis.  Patient already on antibiotics.  Advised her to finish course of antibiotics, advised follow-up with her doctor within the week.  Advised immediate return for fevers or worsening pain or any additional concerns.  Final Clinical Impression(s) / ED Diagnoses Final diagnoses:  Abdominal pain, unspecified abdominal location    Rx / DC Orders ED Discharge Orders          Ordered    potassium chloride SA (KLOR-CON M) 20 MEQ tablet  2 times daily        08/14/21 1039             [place], Eustace Moore, MD 08/14/21 1040

## 2021-08-14 NOTE — ED Notes (Signed)
Pt in gown and put on 5 lead monitor

## 2021-08-14 NOTE — ED Notes (Signed)
Pt off the floor for CT

## 2021-08-17 ENCOUNTER — Telehealth: Payer: Self-pay | Admitting: Family Medicine

## 2021-08-17 DIAGNOSIS — K529 Noninfective gastroenteritis and colitis, unspecified: Secondary | ICD-10-CM

## 2021-08-17 DIAGNOSIS — R109 Unspecified abdominal pain: Secondary | ICD-10-CM

## 2021-08-17 DIAGNOSIS — E876 Hypokalemia: Secondary | ICD-10-CM

## 2021-08-17 NOTE — Telephone Encounter (Signed)
Patient was in the hospital ER 3 days ago.  Diagnosed with colitis.  She was instructed to follow-up in 1 week.  I would like to see her for recheck next Tuesday or Wednesday.  Please have her do CBC metabolic 7, liver, lipase on Monday so we will have results by the time she comes in to be seen  Diagnosis abdominal pain, colitis, hypokalemia  Issues she is having any type of severe setbacks currently please let me know

## 2021-08-17 NOTE — Telephone Encounter (Signed)
Lab orders placed. Attempted to contact patient but voicemail is full. Sent my chart message

## 2021-08-22 DIAGNOSIS — E876 Hypokalemia: Secondary | ICD-10-CM | POA: Diagnosis not present

## 2021-08-22 DIAGNOSIS — R109 Unspecified abdominal pain: Secondary | ICD-10-CM | POA: Diagnosis not present

## 2021-08-22 DIAGNOSIS — K529 Noninfective gastroenteritis and colitis, unspecified: Secondary | ICD-10-CM | POA: Diagnosis not present

## 2021-08-23 LAB — CBC WITH DIFFERENTIAL/PLATELET
Basophils Absolute: 0.1 10*3/uL (ref 0.0–0.2)
Basos: 1 %
EOS (ABSOLUTE): 0.5 10*3/uL — ABNORMAL HIGH (ref 0.0–0.4)
Eos: 6 %
Hematocrit: 39.6 % (ref 34.0–46.6)
Hemoglobin: 13.6 g/dL (ref 11.1–15.9)
Immature Grans (Abs): 0 10*3/uL (ref 0.0–0.1)
Immature Granulocytes: 0 %
Lymphocytes Absolute: 3.2 10*3/uL — ABNORMAL HIGH (ref 0.7–3.1)
Lymphs: 36 %
MCH: 30.8 pg (ref 26.6–33.0)
MCHC: 34.3 g/dL (ref 31.5–35.7)
MCV: 90 fL (ref 79–97)
Monocytes Absolute: 0.6 10*3/uL (ref 0.1–0.9)
Monocytes: 7 %
Neutrophils Absolute: 4.4 10*3/uL (ref 1.4–7.0)
Neutrophils: 50 %
Platelets: 367 10*3/uL (ref 150–450)
RBC: 4.42 x10E6/uL (ref 3.77–5.28)
RDW: 11.7 % (ref 11.7–15.4)
WBC: 8.7 10*3/uL (ref 3.4–10.8)

## 2021-08-23 LAB — BASIC METABOLIC PANEL
BUN/Creatinine Ratio: 8 — ABNORMAL LOW (ref 9–23)
BUN: 8 mg/dL (ref 6–24)
CO2: 22 mmol/L (ref 20–29)
Calcium: 9.3 mg/dL (ref 8.7–10.2)
Chloride: 98 mmol/L (ref 96–106)
Creatinine, Ser: 1.02 mg/dL — ABNORMAL HIGH (ref 0.57–1.00)
Glucose: 131 mg/dL — ABNORMAL HIGH (ref 70–99)
Potassium: 3.6 mmol/L (ref 3.5–5.2)
Sodium: 138 mmol/L (ref 134–144)
eGFR: 69 mL/min/{1.73_m2} (ref >59)

## 2021-08-23 LAB — HEPATIC FUNCTION PANEL
ALT: 23 IU/L (ref 0–32)
AST: 16 IU/L (ref 0–40)
Albumin: 4.3 g/dL (ref 3.8–4.8)
Alkaline Phosphatase: 54 IU/L (ref 44–121)
Bilirubin Total: 0.2 mg/dL (ref 0.0–1.2)
Bilirubin, Direct: <0.1 mg/dL (ref 0.00–0.40)
Total Protein: 7 g/dL (ref 6.0–8.5)

## 2021-08-23 LAB — LIPASE: Lipase: 46 U/L (ref 14–72)

## 2021-08-23 NOTE — Telephone Encounter (Signed)
Patient completed lab work 08/22/21 and has follow up office visit scheduled 08/24/21 with Dr Lorin Picket.

## 2021-08-24 ENCOUNTER — Ambulatory Visit: Payer: BC Managed Care – PPO | Admitting: Family Medicine

## 2021-09-29 DIAGNOSIS — N912 Amenorrhea, unspecified: Secondary | ICD-10-CM | POA: Diagnosis not present

## 2021-09-29 DIAGNOSIS — R102 Pelvic and perineal pain: Secondary | ICD-10-CM | POA: Diagnosis not present

## 2021-10-04 DIAGNOSIS — R102 Pelvic and perineal pain: Secondary | ICD-10-CM | POA: Diagnosis not present

## 2021-10-06 ENCOUNTER — Other Ambulatory Visit: Payer: Self-pay

## 2021-10-06 ENCOUNTER — Encounter: Payer: Self-pay | Admitting: Family Medicine

## 2021-10-06 ENCOUNTER — Ambulatory Visit: Payer: BC Managed Care – PPO | Admitting: Family Medicine

## 2021-10-06 VITALS — BP 133/89 | Temp 97.9°F | Wt 170.6 lb

## 2021-10-06 DIAGNOSIS — R600 Localized edema: Secondary | ICD-10-CM

## 2021-10-06 DIAGNOSIS — M542 Cervicalgia: Secondary | ICD-10-CM | POA: Diagnosis not present

## 2021-10-06 DIAGNOSIS — R200 Anesthesia of skin: Secondary | ICD-10-CM | POA: Diagnosis not present

## 2021-10-06 MED ORDER — FUROSEMIDE 20 MG PO TABS: ORAL_TABLET | ORAL | 0 refills | Status: DC

## 2021-10-06 MED ORDER — POTASSIUM CHLORIDE CRYS ER 10 MEQ PO TBCR: EXTENDED_RELEASE_TABLET | ORAL | 6 refills | Status: DC

## 2021-10-06 NOTE — Patient Instructions (Signed)
Send update within a 10 to 14 day window

## 2021-10-06 NOTE — Progress Notes (Signed)
° °  Subjective:    Patient ID: Alicia Owens, female    DOB: Mar 08, 1976, 46 y.o.   MRN: [id number]  HPI Pt having body pain. Pt states she had an "ache sore feeling". Went to legs and arms that lasted 2 weeks. Started on New Years Eve. She is now having pain in lower back and neck area. Pt having some fluid retention in legs that is not normal for her.   Started new years eve Arms and legs/thigh aching- bad discomfort Significant fatigue No uti sx Ache in lower back- very painful Not like sciatica Right side of neck discomfort No headache No fever No n or v Felt winded at time  Review of Systems     Objective:   Physical Exam Upper extremities reflexes good lower extremity reflexes good No clonus Strength in legs and arms good No interosseous weakness, no weakness in the legs, subjective low back pain and discomfort with extension and rotation Negative straight leg raise Lungs clear heart regular Right side of neck mild discomfort       Assessment & Plan:  1. Bilateral hand numbness It is hard to discern if this is carpal tunnel or if this is a possibility of a cervical impingement.  Given that she had bilateral arm numbness along with legs aching and having discomfort for approximately 2 weeks cervical impingement could be going on.  I find no evidence of myelopathy currently.  Patient will give [place] update within 10 to 12 days how things go and if still having the symptoms recommend MRI of the cervical spine  2. Pedal edema She is having some pedal edema but its not severe I told her if it does get severe she can use furosemide in addition to her hydrochlorothiazide but take double the potassium hopefully only utilizes this sparingly not meant for frequent basis  3. Neck pain on right side More than likely musculoskeletal but see discussion above regarding numbness in the arms and the discomfort in the legs may need cervical MRI in the future  If patient has persistent  numbness weakness or any other symptoms into the arms and MRI of the neck is normal referral to neurology  Patient with lower back pain discomfort denies radiation currently but she has had problems with sciatica she states that wearing dress shoes with heels cause significant back pain where is wearing athletic shoes such as tennis shoes she does not have that problem-we will do a letter for her

## 2021-10-16 ENCOUNTER — Encounter: Payer: Self-pay | Admitting: Family Medicine

## 2021-10-16 DIAGNOSIS — Z79899 Other long term (current) drug therapy: Secondary | ICD-10-CM

## 2021-10-16 DIAGNOSIS — R7303 Prediabetes: Secondary | ICD-10-CM

## 2021-10-16 DIAGNOSIS — E785 Hyperlipidemia, unspecified: Secondary | ICD-10-CM

## 2021-10-17 NOTE — Telephone Encounter (Signed)
Nurses Patient would be due for her A1c, lipid, met 7 Diagnosis hypertension, hyperlipidemia, prediabetes  I would recommend going ahead with the blood work this month with a follow-up office visit no later than the first week of March-have the front to schedule her the last week of February or first week of March.  As for the neck the neck step would be is an MRI of the neck.  If she would like to pursue this now we can order MRI of the neck because of cervical impingement with severe pain into the arms and numbness.  If she would like to wait till follow-up in 2 weeks - 3 weeks we can discuss it further at that time and set it up.  Thanks-Dr. Nicki Reaper

## 2021-10-21 MED ORDER — GABAPENTIN 100 MG PO CAPS: 100.0000 mg | ORAL_CAPSULE | Freq: Two times a day (BID) | ORAL | 2 refills | Status: DC

## 2021-10-21 NOTE — Telephone Encounter (Signed)
Nurses Gabapentin is a nerve modulator It can reduce the amount of discomfort and pain Most people see benefit from Some people though side effects can cause them to not tolerate the medicine Potential side effects include swelling in the ankles, drowsiness, dizziness  Typically if we start at a low dose it is well-tolerated Starting dose gabapentin 100 mg 1 twice daily for the next several weeks #60 with 2 refills Follow-up office visit for further discussion is recommended I believe patient does have a follow-up office visit we can discuss further at that time as well as MRI

## 2021-10-21 NOTE — Addendum Note (Signed)
Addended by: Margaretha Sheffield on: 10/21/2021 04:18 PM   Modules accepted: Orders

## 2021-10-24 ENCOUNTER — Ambulatory Visit: Payer: BC Managed Care – PPO | Admitting: Family Medicine

## 2021-10-26 ENCOUNTER — Other Ambulatory Visit: Payer: Self-pay

## 2021-10-26 ENCOUNTER — Telehealth: Payer: Self-pay | Admitting: Family Medicine

## 2021-10-26 MED ORDER — GABAPENTIN 100 MG PO CAPS: 100.0000 mg | ORAL_CAPSULE | Freq: Two times a day (BID) | ORAL | 2 refills | Status: DC

## 2021-10-26 NOTE — Telephone Encounter (Signed)
Melissa from [facility] calling to verify that we sent in Gabapentin on 10/21/21. Pharmacy system went down Friday and did not come back up until Saturday. Gabapentin resent to pharmacy

## 2021-11-03 DIAGNOSIS — E785 Hyperlipidemia, unspecified: Secondary | ICD-10-CM | POA: Diagnosis not present

## 2021-11-03 DIAGNOSIS — R7303 Prediabetes: Secondary | ICD-10-CM | POA: Diagnosis not present

## 2021-11-03 DIAGNOSIS — Z79899 Other long term (current) drug therapy: Secondary | ICD-10-CM | POA: Diagnosis not present

## 2021-11-04 ENCOUNTER — Other Ambulatory Visit: Payer: Self-pay | Admitting: Family Medicine

## 2021-11-04 LAB — LIPID PANEL
Chol/HDL Ratio: 4.1 ratio (ref 0.0–4.4)
Cholesterol, Total: 225 mg/dL — ABNORMAL HIGH (ref 100–199)
HDL: 55 mg/dL (ref >39)
LDL Chol Calc (NIH): 153 mg/dL — ABNORMAL HIGH (ref 0–99)
Triglycerides: 94 mg/dL (ref 0–149)
VLDL Cholesterol Cal: 17 mg/dL (ref 5–40)

## 2021-11-04 LAB — BASIC METABOLIC PANEL
BUN/Creatinine Ratio: 13 (ref 9–23)
BUN: 11 mg/dL (ref 6–24)
CO2: 25 mmol/L (ref 20–29)
Calcium: 9.4 mg/dL (ref 8.7–10.2)
Chloride: 100 mmol/L (ref 96–106)
Creatinine, Ser: 0.84 mg/dL (ref 0.57–1.00)
Glucose: 123 mg/dL — ABNORMAL HIGH (ref 70–99)
Potassium: 4.2 mmol/L (ref 3.5–5.2)
Sodium: 140 mmol/L (ref 134–144)
eGFR: 87 mL/min/{1.73_m2} (ref >59)

## 2021-11-04 LAB — HEMOGLOBIN A1C
Est. average glucose Bld gHb Est-mCnc: 131 mg/dL
Hgb A1c MFr Bld: 6.2 % — ABNORMAL HIGH (ref 4.8–5.6)

## 2021-11-08 ENCOUNTER — Other Ambulatory Visit: Payer: Self-pay

## 2021-11-08 ENCOUNTER — Ambulatory Visit: Payer: BC Managed Care – PPO | Admitting: Family Medicine

## 2021-11-08 VITALS — BP 110/70 | HR 81 | Temp 98.5°F | Ht 61.0 in | Wt 167.8 lb

## 2021-11-08 DIAGNOSIS — M5442 Lumbago with sciatica, left side: Secondary | ICD-10-CM

## 2021-11-08 DIAGNOSIS — M5432 Sciatica, left side: Secondary | ICD-10-CM | POA: Diagnosis not present

## 2021-11-08 DIAGNOSIS — R7303 Prediabetes: Secondary | ICD-10-CM

## 2021-11-08 DIAGNOSIS — M542 Cervicalgia: Secondary | ICD-10-CM

## 2021-11-08 MED ORDER — FAMOTIDINE 40 MG PO TABS: ORAL_TABLET | ORAL | 5 refills | Status: DC

## 2021-11-08 NOTE — Progress Notes (Signed)
? ?Subjective:  ? ? Patient ID: Alicia Owens, female    DOB: [birth year], 46 y.o.   MRN: [id number] ? ?Back Pain ?This is a chronic problem. The current episode started more than 1 month ago. The problem occurs constantly. The problem has been gradually worsening since onset. The pain is present in the gluteal and lumbar spine. The quality of the pain is described as cramping, burning and shooting. The pain radiates to the left foot (left calf pain). The pain is at a severity of 8/10. The pain is severe. Worse during: worse late evening. The symptoms are aggravated by bending, position, twisting and standing (walking and lifting). Associated symptoms include numbness, paresis and tingling. Pertinent negatives include no bladder incontinence, bowel incontinence, chest pain, perianal numbness or weakness. She has tried analgesics and ice for the symptoms. The treatment provided mild relief.  ?Neck Pain  ?This is a chronic problem. The current episode started more than 1 month ago. The problem occurs constantly (at least 4 days a week). The problem has been gradually worsening. The pain is associated with nothing. The pain is present in the right side. The quality of the pain is described as aching and stabbing. The pain is at a severity of 4/10. The pain is moderate. Nothing aggravates the symptoms. Associated symptoms include numbness, paresis and tingling. Pertinent negatives include no chest pain or weakness. She has tried acetaminophen for the symptoms. The treatment provided moderate relief.  ?Hyperlipidemia ?This is a chronic problem. The current episode started more than 1 year ago. The problem is uncontrolled. Recent lipid tests were reviewed and are high. She has no history of diabetes. There are no known factors aggravating her hyperlipidemia. Pertinent negatives include no chest pain. Current antihyperlipidemic treatment includes diet change and exercise. There are no compliance problems.    ? ?Prediabetes ? ?Neck pain on right side - Plan: DG Cervical Spine Complete ? ?Sciatica, left side ? ?Acute midline low back pain with left-sided sciatica - Plan: DG Cervical Spine Complete, DG Lumbar Spine Complete ? ? ?Review of Systems  ?Cardiovascular:  Negative for chest pain.  ?Gastrointestinal:  Negative for bowel incontinence.  ?Genitourinary:  Negative for bladder incontinence.  ?Musculoskeletal:  Positive for back pain and neck pain.  ?Neurological:  Positive for tingling and numbness. Negative for weakness.  ? ?   ?Objective:  ? Physical Exam ?General-in no acute distress ?Eyes-no discharge ?Lungs-respiratory rate normal, CTA ?CV-no murmurs,RRR ?Extremities skin warm dry no edema ?Neuro grossly normal ?Behavior normal, alert ?Positive straight leg raise on the left subjective discomfort along the left lateral leg bottom of the foot strength is overall good ?The 10-year ASCVD risk score (Arnett DK, et al., 2019) is: 1% ?  Values used to calculate the score: ?    Age: 35 years ?    Sex: Female ?    Is Non-Hispanic African American: No ?    Diabetic: No ?    Tobacco smoker: No ?    Systolic Blood Pressure: 110 mmHg ?    Is BP treated: Yes ?    HDL Cholesterol: 55 mg/dL ?    Total Cholesterol: 225 mg/dL ? ? ?Labs reviewed with patient in detail ? ?   ?Assessment & Plan:  ?1. Prediabetes ?Very important patient to work hard on diet and exercise ? ?2. Neck pain on right side ?Cervical spine x-rays recommended.  May ?Further work-up if it worsens or starts radiating down the arm Tylenol currently ? ?3. Sciatica, left  side ?Increase gabapentin twice daily side effects discussed shared discussion was made her symptoms of been going on for 3 months now we saw her back in February recommended conservative measures these have not helped we will do x-rays and then more than likely follow this up with an MRI ? ?4. Acute midline low back pain with left-sided sciatica ?Lumbar x-rays ordered ?Please see above  discussion ? ?- DG Cervical Spine Complete ?- DG Lumbar Spine Complete ? ?Hyperlipidemia risk ratio not at the point of utilizing statins but very important for patient do the best candidate getting this under control as she gets older may well have to be on statins or if LDL goes up ?

## 2021-11-11 ENCOUNTER — Telehealth: Payer: Self-pay | Admitting: *Deleted

## 2021-11-11 ENCOUNTER — Other Ambulatory Visit: Payer: Self-pay | Admitting: Family Medicine

## 2021-11-11 MED ORDER — MUPIROCIN CALCIUM 2 % NA OINT: 1.0000 "application " | TOPICAL_OINTMENT | Freq: Two times a day (BID) | NASAL | 0 refills | Status: DC

## 2021-11-16 ENCOUNTER — Encounter: Payer: Self-pay | Admitting: Family Medicine

## 2021-11-16 MED ORDER — DOXYCYCLINE HYCLATE 100 MG PO TABS: ORAL_TABLET | ORAL | 0 refills | Status: DC

## 2021-11-16 NOTE — Telephone Encounter (Signed)
Nurses ?I reviewed over of the note from [place] ?Given her symptoms I would recommend doxycycline 100 mg 1 taken twice daily for 7 days.  Take with a snack and a tall glass of water.  This should help take care of the nasal issue as well as sinus.  If ongoing troubles we will be happy to do a recheck office visit thanks ?

## 2021-11-30 DIAGNOSIS — Z1231 Encounter for screening mammogram for malignant neoplasm of breast: Secondary | ICD-10-CM | POA: Diagnosis not present

## 2021-11-30 DIAGNOSIS — N951 Menopausal and female climacteric states: Secondary | ICD-10-CM | POA: Diagnosis not present

## 2021-11-30 DIAGNOSIS — Z9229 Personal history of other drug therapy: Secondary | ICD-10-CM | POA: Diagnosis not present

## 2021-11-30 DIAGNOSIS — E669 Obesity, unspecified: Secondary | ICD-10-CM | POA: Diagnosis not present

## 2021-11-30 DIAGNOSIS — Z01419 Encounter for gynecological examination (general) (routine) without abnormal findings: Secondary | ICD-10-CM | POA: Diagnosis not present

## 2021-12-07 ENCOUNTER — Ambulatory Visit: Payer: BC Managed Care – PPO | Admitting: Family Medicine

## 2022-01-06 DIAGNOSIS — N951 Menopausal and female climacteric states: Secondary | ICD-10-CM | POA: Diagnosis not present

## 2022-01-06 DIAGNOSIS — Z719 Counseling, unspecified: Secondary | ICD-10-CM | POA: Diagnosis not present

## 2022-01-06 DIAGNOSIS — Z9229 Personal history of other drug therapy: Secondary | ICD-10-CM | POA: Diagnosis not present

## 2022-01-11 DIAGNOSIS — N924 Excessive bleeding in the premenopausal period: Secondary | ICD-10-CM | POA: Diagnosis not present

## 2022-02-08 DIAGNOSIS — G43909 Migraine, unspecified, not intractable, without status migrainosus: Secondary | ICD-10-CM | POA: Diagnosis not present

## 2022-02-08 DIAGNOSIS — N938 Other specified abnormal uterine and vaginal bleeding: Secondary | ICD-10-CM | POA: Diagnosis not present

## 2022-02-08 DIAGNOSIS — R102 Pelvic and perineal pain: Secondary | ICD-10-CM | POA: Diagnosis not present

## 2022-02-08 DIAGNOSIS — Z719 Counseling, unspecified: Secondary | ICD-10-CM | POA: Diagnosis not present

## 2022-02-18 ENCOUNTER — Other Ambulatory Visit: Payer: Self-pay | Admitting: Family Medicine

## 2022-02-19 ENCOUNTER — Encounter: Payer: Self-pay | Admitting: Family Medicine

## 2022-03-17 DIAGNOSIS — N938 Other specified abnormal uterine and vaginal bleeding: Secondary | ICD-10-CM | POA: Diagnosis not present

## 2022-03-17 DIAGNOSIS — R102 Pelvic and perineal pain: Secondary | ICD-10-CM | POA: Diagnosis not present

## 2022-03-17 DIAGNOSIS — N939 Abnormal uterine and vaginal bleeding, unspecified: Secondary | ICD-10-CM | POA: Diagnosis not present

## 2022-03-17 DIAGNOSIS — Z01818 Encounter for other preprocedural examination: Secondary | ICD-10-CM | POA: Diagnosis not present

## 2022-03-17 DIAGNOSIS — E669 Obesity, unspecified: Secondary | ICD-10-CM | POA: Diagnosis not present

## 2022-04-04 DIAGNOSIS — Z882 Allergy status to sulfonamides status: Secondary | ICD-10-CM | POA: Diagnosis not present

## 2022-04-04 DIAGNOSIS — J45909 Unspecified asthma, uncomplicated: Secondary | ICD-10-CM | POA: Diagnosis not present

## 2022-04-04 DIAGNOSIS — Z79899 Other long term (current) drug therapy: Secondary | ICD-10-CM | POA: Diagnosis not present

## 2022-04-04 DIAGNOSIS — N838 Other noninflammatory disorders of ovary, fallopian tube and broad ligament: Secondary | ICD-10-CM | POA: Diagnosis not present

## 2022-04-04 DIAGNOSIS — E669 Obesity, unspecified: Secondary | ICD-10-CM | POA: Diagnosis not present

## 2022-04-04 DIAGNOSIS — K219 Gastro-esophageal reflux disease without esophagitis: Secondary | ICD-10-CM | POA: Diagnosis not present

## 2022-04-04 DIAGNOSIS — F419 Anxiety disorder, unspecified: Secondary | ICD-10-CM | POA: Diagnosis not present

## 2022-04-04 DIAGNOSIS — Z888 Allergy status to other drugs, medicaments and biological substances status: Secondary | ICD-10-CM | POA: Diagnosis not present

## 2022-04-04 DIAGNOSIS — N8003 Adenomyosis of the uterus: Secondary | ICD-10-CM | POA: Diagnosis not present

## 2022-04-04 DIAGNOSIS — Z885 Allergy status to narcotic agent status: Secondary | ICD-10-CM | POA: Diagnosis not present

## 2022-04-04 DIAGNOSIS — R102 Pelvic and perineal pain: Secondary | ICD-10-CM | POA: Diagnosis not present

## 2022-04-04 DIAGNOSIS — F32A Depression, unspecified: Secondary | ICD-10-CM | POA: Diagnosis not present

## 2022-04-04 DIAGNOSIS — Z6832 Body mass index (BMI) 32.0-32.9, adult: Secondary | ICD-10-CM | POA: Diagnosis not present

## 2022-04-04 DIAGNOSIS — N921 Excessive and frequent menstruation with irregular cycle: Secondary | ICD-10-CM | POA: Diagnosis not present

## 2022-04-06 NOTE — Telephone Encounter (Signed)
error 

## 2022-04-10 DIAGNOSIS — K219 Gastro-esophageal reflux disease without esophagitis: Secondary | ICD-10-CM | POA: Diagnosis not present

## 2022-04-10 DIAGNOSIS — N921 Excessive and frequent menstruation with irregular cycle: Secondary | ICD-10-CM | POA: Diagnosis not present

## 2022-04-10 DIAGNOSIS — Z888 Allergy status to other drugs, medicaments and biological substances status: Secondary | ICD-10-CM | POA: Diagnosis not present

## 2022-04-10 DIAGNOSIS — Z6832 Body mass index (BMI) 32.0-32.9, adult: Secondary | ICD-10-CM | POA: Diagnosis not present

## 2022-04-10 DIAGNOSIS — Z79899 Other long term (current) drug therapy: Secondary | ICD-10-CM | POA: Diagnosis not present

## 2022-04-10 DIAGNOSIS — F32A Depression, unspecified: Secondary | ICD-10-CM | POA: Diagnosis not present

## 2022-04-10 DIAGNOSIS — N8003 Adenomyosis of the uterus: Secondary | ICD-10-CM | POA: Diagnosis not present

## 2022-04-10 DIAGNOSIS — F419 Anxiety disorder, unspecified: Secondary | ICD-10-CM | POA: Diagnosis not present

## 2022-04-10 DIAGNOSIS — N939 Abnormal uterine and vaginal bleeding, unspecified: Secondary | ICD-10-CM | POA: Diagnosis not present

## 2022-04-10 DIAGNOSIS — E669 Obesity, unspecified: Secondary | ICD-10-CM | POA: Diagnosis not present

## 2022-04-10 DIAGNOSIS — Z882 Allergy status to sulfonamides status: Secondary | ICD-10-CM | POA: Diagnosis not present

## 2022-04-10 DIAGNOSIS — N838 Other noninflammatory disorders of ovary, fallopian tube and broad ligament: Secondary | ICD-10-CM | POA: Diagnosis not present

## 2022-04-10 DIAGNOSIS — Z885 Allergy status to narcotic agent status: Secondary | ICD-10-CM | POA: Diagnosis not present

## 2022-04-10 DIAGNOSIS — R102 Pelvic and perineal pain: Secondary | ICD-10-CM | POA: Diagnosis not present

## 2022-04-10 DIAGNOSIS — J45909 Unspecified asthma, uncomplicated: Secondary | ICD-10-CM | POA: Diagnosis not present

## 2022-04-24 ENCOUNTER — Other Ambulatory Visit: Payer: Self-pay | Admitting: Family Medicine

## 2022-05-06 ENCOUNTER — Other Ambulatory Visit: Payer: Self-pay | Admitting: Family Medicine

## 2022-05-17 ENCOUNTER — Other Ambulatory Visit: Payer: Self-pay | Admitting: Family Medicine

## 2022-05-17 DIAGNOSIS — N898 Other specified noninflammatory disorders of vagina: Secondary | ICD-10-CM | POA: Diagnosis not present

## 2022-05-17 DIAGNOSIS — R300 Dysuria: Secondary | ICD-10-CM | POA: Diagnosis not present

## 2022-05-17 NOTE — Telephone Encounter (Signed)
6 months on all 

## 2022-06-15 ENCOUNTER — Other Ambulatory Visit: Payer: Self-pay | Admitting: Family Medicine

## 2022-07-17 ENCOUNTER — Other Ambulatory Visit: Payer: Self-pay | Admitting: Family Medicine

## 2022-08-15 ENCOUNTER — Other Ambulatory Visit: Payer: Self-pay | Admitting: Family Medicine

## 2022-08-23 ENCOUNTER — Other Ambulatory Visit: Payer: Self-pay | Admitting: Family Medicine

## 2022-08-24 ENCOUNTER — Encounter: Payer: Self-pay | Admitting: Family Medicine

## 2022-08-24 ENCOUNTER — Ambulatory Visit: Payer: BC Managed Care – PPO | Admitting: Family Medicine

## 2022-08-24 VITALS — BP 110/78 | HR 80 | Temp 97.9°F | Ht 61.0 in | Wt 169.0 lb

## 2022-08-24 DIAGNOSIS — R300 Dysuria: Secondary | ICD-10-CM

## 2022-08-24 DIAGNOSIS — D509 Iron deficiency anemia, unspecified: Secondary | ICD-10-CM

## 2022-08-24 DIAGNOSIS — M542 Cervicalgia: Secondary | ICD-10-CM

## 2022-08-24 DIAGNOSIS — E785 Hyperlipidemia, unspecified: Secondary | ICD-10-CM | POA: Diagnosis not present

## 2022-08-24 DIAGNOSIS — J4530 Mild persistent asthma, uncomplicated: Secondary | ICD-10-CM

## 2022-08-24 DIAGNOSIS — Z23 Encounter for immunization: Secondary | ICD-10-CM

## 2022-08-24 DIAGNOSIS — R7303 Prediabetes: Secondary | ICD-10-CM

## 2022-08-24 DIAGNOSIS — K2100 Gastro-esophageal reflux disease with esophagitis, without bleeding: Secondary | ICD-10-CM

## 2022-08-24 DIAGNOSIS — I10 Essential (primary) hypertension: Secondary | ICD-10-CM

## 2022-08-24 DIAGNOSIS — R1319 Other dysphagia: Secondary | ICD-10-CM

## 2022-08-24 LAB — POCT URINALYSIS DIP (CLINITEK)
Bilirubin, UA: NEGATIVE
Blood, UA: NEGATIVE
Glucose, UA: NEGATIVE mg/dL
Ketones, POC UA: NEGATIVE mg/dL
Leukocytes, UA: NEGATIVE
Nitrite, UA: NEGATIVE
POC PROTEIN,UA: NEGATIVE
Spec Grav, UA: >=1.03 — AB (ref 1.010–1.025)
Urobilinogen, UA: 0.2 E.U./dL
pH, UA: 6 (ref 5.0–8.0)

## 2022-08-24 MED ORDER — CITALOPRAM HYDROBROMIDE 10 MG PO TABS: ORAL_TABLET | ORAL | 1 refills | Status: DC

## 2022-08-24 MED ORDER — ALPRAZOLAM 0.25 MG PO TABS: ORAL_TABLET | ORAL | 0 refills | Status: DC

## 2022-08-24 MED ORDER — PANTOPRAZOLE SODIUM 40 MG PO TBEC: 40.0000 mg | DELAYED_RELEASE_TABLET | Freq: Every day | ORAL | 1 refills | Status: DC

## 2022-08-24 MED ORDER — GABAPENTIN 100 MG PO CAPS: ORAL_CAPSULE | ORAL | 5 refills | Status: DC

## 2022-08-24 MED ORDER — POTASSIUM CHLORIDE ER 10 MEQ PO TBCR: EXTENDED_RELEASE_TABLET | ORAL | 1 refills | Status: DC

## 2022-08-24 MED ORDER — TOPIRAMATE 50 MG PO TABS: ORAL_TABLET | ORAL | 1 refills | Status: DC

## 2022-08-24 MED ORDER — MONTELUKAST SODIUM 10 MG PO TABS: ORAL_TABLET | ORAL | 1 refills | Status: DC

## 2022-08-24 NOTE — Progress Notes (Signed)
   Subjective:    Patient ID: Alicia Owens, female    DOB: 11-21-1975, 46 y.o.   MRN: [id number]  HPI 6 month follow up  Dysuria , low abd pain - frequent uti Patient denies high fever chills sweats Hysterectomy done in August 2023 - faxing request fatigue  Chest discomfort Intermittent burning discomfort mid sternum along with intermittent dysphagia and some reflux symptoms discomfort comes and goes last few minutes at a time no particular rhyme or reason to it sometimes last 15 minutes sometimes 30 to 45 minutes there is no association with activity Some radiation into shoulder blades Gaviscon    Neck issue- right side Review of Systems     Objective:   Physical Exam General-in no acute distress Eyes-no discharge Lungs-respiratory rate normal, CTA CV-no murmurs,RRR Extremities skin warm dry no edema Neuro grossly normal Behavior normal, alert        Assessment & Plan:  1. Dysuria Patient with dysuria symptoms.  More than likely this is not a UTI given what I am seeing under the microscope but nonetheless we will do a culture 5-day prescription for antibiotics given in case culture is positive or symptoms get worse - POCT URINALYSIS DIP (CLINITEK) - Urine Culture  2. Hyperlipidemia, unspecified hyperlipidemia type Hyperlipidemia healthy diet regular activity check lipid profile - Lipid Panel  3. Neck pain on right side More than likely musculoskeletal tender along the sternocleidomastoid no thyroid masses felt no lymphadenopathy noted lab work ordered - CBC with Differential - TSH + free T4 - Basic Metabolic Panel - Lipid Panel - Hepatic Function Panel  4. Prediabetes Prediabetes metabolic 7 see how glucose levels doing healthy diet recommended - CBC with Differential - TSH + free T4 - Basic Metabolic Panel - Lipid Panel - Hepatic Function Panel  5. Mild persistent reactive airway disease without complication Overall doing well continue current  medications  6. Gastroesophageal reflux disease with esophagitis without hemorrhage Having intermittent symptoms more than likely famotidine not doing enough.  We did discuss healthy diet to avoid reflux also putting bed on a slight tilt and in addition to this pantoprazole if not seeing significant improvement over the next 2 weeks patient may well need additional testing I doubt coronary artery disease or lung issues Patient states that she has colonoscopy coming up in January or February more than likely will need a EGD at that point in order to rule out the possibility of strictures - CBC with Differential - TSH + free T4 - Basic Metabolic Panel - Lipid Panel - Hepatic Function Panel  7. Esophageal dysphagia She relates large pills and certain foods get hung up halfway down her esophagus patient has upcoming GI will need EGD - CBC with Differential - TSH + free T4 - Basic Metabolic Panel - Lipid Panel - Hepatic Function Panel  8. Primary hypertension Blood pressure good control - CBC with Differential - TSH + free T4 - Basic Metabolic Panel - Lipid Panel - Hepatic Function Panel  9. Iron deficiency anemia, unspecified iron deficiency anemia type Relates a lot of fatigue tiredness we will check CBC - CBC with Differential  10. Immunization due Flu vaccine today - Flu Vaccine QUAD 60mo+IM (Fluarix, Fluzone & Alfiuria Quad PF)

## 2022-08-27 ENCOUNTER — Encounter: Payer: Self-pay | Admitting: Family Medicine

## 2022-08-29 LAB — URINE CULTURE

## 2022-08-29 NOTE — Telephone Encounter (Signed)
Spoke with patient she stated she is feeling better and will not be able to get off work to come in until next Thursday 09/07/21 at 11:30 has been scheduled .

## 2022-08-29 NOTE — Telephone Encounter (Signed)
The preliminary urine culture shows Klebsiella which could well be causing abdominal pain along with UTI.  I would recommend the antibiotics first-hopefully this discomfort will gradually improve  I am not opposed to the CAT scan but I do not necessarily feel like it is absolutely necessary.  CAT scan does involve a fair amount of radiation so we can try to avoid doing a CAT scan if possible to lessen side effects of radiation  Nurses-I would recommend scheduling [facility] a follow-up office visit within 48 hours to recheck, either Wednesday afternoon or Thursday at 1130

## 2022-08-30 DIAGNOSIS — M542 Cervicalgia: Secondary | ICD-10-CM | POA: Diagnosis not present

## 2022-08-30 DIAGNOSIS — K2100 Gastro-esophageal reflux disease with esophagitis, without bleeding: Secondary | ICD-10-CM | POA: Diagnosis not present

## 2022-08-30 DIAGNOSIS — E785 Hyperlipidemia, unspecified: Secondary | ICD-10-CM | POA: Diagnosis not present

## 2022-08-30 DIAGNOSIS — R7303 Prediabetes: Secondary | ICD-10-CM | POA: Diagnosis not present

## 2022-08-30 DIAGNOSIS — I10 Essential (primary) hypertension: Secondary | ICD-10-CM | POA: Diagnosis not present

## 2022-08-30 DIAGNOSIS — R1319 Other dysphagia: Secondary | ICD-10-CM | POA: Diagnosis not present

## 2022-08-30 NOTE — Telephone Encounter (Signed)
If for some reason the symptoms get worse or having other problems please let me know and I will do the best we can to work her into our schedule thank you

## 2022-08-31 LAB — CBC WITH DIFFERENTIAL/PLATELET
Basophils Absolute: 0.1 10*3/uL (ref 0.0–0.2)
Basos: 1 %
EOS (ABSOLUTE): 0.5 10*3/uL — ABNORMAL HIGH (ref 0.0–0.4)
Eos: 6 %
Hematocrit: 42.9 % (ref 34.0–46.6)
Hemoglobin: 14.4 g/dL (ref 11.1–15.9)
Immature Grans (Abs): 0 10*3/uL (ref 0.0–0.1)
Immature Granulocytes: 0 %
Lymphocytes Absolute: 2.9 10*3/uL (ref 0.7–3.1)
Lymphs: 33 %
MCH: 30.5 pg (ref 26.6–33.0)
MCHC: 33.6 g/dL (ref 31.5–35.7)
MCV: 91 fL (ref 79–97)
Monocytes Absolute: 0.5 10*3/uL (ref 0.1–0.9)
Monocytes: 6 %
Neutrophils Absolute: 4.8 10*3/uL (ref 1.4–7.0)
Neutrophils: 54 %
Platelets: 338 10*3/uL (ref 150–450)
RBC: 4.72 x10E6/uL (ref 3.77–5.28)
RDW: 11.9 % (ref 11.7–15.4)
WBC: 8.8 10*3/uL (ref 3.4–10.8)

## 2022-08-31 LAB — BASIC METABOLIC PANEL
BUN/Creatinine Ratio: 7 — ABNORMAL LOW (ref 9–23)
BUN: 7 mg/dL (ref 6–24)
CO2: 22 mmol/L (ref 20–29)
Calcium: 9.4 mg/dL (ref 8.7–10.2)
Chloride: 96 mmol/L (ref 96–106)
Creatinine, Ser: 0.94 mg/dL (ref 0.57–1.00)
Glucose: 201 mg/dL — ABNORMAL HIGH (ref 70–99)
Potassium: 3.5 mmol/L (ref 3.5–5.2)
Sodium: 138 mmol/L (ref 134–144)
eGFR: 76 mL/min/{1.73_m2} (ref >59)

## 2022-08-31 LAB — HEPATIC FUNCTION PANEL
ALT: 54 IU/L — ABNORMAL HIGH (ref 0–32)
AST: 44 IU/L — ABNORMAL HIGH (ref 0–40)
Albumin: 4.2 g/dL (ref 3.9–4.9)
Alkaline Phosphatase: 82 IU/L (ref 44–121)
Bilirubin Total: 0.3 mg/dL (ref 0.0–1.2)
Bilirubin, Direct: <0.1 mg/dL (ref 0.00–0.40)
Total Protein: 7.4 g/dL (ref 6.0–8.5)

## 2022-08-31 LAB — LIPID PANEL
Chol/HDL Ratio: 3.9 ratio (ref 0.0–4.4)
Cholesterol, Total: 224 mg/dL — ABNORMAL HIGH (ref 100–199)
HDL: 57 mg/dL (ref >39)
LDL Chol Calc (NIH): 142 mg/dL — ABNORMAL HIGH (ref 0–99)
Triglycerides: 143 mg/dL (ref 0–149)
VLDL Cholesterol Cal: 25 mg/dL (ref 5–40)

## 2022-08-31 LAB — TSH+FREE T4
Free T4: 0.9 ng/dL (ref 0.82–1.77)
TSH: 0.69 u[IU]/mL (ref 0.450–4.500)

## 2022-09-03 ENCOUNTER — Telehealth: Payer: Self-pay | Admitting: Family Medicine

## 2022-09-03 ENCOUNTER — Encounter: Payer: Self-pay | Admitting: Family Medicine

## 2022-09-03 DIAGNOSIS — Z90710 Acquired absence of both cervix and uterus: Secondary | ICD-10-CM

## 2022-09-03 HISTORY — DX: Acquired absence of both cervix and uterus: Z90.710

## 2022-09-03 NOTE — Telephone Encounter (Signed)
Please let the patient know that I did communicate with Dr. Levon Hedger he did not feel any urgent test was necessary but did recommend going ahead and scheduling an office visit with gastroenterology for the first part of 2024 then they will address her esophageal issues as well as colonoscopy  Please go ahead with referral-can patient connect with their office as well thanks

## 2022-09-05 DIAGNOSIS — N390 Urinary tract infection, site not specified: Secondary | ICD-10-CM

## 2022-09-05 DIAGNOSIS — Z1211 Encounter for screening for malignant neoplasm of colon: Secondary | ICD-10-CM

## 2022-09-05 NOTE — Telephone Encounter (Signed)
My chart message sent to patient. Pt has appt with GI later in January

## 2022-09-07 ENCOUNTER — Encounter: Payer: Self-pay | Admitting: Family Medicine

## 2022-09-07 ENCOUNTER — Ambulatory Visit: Payer: BC Managed Care – PPO | Admitting: Family Medicine

## 2022-09-07 VITALS — BP 112/80 | HR 83 | Temp 97.7°F | Wt 171.0 lb

## 2022-09-07 DIAGNOSIS — R748 Abnormal levels of other serum enzymes: Secondary | ICD-10-CM | POA: Diagnosis not present

## 2022-09-07 DIAGNOSIS — Z719 Counseling, unspecified: Secondary | ICD-10-CM | POA: Diagnosis not present

## 2022-09-07 DIAGNOSIS — R7301 Impaired fasting glucose: Secondary | ICD-10-CM | POA: Diagnosis not present

## 2022-09-07 DIAGNOSIS — Z9229 Personal history of other drug therapy: Secondary | ICD-10-CM | POA: Diagnosis not present

## 2022-09-07 DIAGNOSIS — N898 Other specified noninflammatory disorders of vagina: Secondary | ICD-10-CM | POA: Diagnosis not present

## 2022-09-07 DIAGNOSIS — R300 Dysuria: Secondary | ICD-10-CM

## 2022-09-07 LAB — POCT URINALYSIS DIPSTICK
Spec Grav, UA: 1.02 (ref 1.010–1.025)
pH, UA: 7 (ref 5.0–8.0)

## 2022-09-07 NOTE — Progress Notes (Signed)
   Subjective:    Patient ID: Alicia Owens, female    DOB: 06/29/1976, 47 y.o.   MRN: [id number]  HPI Patient arrives today for follow up on UTI.  Patient is having issues bladder and lower back pain. Patient also states she is having polyuria. Patient lates increased urinary frequency.  Denies dysuria.  Denies high fever chills sweats. Recent lab work show elevated liver enzymes and elevated glucose Review of Systems     Objective:   Physical Exam  Lungs clear heart regular HEENT benign abdomen is soft nurse present for exam      Assessment & Plan:  Urine does not show any sign of infection I feel this problem is clearing itself As for her elevated glucose and liver enzymes I have encouraged her to work hard on a low carbohydrate healthy diet with regular activity try to lose weight if possible portion control information given repeat lab work in 6 weeks if abnormal she will need further intervention possible medicines

## 2022-09-07 NOTE — Patient Instructions (Signed)

## 2022-09-08 NOTE — Progress Notes (Signed)
Left message to return call 

## 2022-09-08 NOTE — Progress Notes (Signed)
Patient notified of provider's recommendations and verbalized understanding.  

## 2022-09-24 NOTE — Progress Notes (Deleted)
GI Office Note    Referring Provider: Kathyrn Drown, MD Primary Care Physician:  Kathyrn Drown, MD  Primary Gastroenterologist: Harvel Quale, MD ***  Chief Complaint   No chief complaint on file.   History of Present Illness   Alicia Owens is a 47 y.o. female presenting today at the request of Luking, Elayne Snare, MD for ***lower abdominal pain.   EGD March 2008: -***  Manometry March 2003: -***  Colonoscopy on 05/27/09 with Dr. Olevia Perches which showed colitis at the sigmoid colon. However, biopsies of both the left and right colon showed only benign colonic mucosa with no significant inflammation or other abnormalities identified. Patient was, however, continued on flagyl due to clinical symptoms.   EGD 2013: -***  CT A/P December 2022: IMPRESSION: 1. Mildly featureless appearance of the sigmoid colon and junction with the descending segment; a mild colitis difficult to exclude. This is the same region affected on the 2019 CT but there is no mesenteric inflammation or overt diverticulitis now. And only mild diverticula in the region. 2. No other acute or inflammatory process in the abdomen or pelvis.  Per previous PCP visit note she has experienced intermittent burning discomfort mid sternum and intermittent dysphagia and reflux. Symptoms can last for 15 minutes, sometimes 30-45 minutes. Advised to follow with GI for esophageal issues as well as colonoscopy.  Previous documented history of colitis. Treated multiple times with cipro/flagyl.   Underwent laparoscopic hysterectomy in August 2023 and cholecystectomy in 2013.   Labs 08/30/22: normal CBC, BMP unremarkable other than elevated glucose. Lipid panel with elevated total cholesterol and LDL. AST 44 (H), ALT 54 (H), alk phos 82, bili .3  Today:    Current Outpatient Medications  Medication Sig Dispense Refill   albuterol (VENTOLIN HFA) 108 (90 Base) MCG/ACT inhaler Inhale 2 puffs into the lungs every  6 (six) hours as needed for wheezing or shortness of breath. 18 g 5   ALPRAZolam (XANAX) 0.25 MG tablet TAKE ONE TABLET BY MOUTH TWICE DAILY AS NEEDED 30 tablet 0   busPIRone (BUSPAR) 15 MG tablet TAKE ONE TABLET BY MOUTH TWICE A DAY 180 tablet 1   citalopram (CELEXA) 10 MG tablet TAKE ONE TABLET (20MG TOTAL) BY MOUTH DAILY 90 tablet 1   CRANBERRY PO Take by mouth.     cyclobenzaprine (FLEXERIL) 10 MG tablet TAKE ONE (1) TABLET BY MOUTH 3 TIMES DAILY AS NEEDED (BETWEEN MEALS AND AT BEDTIME) 30 tablet 5   dicyclomine (BENTYL) 20 MG tablet TAKE ONE TABLET BY MOUTH THREE TIMES DAILY AS NEEDED OR AS DIRECTED 90 tablet 5   fexofenadine (ALLEGRA) 180 MG tablet Take 180 mg by mouth daily.     fluticasone (FLOVENT HFA) 220 MCG/ACT inhaler INHALE TWO PUFFS INTO THE LUNGS TWICE A DAY 12 g 5   furosemide (LASIX) 20 MG tablet Take one tablet po q am prn severe edema. Use sparingly 30 tablet 0   gabapentin (NEURONTIN) 100 MG capsule 2 qhs 60 capsule 5   hydrochlorothiazide (HYDRODIURIL) 25 MG tablet TAKE ONE TABLET BY MOUTH EVERY MORNING 90 tablet 0   ibuprofen (ADVIL) 600 MG tablet Take 1 tablet (600 mg total) by mouth every 6 (six) hours as needed. 30 tablet 0   Lactobacillus-Inulin ([place]) CAPS Take 1 capsule by mouth daily.     mometasone (NASONEX) 50 MCG/ACT nasal spray Place 2 sprays into the nose as needed.      montelukast (SINGULAIR) 10 MG tablet 1 qd  90 tablet 1   Multiple Vitamins-Minerals (MULTIVITAMIN WITH MINERALS) tablet Take 1 tablet by mouth daily.     mupirocin nasal ointment (BACTROBAN) 2 % Place 1 application. into the nose 2 (two) times daily. Use one-half of tube in each nostril twice daily for five (5) days. After application, press sides of nose together and gently massage. 10 g 0   ondansetron (ZOFRAN) 8 MG tablet TAKE ONE TABLET BY MOUTH THREE TO FOUR TIMES A DAY AS NEEDED 30 tablet 5   pantoprazole (PROTONIX) 40 MG tablet Take 1 tablet (40 mg total) by mouth  daily. 90 tablet 1   potassium chloride (KLOR-CON) 10 MEQ tablet TAKE ONE TABLET BY MOUTH ONCE A DAY AS NEEDED WITH HCTZ 90 tablet 1   SUMAtriptan (IMITREX) 100 MG tablet TAKE ONE TABLET BY MOUTH AT ONSET OF HEADACHE. MAY REPEAT ONE TIME IN TWO HOURS IF NO RELIEF. DO NOT EXCEED TWO TABLETS IN 24 HOURS. 9 tablet 5   SUPER B COMPLEX/C PO Take 1 tablet by mouth daily.      SV CRANBERRY PO Take 1 tablet by mouth daily.      topiramate (TOPAMAX) 50 MG tablet 3 in the evening 270 tablet 1   TURMERIC PO Take 1 capsule by mouth daily.      VITAMIN D, CHOLECALCIFEROL, PO Take by mouth 3 (three) times a week.     No current facility-administered medications for this visit.    Past Medical History:  Diagnosis Date   Anemia    Anxiety    Chronic abdominal pain    Depression    Gastroparesis    GERD (gastroesophageal reflux disease)    Headache(784.0)    History of abdominal hysterectomy 09/03/2022   Ovaries removed as well.  04/10/2022-Dr. [place]   Hypertension    Hyperthyroidism 2011   IBS (irritable bowel syndrome)    Iron deficiency anemia, unspecified 10/05/2019   More than likely this is due to heavy cycles.  Patient has ongoing history of heavy cycles.  IFBOT - January 2021   Menorrhagia 10/05/2019   PONV (postoperative nausea and vomiting)    PP care - C/S 12/17 08/22/2011    Past Surgical History:  Procedure Laterality Date   APPENDECTOMY     BASAL CELL CARCINOMA EXCISION  02/2006   face   BREAST SURGERY     Breast reduction   CESAREAN SECTION  2007 and 2012   x2   CHOLECYSTECTOMY  12/15/2011   Procedure: LAPAROSCOPIC CHOLECYSTECTOMY;  Surgeon: Jamesetta So, MD;  Location: [facility];  Service: General;  Laterality: N/A;   COLONOSCOPY     ESOPHAGOGASTRODUODENOSCOPY     TUBAL LIGATION     with last c-section   WISDOM TOOTH EXTRACTION  08/2003    Family History  Problem Relation Age of Onset   Healthy Daughter    Healthy Son    Hypertension Mother     Hypertension Father    Hyperlipidemia Father    Hyperlipidemia Other    Hypertension Other    Anesthesia problems Neg Hx    Hypotension Neg Hx    Malignant hyperthermia Neg Hx    Pseudochol deficiency Neg Hx     Allergies as of 09/25/2022 - Review Complete 08/24/2022  Allergen Reaction Noted   Ciprofloxacin  04/02/2018   Codeine  05/03/2009   Flagyl [metronidazole]  04/02/2018   Promethazine hcl     Reglan [metoclopramide]  11/18/2012   Sulfonamide derivatives  05/03/2009  Social History   Socioeconomic History   Marital status: Married    Spouse name: Not on file   Number of children: Not on file   Years of education: Not on file   Highest education level: Not on file  Occupational History   Not on file  Tobacco Use   Smoking status: Never   Smokeless tobacco: Never  Vaping Use   Vaping Use: Never used  Substance and Sexual Activity   Alcohol use: No   Drug use: No   Sexual activity: Yes    Birth control/protection: Surgical  Other Topics Concern   Not on file  Social History Narrative   Not on file   Social Determinants of Health   Financial Resource Strain: Not on file  Food Insecurity: Not on file  Transportation Needs: Not on file  Physical Activity: Not on file  Stress: Not on file  Social Connections: Not on file  Intimate Partner Violence: Not on file     Review of Systems   Gen: Denies any fever, chills, fatigue, weight loss, lack of appetite.  CV: Denies chest pain, heart palpitations, peripheral edema, syncope.  Resp: Denies shortness of breath at rest or with exertion. Denies wheezing or cough.  GI: see HPI GU : Denies urinary burning, urinary frequency, urinary hesitancy MS: Denies joint pain, muscle weakness, cramps, or limitation of movement.  Derm: Denies rash, itching, dry skin Psych: Denies depression, anxiety, memory loss, and confusion Heme: Denies bruising, bleeding, and enlarged lymph nodes.   Physical Exam   LMP  07/29/2021   General:   Alert and oriented. Pleasant and cooperative. Well-nourished and well-developed.  Head:  Normocephalic and atraumatic. Eyes:  Without icterus, sclera clear and conjunctiva pink.  Ears:  Normal auditory acuity. Mouth:  No deformity or lesions, oral mucosa pink.  Lungs:  Clear to auscultation bilaterally. No wheezes, rales, or rhonchi. No distress.  Heart:  S1, S2 present without murmurs appreciated.  Abdomen:  ***+BS, soft, non-tender and non-distended. No HSM noted. No guarding or rebound. No masses appreciated.  Rectal:  Deferred  Msk:  Symmetrical without gross deformities. Normal posture. Extremities:  Without edema. Neurologic:  Alert and  oriented x4;  grossly normal neurologically. Skin:  Intact without significant lesions or rashes. Psych:  Alert and cooperative. Normal mood and affect.   Assessment   Alicia Owens is a 47 y.o. female with a history of colitis, chronic abdominal pain, gastroparesis?, GERD, HTN, hyperthyroidism, IDA, depression, anxiety,  presenting today for evaluation of abdominal pain.   Abdominal pain:   GERD, dysphagia:   Screening colon cancer:    PLAN   *** Proceed with upper endoscopy with possible dilation*** and colonoscopy with propofol by Dr. Jenetta Downer in near future: the risks, benefits, and alternatives have been discussed with the patient in detail. The patient states understanding and desires to proceed. ASA 2    Venetia Night, MSN, FNP-BC, AGACNP-BC [facility]

## 2022-09-25 ENCOUNTER — Ambulatory Visit: Payer: BC Managed Care – PPO | Admitting: Gastroenterology

## 2022-09-25 NOTE — Telephone Encounter (Signed)
Hi Tanya I read over the messages In regards to all of this I would recommend the following 1.  Referral to urogynecology [place] Kelly Splinter have a very nice female urogynecologist who would be a good resource for this issue Diagnosis frequent UTIs #2 I would recommend [place] come by the office for the purpose of giving a urine specimen that we can do a UA, urine microscopic, and urine culture this could be done via nurses visit and then the results obviously sent to me #3 it was my understanding that [place] was combining Dr. Colman Cater office with Dr.Rourke's office so it would be fine to request appointment with Dr. Jenetta Downer but it is quite possible that they are all in the same location currently  It would be fine to share this message with [facility] Giving [place] a urine specimen would allow [place] to do further analysis and culture Also consultation with urogynecology would be a good step Thanks-Dr. Nicki Reaper

## 2022-09-27 ENCOUNTER — Other Ambulatory Visit: Payer: Self-pay

## 2022-09-27 DIAGNOSIS — R300 Dysuria: Secondary | ICD-10-CM | POA: Diagnosis not present

## 2022-09-29 LAB — URINE CULTURE

## 2022-10-03 ENCOUNTER — Encounter (INDEPENDENT_AMBULATORY_CARE_PROVIDER_SITE_OTHER): Payer: Self-pay | Admitting: *Deleted

## 2022-11-22 ENCOUNTER — Other Ambulatory Visit: Payer: Self-pay | Admitting: Family Medicine

## 2022-11-28 ENCOUNTER — Encounter: Payer: Self-pay | Admitting: Family Medicine

## 2022-11-28 DIAGNOSIS — R300 Dysuria: Secondary | ICD-10-CM | POA: Diagnosis not present

## 2022-11-28 DIAGNOSIS — R399 Unspecified symptoms and signs involving the genitourinary system: Secondary | ICD-10-CM | POA: Diagnosis not present

## 2022-11-28 DIAGNOSIS — R051 Acute cough: Secondary | ICD-10-CM | POA: Diagnosis not present

## 2022-11-28 DIAGNOSIS — J00 Acute nasopharyngitis [common cold]: Secondary | ICD-10-CM | POA: Diagnosis not present

## 2022-11-28 NOTE — Telephone Encounter (Signed)
Qvar 80 mcg 2 puffs twice daily 1 inhaler with 2 refills  Rinse after use This is a similar to Flovent should be covered if not then we will have to connect with her insurance company

## 2022-11-29 MED ORDER — QVAR REDIHALER 80 MCG/ACT IN AERB: INHALATION_SPRAY | RESPIRATORY_TRACT | 2 refills | Status: DC

## 2022-11-30 ENCOUNTER — Other Ambulatory Visit: Payer: Self-pay | Admitting: Family Medicine

## 2022-12-27 ENCOUNTER — Other Ambulatory Visit: Payer: Self-pay | Admitting: Family Medicine

## 2023-01-27 ENCOUNTER — Other Ambulatory Visit: Payer: Self-pay | Admitting: Family Medicine

## 2023-01-30 ENCOUNTER — Ambulatory Visit (INDEPENDENT_AMBULATORY_CARE_PROVIDER_SITE_OTHER): Payer: 59 | Admitting: Family Medicine

## 2023-01-30 ENCOUNTER — Encounter: Payer: Self-pay | Admitting: Family Medicine

## 2023-01-30 VITALS — BP 124/79 | HR 75 | Ht 61.5 in | Wt 168.2 lb

## 2023-01-30 DIAGNOSIS — I10 Essential (primary) hypertension: Secondary | ICD-10-CM

## 2023-01-30 DIAGNOSIS — R7303 Prediabetes: Secondary | ICD-10-CM

## 2023-01-30 DIAGNOSIS — E785 Hyperlipidemia, unspecified: Secondary | ICD-10-CM

## 2023-01-30 DIAGNOSIS — R21 Rash and other nonspecific skin eruption: Secondary | ICD-10-CM

## 2023-01-30 MED ORDER — CLOBETASOL PROPIONATE 0.05 % EX OINT: 1.0000 | TOPICAL_OINTMENT | Freq: Two times a day (BID) | CUTANEOUS | 0 refills | Status: DC

## 2023-01-30 MED ORDER — VALACYCLOVIR HCL 1 G PO TABS: 1000.0000 mg | ORAL_TABLET | Freq: Three times a day (TID) | ORAL | 0 refills | Status: DC

## 2023-01-30 NOTE — Progress Notes (Signed)
Subjective:  Patient ID: Alicia Owens, female    DOB: 1975-10-07  Age: 47 y.o. MRN: [id number]  CC: Chief Complaint  Patient presents with   Herpes Zoster    Patient thinks she has shingles    HPI:  47 year old female presents for evaluation of rash.  Patient reports that she developed pain in the left flank on Saturday.  Subsequently developed a raised and erythematous rash.  She is concerned that she has shingles.  Rash is slightly itchy.  She does note continued pain.  No relieving factors.  No other associated symptoms.  No other complaints.  Patient Active Problem List   Diagnosis Date Noted   Rash 01/30/2023   History of abdominal hysterectomy 09/03/2022   Moderate persistent asthma 08/30/2020   Diverticulitis 04/02/2018   Pedal edema 11/21/2017   IBS (irritable bowel syndrome) 07/22/2013   Migraines 12/17/2012   Hypertension 10/16/2011   Elevated liver enzymes 10/16/2011   ANXIETY DEPRESSION 08/20/2009   GERD 05/03/2009    Social Hx   Social History   Socioeconomic History   Marital status: Married    Spouse name: Not on file   Number of children: Not on file   Years of education: Not on file   Highest education level: Not on file  Occupational History   Not on file  Tobacco Use   Smoking status: Never   Smokeless tobacco: Never  Vaping Use   Vaping Use: Never used  Substance and Sexual Activity   Alcohol use: No   Drug use: No   Sexual activity: Yes    Birth control/protection: Surgical  Other Topics Concern   Not on file  Social History Narrative   Not on file   Social Determinants of Health   Financial Resource Strain: Not on file  Food Insecurity: Not on file  Transportation Needs: Not on file  Physical Activity: Not on file  Stress: Not on file  Social Connections: Not on file    Review of Systems Per HPI  Objective:  BP 124/79   Pulse 75   Ht 5' 1.5" (1.562 m)   Wt 168 lb 3.2 oz (76.3 kg)   LMP 07/29/2021   SpO2 95%   BMI  31.27 kg/m      01/30/2023    9:42 AM 09/07/2022   11:51 AM 08/24/2022    9:26 AM  BP/Weight  Systolic BP 124 112 110  Diastolic BP 79 80 78  Wt. (Lbs) 168.2 171 169  BMI 31.27 kg/m2 32.31 kg/m2 31.93 kg/m2    Physical Exam Constitutional:      General: She is not in acute distress.    Appearance: Normal appearance.  HENT:     Head: Normocephalic and atraumatic.  Eyes:     General:        Right eye: No discharge.        Left eye: No discharge.     Conjunctiva/sclera: Conjunctivae normal.  Pulmonary:     Effort: Pulmonary effort is normal. No respiratory distress.  Abdominal:     Comments: Left flank with a discrete area of erythema.  It is slightly raised.  No appreciable vesicles at this time.  Neurological:     Mental Status: She is alert.  Psychiatric:        Mood and Affect: Mood normal.        Behavior: Behavior normal.     Lab Results  Component Value Date   WBC 8.8 08/30/2022   HGB 14.4  08/30/2022   HCT 42.9 08/30/2022   PLT 338 08/30/2022   GLUCOSE 201 (H) 08/30/2022   CHOL 224 (H) 08/30/2022   TRIG 143 08/30/2022   HDL 57 08/30/2022   LDLCALC 142 (H) 08/30/2022   ALT 54 (H) 08/30/2022   AST 44 (H) 08/30/2022   NA 138 08/30/2022   K 3.5 08/30/2022   CL 96 08/30/2022   CREATININE 0.94 08/30/2022   BUN 7 08/30/2022   CO2 22 08/30/2022   TSH 0.690 08/30/2022   HGBA1C 6.2 (H) 11/03/2021     Assessment & Plan:   Problem List Items Addressed This Visit       Musculoskeletal and Integument   Rash - Primary    Concern for shingles.  Placing on Valtrex.  Clobetasol as directed.       Meds ordered this encounter  Medications   valACYclovir (VALTREX) 1000 MG tablet    Sig: Take 1 tablet (1,000 mg total) by mouth 3 (three) times daily.    Dispense:  21 tablet    Refill:  0   clobetasol ointment (TEMOVATE) 0.05 %    Sig: Apply 1 Application topically 2 (two) times daily.    Dispense:  30 g    Refill:  0   Caydance Kuehnle DO [facility]

## 2023-01-30 NOTE — Assessment & Plan Note (Signed)
Concern for shingles.  Placing on Valtrex.  Clobetasol as directed.

## 2023-02-02 NOTE — Telephone Encounter (Signed)
Nurses I recommend A1c, urine ACR, metabolic 7, lipid, liver  Hyperlipidemia, hypertension, prediabetes Please also assist with scheduling an office visit (Currently I am uncertain how far out the schedule goes-it has been booking up in advance hopefully she can be seen in June if not June early July thank you)

## 2023-02-06 ENCOUNTER — Emergency Department (HOSPITAL_COMMUNITY): Payer: 59

## 2023-02-06 ENCOUNTER — Encounter (HOSPITAL_COMMUNITY): Payer: Self-pay | Admitting: Emergency Medicine

## 2023-02-06 ENCOUNTER — Other Ambulatory Visit: Payer: Self-pay

## 2023-02-06 ENCOUNTER — Emergency Department (HOSPITAL_COMMUNITY)
Admission: EM | Admit: 2023-02-06 | Discharge: 2023-02-06 | Disposition: A | Discharge status: Home / Self Care | Payer: 59 | Attending: Emergency Medicine | Admitting: Emergency Medicine

## 2023-02-06 DIAGNOSIS — H9209 Otalgia, unspecified ear: Secondary | ICD-10-CM | POA: Insufficient documentation

## 2023-02-06 DIAGNOSIS — Z79899 Other long term (current) drug therapy: Secondary | ICD-10-CM | POA: Insufficient documentation

## 2023-02-06 DIAGNOSIS — R1032 Left lower quadrant pain: Secondary | ICD-10-CM | POA: Diagnosis not present

## 2023-02-06 DIAGNOSIS — R059 Cough, unspecified: Secondary | ICD-10-CM | POA: Diagnosis present

## 2023-02-06 DIAGNOSIS — Z20822 Contact with and (suspected) exposure to covid-19: Secondary | ICD-10-CM | POA: Insufficient documentation

## 2023-02-06 DIAGNOSIS — E876 Hypokalemia: Secondary | ICD-10-CM | POA: Insufficient documentation

## 2023-02-06 DIAGNOSIS — I10 Essential (primary) hypertension: Secondary | ICD-10-CM | POA: Diagnosis not present

## 2023-02-06 DIAGNOSIS — J40 Bronchitis, not specified as acute or chronic: Secondary | ICD-10-CM | POA: Insufficient documentation

## 2023-02-06 LAB — COMPREHENSIVE METABOLIC PANEL
ALT: 46 U/L — ABNORMAL HIGH (ref 0–44)
AST: 53 U/L — ABNORMAL HIGH (ref 15–41)
Albumin: 4.4 g/dL (ref 3.5–5.0)
Alkaline Phosphatase: 92 U/L (ref 38–126)
Anion gap: 12 (ref 5–15)
BUN: 5 mg/dL — ABNORMAL LOW (ref 6–20)
CO2: 26 mmol/L (ref 22–32)
Calcium: 9.2 mg/dL (ref 8.9–10.3)
Chloride: 88 mmol/L — ABNORMAL LOW (ref 98–111)
Creatinine, Ser: 0.73 mg/dL (ref 0.44–1.00)
GFR, Estimated: >60 mL/min (ref >60)
Glucose, Bld: 121 mg/dL — ABNORMAL HIGH (ref 70–99)
Potassium: 2.6 mmol/L — CL (ref 3.5–5.1)
Sodium: 126 mmol/L — ABNORMAL LOW (ref 135–145)
Total Bilirubin: 0.8 mg/dL (ref 0.3–1.2)
Total Protein: 8.7 g/dL — ABNORMAL HIGH (ref 6.5–8.1)

## 2023-02-06 LAB — URINALYSIS, ROUTINE W REFLEX MICROSCOPIC
Bilirubin Urine: NEGATIVE
Glucose, UA: NEGATIVE mg/dL
Hgb urine dipstick: NEGATIVE
Ketones, ur: NEGATIVE mg/dL
Leukocytes,Ua: NEGATIVE
Nitrite: NEGATIVE
Protein, ur: NEGATIVE mg/dL
Specific Gravity, Urine: 1.002 — ABNORMAL LOW (ref 1.005–1.030)
pH: 7 (ref 5.0–8.0)

## 2023-02-06 LAB — LIPASE, BLOOD: Lipase: 36 U/L (ref 11–51)

## 2023-02-06 LAB — RESP PANEL BY RT-PCR (RSV, FLU A&B, COVID)  RVPGX2
Influenza A by PCR: NEGATIVE
Influenza B by PCR: NEGATIVE
Resp Syncytial Virus by PCR: NEGATIVE
SARS Coronavirus 2 by RT PCR: NEGATIVE

## 2023-02-06 LAB — CBC WITH DIFFERENTIAL/PLATELET
Abs Immature Granulocytes: 0.03 10*3/uL (ref 0.00–0.07)
Basophils Absolute: 0.1 10*3/uL (ref 0.0–0.1)
Basophils Relative: 1 %
Eosinophils Absolute: 0.3 10*3/uL (ref 0.0–0.5)
Eosinophils Relative: 2 %
HCT: 43.6 % (ref 36.0–46.0)
Hemoglobin: 15.7 g/dL — ABNORMAL HIGH (ref 12.0–15.0)
Immature Granulocytes: 0 %
Lymphocytes Relative: 20 %
Lymphs Abs: 2.6 10*3/uL (ref 0.7–4.0)
MCH: 31.3 pg (ref 26.0–34.0)
MCHC: 36 g/dL (ref 30.0–36.0)
MCV: 86.9 fL (ref 80.0–100.0)
Monocytes Absolute: 0.7 10*3/uL (ref 0.1–1.0)
Monocytes Relative: 5 %
Neutro Abs: 9.2 10*3/uL — ABNORMAL HIGH (ref 1.7–7.7)
Neutrophils Relative %: 72 %
Platelets: 390 10*3/uL (ref 150–400)
RBC: 5.02 MIL/uL (ref 3.87–5.11)
RDW: 11.8 % (ref 11.5–15.5)
WBC: 12.8 10*3/uL — ABNORMAL HIGH (ref 4.0–10.5)
nRBC: 0 % (ref 0.0–0.2)

## 2023-02-06 MED ORDER — ONDANSETRON 4 MG PO TBDP: ORAL_TABLET | ORAL | 0 refills | Status: DC

## 2023-02-06 MED ORDER — ACETAMINOPHEN 325 MG PO TABS
650.0000 mg | ORAL_TABLET | Freq: Once | ORAL | Status: AC
  Administered 2023-02-06: 650 mg via ORAL
  Filled 2023-02-06: qty 2

## 2023-02-06 MED ORDER — POTASSIUM CHLORIDE CRYS ER 20 MEQ PO TBCR
20.0000 meq | EXTENDED_RELEASE_TABLET | Freq: Once | ORAL | Status: AC
  Administered 2023-02-06: 20 meq via ORAL
  Filled 2023-02-06: qty 1

## 2023-02-06 MED ORDER — BENZONATATE 100 MG PO CAPS: 100.0000 mg | ORAL_CAPSULE | Freq: Three times a day (TID) | ORAL | 0 refills | Status: DC | PRN

## 2023-02-06 MED ORDER — POTASSIUM CHLORIDE 10 MEQ/100ML IV SOLN
10.0000 meq | Freq: Once | INTRAVENOUS | Status: AC
  Administered 2023-02-06: 10 meq via INTRAVENOUS
  Filled 2023-02-06: qty 100

## 2023-02-06 MED ORDER — SODIUM CHLORIDE 0.9 % IV BOLUS
1000.0000 mL | Freq: Once | INTRAVENOUS | Status: AC
  Administered 2023-02-06: 1000 mL via INTRAVENOUS

## 2023-02-06 MED ORDER — IOHEXOL 300 MG/ML  SOLN
100.0000 mL | Freq: Once | INTRAMUSCULAR | Status: AC | PRN
  Administered 2023-02-06: 100 mL via INTRAVENOUS

## 2023-02-06 MED ORDER — DOXYCYCLINE HYCLATE 100 MG PO CAPS: 100.0000 mg | ORAL_CAPSULE | Freq: Two times a day (BID) | ORAL | 0 refills | Status: DC

## 2023-02-06 MED ORDER — POTASSIUM CHLORIDE CRYS ER 20 MEQ PO TBCR: 20.0000 meq | EXTENDED_RELEASE_TABLET | Freq: Every day | ORAL | 0 refills | Status: DC

## 2023-02-06 MED ORDER — ONDANSETRON HCL 4 MG/2ML IJ SOLN
4.0000 mg | Freq: Once | INTRAMUSCULAR | Status: DC
  Filled 2023-02-06: qty 2

## 2023-02-06 MED ORDER — SODIUM CHLORIDE 0.9 % IV BOLUS
500.0000 mL | Freq: Once | INTRAVENOUS | Status: AC
  Administered 2023-02-06: 500 mL via INTRAVENOUS

## 2023-02-06 MED ORDER — ONDANSETRON HCL 4 MG/2ML IJ SOLN
4.0000 mg | Freq: Once | INTRAMUSCULAR | Status: AC
  Administered 2023-02-06: 4 mg via INTRAVENOUS
  Filled 2023-02-06: qty 2

## 2023-02-06 MED ORDER — POTASSIUM CHLORIDE CRYS ER 20 MEQ PO TBCR
40.0000 meq | EXTENDED_RELEASE_TABLET | Freq: Once | ORAL | Status: AC
  Administered 2023-02-06: 40 meq via ORAL
  Filled 2023-02-06: qty 2

## 2023-02-06 MED ORDER — HYDROMORPHONE HCL 1 MG/ML IJ SOLN
0.5000 mg | Freq: Once | INTRAMUSCULAR | Status: AC
  Administered 2023-02-06: 0.5 mg via INTRAVENOUS
  Filled 2023-02-06: qty 0.5

## 2023-02-06 MED ORDER — ONDANSETRON HCL 4 MG/2ML IJ SOLN
4.0000 mg | Freq: Once | INTRAMUSCULAR | Status: AC
  Administered 2023-02-06: 4 mg via INTRAVENOUS

## 2023-02-06 NOTE — ED Notes (Signed)
Pt wanting med for headache. Edp aware

## 2023-02-06 NOTE — ED Notes (Signed)
Pt dropped half of a potassium tablet, had to reorder one more tablet and gave half.

## 2023-02-06 NOTE — Discharge Instructions (Signed)
Follow-up with your family doctor in 2 to 3 days for recheck of your potassium and symptoms

## 2023-02-06 NOTE — ED Notes (Signed)
Pt placed on heart monitor.

## 2023-02-06 NOTE — ED Provider Notes (Signed)
[place] EMERGENCY DEPARTMENT AT [facility] Provider Note   CSN: [id number] Arrival date & time: 02/06/23  4098     History {Add pertinent medical, surgical, social history, OB history to HPI:1} Chief Complaint  Patient presents with   Cough   Otalgia   Abdominal Pain    Alicia Owens is a 47 y.o. female.  Patient complains of cough and some left lower quadrant pain.  She has been on amoxicillin   Cough Associated symptoms: ear pain   Otalgia Associated symptoms: abdominal pain and cough   Abdominal Pain Associated symptoms: cough        Home Medications Prior to Admission medications   Medication Sig Start Date End Date Taking? Authorizing Provider  benzonatate (TESSALON) 100 MG capsule Take 1 capsule (100 mg total) by mouth 3 (three) times daily as needed for cough. 02/06/23  Yes Bethann Berkshire, MD  doxycycline (VIBRAMYCIN) 100 MG capsule Take 1 capsule (100 mg total) by mouth 2 (two) times daily. One po bid x 7 days 02/06/23  Yes Bethann Berkshire, MD  ondansetron (ZOFRAN-ODT) 4 MG disintegrating tablet 4mg  ODT q4 hours prn nausea/vomit 02/06/23  Yes Bethann Berkshire, MD  potassium chloride SA (KLOR-CON M) 20 MEQ tablet Take 1 tablet (20 mEq total) by mouth daily. 02/06/23  Yes Bethann Berkshire, MD  albuterol (VENTOLIN HFA) 108 (90 Base) MCG/ACT inhaler Inhale 2 puffs into the lungs every 6 (six) hours as needed for wheezing or shortness of breath. 04/12/21   Babs Sciara, MD  ALPRAZolam Prudy Feeler) 0.25 MG tablet TAKE ONE TABLET BY MOUTH TWICE DAILY AS NEEDED 08/24/22   Babs Sciara, MD  beclomethasone (QVAR REDIHALER) 80 MCG/ACT inhaler 2 puffs twice daily rinse mouth after use 11/29/22   Luking, Jonna Coup, MD  busPIRone (BUSPAR) 15 MG tablet TAKE ONE TABLET BY MOUTH TWICE A DAY 01/31/23   Babs Sciara, MD  citalopram (CELEXA) 10 MG tablet TAKE ONE TABLET (20MG  TOTAL) BY MOUTH DAILY 08/24/22   Babs Sciara, MD  clobetasol ointment (TEMOVATE) 0.05 % Apply 1 Application  topically 2 (two) times daily. 01/30/23   Cook, Verdis Frederickson, DO  CRANBERRY PO Take by mouth.    [provider]  cyclobenzaprine (FLEXERIL) 10 MG tablet TAKE ONE (1) TABLET BY MOUTH 3 TIMES DAILY AS NEEDED (BETWEEN MEALS AND AT BEDTIME) 05/18/22   Luking, Jonna Coup, MD  dicyclomine (BENTYL) 20 MG tablet TAKE ONE TABLET BY MOUTH THREE TIMES DAILY AS NEEDED OR AS DIRECTED 12/05/22   Babs Sciara, MD  fexofenadine (ALLEGRA) 180 MG tablet Take 180 mg by mouth daily.    [provider]  furosemide (LASIX) 20 MG tablet Take one tablet po q am prn severe edema. Use sparingly 10/06/21   Babs Sciara, MD  gabapentin (NEURONTIN) 100 MG capsule 2 qhs 08/24/22   Babs Sciara, MD  hydrochlorothiazide (HYDRODIURIL) 25 MG tablet TAKE ONE TABLET BY MOUTH EVERY MORNING 11/22/22   Babs Sciara, MD  ibuprofen (ADVIL) 600 MG tablet Take 1 tablet (600 mg total) by mouth every 6 (six) hours as needed. Patient not taking: Reported on 02/06/2023 08/11/21   Domenick Gong, MD  Lactobacillus-Inulin (CULTURELLE DIGESTIVE HEALTH) CAPS Take 1 capsule by mouth daily.    [provider]  mometasone (NASONEX) 50 MCG/ACT nasal spray Place 2 sprays into the nose as needed.     [provider]  montelukast (SINGULAIR) 10 MG tablet 1 qd 08/24/22   Babs Sciara,  MD  Multiple Vitamins-Minerals (MULTIVITAMIN WITH MINERALS) tablet Take 1 tablet by mouth daily.    [provider]  mupirocin nasal ointment (BACTROBAN) 2 % Place 1 application. into the nose 2 (two) times daily. Use one-half of tube in each nostril twice daily for five (5) days. After application, press sides of nose together and gently massage. 11/11/21   Cook, Dorie Rank G, DO  ondansetron (ZOFRAN) 8 MG tablet TAKE ONE TABLET BY MOUTH THREE TO FOUR TIMES A DAY AS NEEDED 12/05/22   Babs Sciara, MD  pantoprazole (PROTONIX) 40 MG tablet Take 1 tablet (40 mg total) by mouth daily. 08/24/22   Babs Sciara, MD  potassium chloride  (KLOR-CON) 10 MEQ tablet TAKE ONE TABLET BY MOUTH ONCE A DAY AS NEEDED WITH HCTZ 08/24/22   Luking, Jonna Coup, MD  SUMAtriptan (IMITREX) 100 MG tablet TAKE ONE TABLET BY MOUTH AT ONSET OF HEADACHE. MAY REPEAT ONE TIME IN TWO HOURS IF NO RELIEF. DO NOT EXCEED TWO TABLETS IN 24 HOURS. 08/24/22   Babs Sciara, MD  SUPER B COMPLEX/C PO Take 1 tablet by mouth daily.     [provider]  SV CRANBERRY PO Take 1 tablet by mouth daily.     [provider]  topiramate (TOPAMAX) 50 MG tablet 3 in the evening 08/24/22   Luking, Jonna Coup, MD  TURMERIC PO Take 1 capsule by mouth daily.     [provider]  valACYclovir (VALTREX) 1000 MG tablet Take 1 tablet (1,000 mg total) by mouth 3 (three) times daily. 01/30/23   Tommie Sams, DO  VITAMIN D, CHOLECALCIFEROL, PO Take by mouth 3 (three) times a week.    [provider]      Allergies    Ciprofloxacin, Codeine, Flagyl [metronidazole], Promethazine hcl, Reglan [metoclopramide], and Sulfonamide derivatives    Review of Systems   Review of Systems  HENT:  Positive for ear pain.   Respiratory:  Positive for cough.   Gastrointestinal:  Positive for abdominal pain.    Physical Exam Updated Vital Signs BP 116/77   Pulse 72   Temp 98.8 F (37.1 C) (Oral)   Resp 10   Ht 5' 1.5" (1.562 m)   Wt 75.3 kg   LMP 07/29/2021   SpO2 100%   BMI 30.86 kg/m  Physical Exam  ED Results / Procedures / Treatments   Labs (all labs ordered are listed, but only abnormal results are displayed) Labs Reviewed  CBC WITH DIFFERENTIAL/PLATELET - Abnormal; Notable for the following components:      Result Value   WBC 12.8 (*)    Hemoglobin 15.7 (*)    Neutro Abs 9.2 (*)    All other components within normal limits  COMPREHENSIVE METABOLIC PANEL - Abnormal; Notable for the following components:   Sodium 126 (*)    Potassium 2.6 (*)    Chloride 88 (*)    Glucose, Bld 121 (*)    BUN 5 (*)    Total Protein 8.7 (*)    AST 53 (*)     ALT 46 (*)    All other components within normal limits  URINALYSIS, ROUTINE W REFLEX MICROSCOPIC - Abnormal; Notable for the following components:   Color, Urine STRAW (*)    Specific Gravity, Urine 1.002 (*)    All other components within normal limits  RESP PANEL BY RT-PCR (RSV, FLU A&B, COVID)  RVPGX2  LIPASE, BLOOD    EKG EKG Interpretation  Date/Time:  Tuesday February 06 2023 10:55:27 EDT Ventricular Rate:  77 PR Interval:  177 QRS Duration: 111 QT Interval:  427 QTC Calculation: 484 R Axis:   110 Text Interpretation: Sinus rhythm Right axis deviation Consider anterior infarct Confirmed by Bethann Berkshire [phone]) on 02/06/2023 12:58:08 PM  Radiology CT ABDOMEN PELVIS W CONTRAST  Result Date: 02/06/2023 CLINICAL DATA:  Intermittent left-sided abdominal pain for 4 days. EXAM: CT ABDOMEN AND PELVIS WITH CONTRAST TECHNIQUE: Multidetector CT imaging of the abdomen and pelvis was performed using the standard protocol following bolus administration of intravenous contrast. RADIATION DOSE REDUCTION: This exam was performed according to the departmental dose-optimization program which includes automated exposure control, adjustment of the mA and/or kV according to patient size and/or use of iterative reconstruction technique. CONTRAST:  OMNIPAQUE IOHEXOL 300 MG/ML  SOLN COMPARISON:  CT 08/14/2021 and older FINDINGS: Lower chest: There is some linear opacity along the lung bases likely scar or atelectasis. Mild breathing motion. No pleural effusion. Coronary artery calcifications are seen. Please correlate for other coronary risk factors. Hepatobiliary: Fatty liver infiltration. Previous cholecystectomy. Patent portal vein. Pancreas: Unremarkable. No pancreatic ductal dilatation or surrounding inflammatory changes. Spleen: Normal in size without focal abnormality. Adrenals/Urinary Tract: Adrenal glands are unremarkable. Kidneys are normal, without renal calculi, focal lesion, or hydronephrosis.  Bladder is unremarkable. Stomach/Bowel: Surgical changes along the base of the cecum. Please correlate for history such as a prior appendectomy. No oral contrast. Stomach is nondilated. Small bowel is nondilated. Large bowel has a normal course and caliber with mild scattered colonic stool. Few left-sided colonic diverticula. Vascular/Lymphatic: Aortic atherosclerosis. No enlarged abdominal or pelvic lymph nodes. Retroaortic left renal vein identified. Reproductive: Status post hysterectomy. No adnexal masses. Other: Small fat containing umbilical hernia. No free air or free fluid. Musculoskeletal: No acute or significant osseous findings. IMPRESSION: No bowel obstruction, free air or free fluid. Few colonic diverticula. Fatty liver infiltration. Electronically Signed   By: Karen Kays M.D.   On: 02/06/2023 11:45   DG Chest 2 View  Result Date: 02/06/2023 CLINICAL DATA:  Cough, congestion, chest pain. EXAM: CHEST - 2 VIEW COMPARISON:  Chest radiograph 11/21/2017 FINDINGS: The cardiomediastinal silhouette is normal There is no focal consolidation or pulmonary edema. There is no pleural effusion or pneumothorax There is no acute osseous abnormality. Cholecystectomy clips are noted. IMPRESSION: No radiographic evidence of acute cardiopulmonary process. Electronically Signed   By: Lesia Hausen M.D.   On: 02/06/2023 08:07    Procedures Procedures  {Document cardiac monitor, telemetry assessment procedure when appropriate:1}  Medications Ordered in ED Medications  sodium chloride 0.9 % bolus 500 mL (0 mLs Intravenous Stopped 02/06/23 1046)  HYDROmorphone (DILAUDID) injection 0.5 mg (0.5 mg Intravenous Given 02/06/23 1008)  ondansetron (ZOFRAN) injection 4 mg (4 mg Intravenous Given 02/06/23 1008)  potassium chloride 10 mEq in 100 mL IVPB (0 mEq Intravenous Stopped 02/06/23 1155)  iohexol (OMNIPAQUE) 300 MG/ML solution 100 mL (100 mLs Intravenous Contrast Given 02/06/23 1111)  ondansetron (ZOFRAN) injection 4 mg (4  mg Intravenous Given 02/06/23 1305)  sodium chloride 0.9 % bolus 1,000 mL (0 mLs Intravenous Stopped 02/06/23 1443)  acetaminophen (TYLENOL) tablet 650 mg (650 mg Oral Given 02/06/23 1354)  potassium chloride SA (KLOR-CON M) CR tablet 40 mEq (40 mEq Oral Given 02/06/23 1440)  potassium chloride SA (KLOR-CON M) CR tablet 20 mEq (20 mEq Oral Given 02/06/23 1448)    ED Course/ Medical Decision Making/ A&P   {   Click here for ABCD2, HEART and other calculatorsREFRESH  Note before signing :1}                          Medical Decision Making Amount and/or Complexity of Data Reviewed Labs: ordered. Radiology: ordered.  Risk OTC drugs. Prescription drug management.   Patient with hypokalemia and bronchitis.  She is sent home with doxycycline and potassium and will follow-up with PCP {Document critical care time when appropriate:1} {Document review of labs and clinical decision tools ie heart score, Chads2Vasc2 etc:1}  {Document your independent review of radiology images, and any outside records:1} {Document your discussion with family members, caretakers, and with consultants:1} {Document social determinants of health affecting pt's care:1} {Document your decision making why or why not admission, treatments were needed:1} Final Clinical Impression(s) / ED Diagnoses Final diagnoses:  Hypokalemia  Bronchitis    Rx / DC Orders ED Discharge Orders          Ordered    doxycycline (VIBRAMYCIN) 100 MG capsule  2 times daily        02/06/23 1515    ondansetron (ZOFRAN-ODT) 4 MG disintegrating tablet        02/06/23 1515    potassium chloride SA (KLOR-CON M) 20 MEQ tablet  Daily        02/06/23 1515    benzonatate (TESSALON) 100 MG capsule  3 times daily PRN        02/06/23 1515

## 2023-02-06 NOTE — ED Triage Notes (Signed)
Pt co prod cough with yellow sputum, pain to left ear and behind x 5 days. Seen at urgent care and finishing abx this Saturday for URI. Pt a/o. Congested cough noted. Color wnl. Nad. No resp distress or sob noted. Pt c/o pain to lower left abd pain x 4 days intermittent. Denies gu, urine had blood in it at urgent care. Pain does not radiate. Lnbm x 2 days ago.

## 2023-02-11 ENCOUNTER — Encounter: Payer: Self-pay | Admitting: Family Medicine

## 2023-02-15 ENCOUNTER — Other Ambulatory Visit: Payer: Self-pay | Admitting: Family Medicine

## 2023-02-15 DIAGNOSIS — J4540 Moderate persistent asthma, uncomplicated: Secondary | ICD-10-CM

## 2023-02-20 ENCOUNTER — Ambulatory Visit: Payer: Medicare Other | Admitting: Family Medicine

## 2023-02-26 ENCOUNTER — Encounter: Payer: Self-pay | Admitting: Family Medicine

## 2023-02-26 ENCOUNTER — Other Ambulatory Visit: Payer: Self-pay | Admitting: Nurse Practitioner

## 2023-02-26 MED ORDER — DOXYCYCLINE HYCLATE 100 MG PO CAPS: 100.0000 mg | ORAL_CAPSULE | Freq: Two times a day (BID) | ORAL | 0 refills | Status: DC

## 2023-02-27 ENCOUNTER — Ambulatory Visit: Payer: BC Managed Care – PPO | Admitting: Family Medicine

## 2023-03-03 LAB — HEPATIC FUNCTION PANEL
ALT: 73 IU/L — ABNORMAL HIGH (ref 0–32)
AST: 64 IU/L — ABNORMAL HIGH (ref 0–40)
Albumin: 4.5 g/dL (ref 3.9–4.9)
Alkaline Phosphatase: 88 IU/L (ref 44–121)
Bilirubin Total: 0.4 mg/dL (ref 0.0–1.2)
Bilirubin, Direct: 0.12 mg/dL (ref 0.00–0.40)
Total Protein: 7.6 g/dL (ref 6.0–8.5)

## 2023-03-03 LAB — MICROALBUMIN / CREATININE URINE RATIO
Creatinine, Urine: 107.7 mg/dL
Microalb/Creat Ratio: <3 mg/g creat (ref 0–29)
Microalbumin, Urine: <3 ug/mL

## 2023-03-03 LAB — BASIC METABOLIC PANEL
BUN/Creatinine Ratio: 12 (ref 9–23)
BUN: 11 mg/dL (ref 6–24)
CO2: 26 mmol/L (ref 20–29)
Calcium: 10.2 mg/dL (ref 8.7–10.2)
Chloride: 97 mmol/L (ref 96–106)
Creatinine, Ser: 0.89 mg/dL (ref 0.57–1.00)
Glucose: 126 mg/dL — ABNORMAL HIGH (ref 70–99)
Potassium: 4.1 mmol/L (ref 3.5–5.2)
Sodium: 141 mmol/L (ref 134–144)
eGFR: 81 mL/min/{1.73_m2} (ref >59)

## 2023-03-03 LAB — LIPID PANEL
Chol/HDL Ratio: 3.9 ratio (ref 0.0–4.4)
Cholesterol, Total: 249 mg/dL — ABNORMAL HIGH (ref 100–199)
HDL: 64 mg/dL (ref >39)
LDL Chol Calc (NIH): 166 mg/dL — ABNORMAL HIGH (ref 0–99)
Triglycerides: 106 mg/dL (ref 0–149)
VLDL Cholesterol Cal: 19 mg/dL (ref 5–40)

## 2023-03-03 LAB — HEMOGLOBIN A1C
Est. average glucose Bld gHb Est-mCnc: 140 mg/dL
Hgb A1c MFr Bld: 6.5 % — ABNORMAL HIGH (ref 4.8–5.6)

## 2023-03-06 ENCOUNTER — Encounter: Payer: Self-pay | Admitting: Family Medicine

## 2023-03-06 ENCOUNTER — Ambulatory Visit: Payer: 59 | Admitting: Family Medicine

## 2023-03-06 ENCOUNTER — Encounter (INDEPENDENT_AMBULATORY_CARE_PROVIDER_SITE_OTHER): Payer: Self-pay | Admitting: *Deleted

## 2023-03-06 VITALS — BP 112/78 | HR 73 | Temp 97.7°F | Ht 61.5 in | Wt 167.0 lb

## 2023-03-06 DIAGNOSIS — R7401 Elevation of levels of liver transaminase levels: Secondary | ICD-10-CM | POA: Diagnosis not present

## 2023-03-06 DIAGNOSIS — K760 Fatty (change of) liver, not elsewhere classified: Secondary | ICD-10-CM | POA: Diagnosis not present

## 2023-03-06 DIAGNOSIS — E119 Type 2 diabetes mellitus without complications: Secondary | ICD-10-CM

## 2023-03-06 DIAGNOSIS — R748 Abnormal levels of other serum enzymes: Secondary | ICD-10-CM

## 2023-03-06 DIAGNOSIS — E785 Hyperlipidemia, unspecified: Secondary | ICD-10-CM

## 2023-03-06 DIAGNOSIS — Z7984 Long term (current) use of oral hypoglycemic drugs: Secondary | ICD-10-CM

## 2023-03-06 DIAGNOSIS — Z1159 Encounter for screening for other viral diseases: Secondary | ICD-10-CM

## 2023-03-06 DIAGNOSIS — I10 Essential (primary) hypertension: Secondary | ICD-10-CM

## 2023-03-06 MED ORDER — GABAPENTIN 100 MG PO CAPS: ORAL_CAPSULE | ORAL | 5 refills | Status: DC

## 2023-03-06 MED ORDER — ALPRAZOLAM 0.25 MG PO TABS: ORAL_TABLET | ORAL | 0 refills | Status: DC

## 2023-03-06 MED ORDER — METFORMIN HCL 500 MG PO TABS: ORAL_TABLET | ORAL | 1 refills | Status: DC

## 2023-03-06 MED ORDER — SUMATRIPTAN SUCCINATE 100 MG PO TABS: ORAL_TABLET | ORAL | 5 refills | Status: DC

## 2023-03-06 MED ORDER — POTASSIUM CHLORIDE ER 10 MEQ PO TBCR: EXTENDED_RELEASE_TABLET | ORAL | 1 refills | Status: DC

## 2023-03-06 MED ORDER — FLUTICASONE PROPIONATE HFA 110 MCG/ACT IN AERO
INHALATION_SPRAY | RESPIRATORY_TRACT | 12 refills | Status: DC
Start: 2023-03-06 — End: 2023-03-20

## 2023-03-06 MED ORDER — TOPIRAMATE 50 MG PO TABS: ORAL_TABLET | ORAL | 1 refills | Status: DC

## 2023-03-06 MED ORDER — FLUCONAZOLE 150 MG PO TABS: 150.0000 mg | ORAL_TABLET | Freq: Once | ORAL | 1 refills | Status: AC

## 2023-03-06 MED ORDER — BENZONATATE 100 MG PO CAPS: 100.0000 mg | ORAL_CAPSULE | Freq: Three times a day (TID) | ORAL | 0 refills | Status: DC | PRN

## 2023-03-06 MED ORDER — EZETIMIBE 10 MG PO TABS
10.0000 mg | ORAL_TABLET | Freq: Every day | ORAL | 3 refills | Status: DC
Start: 2023-03-06 — End: 2023-05-18

## 2023-03-06 NOTE — Addendum Note (Signed)
Addended by: Alm Bustard R on: 03/06/2023 02:22 PM   Modules accepted: Orders

## 2023-03-06 NOTE — Addendum Note (Signed)
Addended by: Alm Bustard R on: 03/06/2023 03:04 PM   Modules accepted: Orders

## 2023-03-06 NOTE — Progress Notes (Signed)
Subjective:    Patient ID: Alicia Owens, female    DOB: Nov 28, 1975, 47 y.o.   MRN: [id number]  HPI 6 month follow up  Recheck red flags labs  Replace Q var with flovent inhaler Would like to stop citalopram Past Medical History:  Diagnosis Date   Anemia    Anxiety    Chronic abdominal pain    Depression    Gastroparesis    GERD (gastroesophageal reflux disease)    Headache(784.0)    History of abdominal hysterectomy 09/03/2022   Ovaries removed as well.  04/10/2022-DrDonnie Coffin [place]   Hypertension    Hyperthyroidism 2011   IBS (irritable bowel syndrome)    Iron deficiency anemia, unspecified 10/05/2019   More than likely this is due to heavy cycles.  Patient has ongoing history of heavy cycles.  IFBOT - January 2021   Menorrhagia 10/05/2019   PONV (postoperative nausea and vomiting)    PP care - C/S 12/17 08/22/2011   Outpatient Encounter Medications as of 03/06/2023  Medication Sig Note   albuterol (VENTOLIN HFA) 108 (90 Base) MCG/ACT inhaler INHALE TWO PUFFS INTO THE LUNGS EVERY SIX HOURS AS NEEDED FOR WHEEZING OR SHORTNESS OF BREATH    busPIRone (BUSPAR) 15 MG tablet TAKE ONE TABLET BY MOUTH TWICE A DAY 02/06/2023: LF:11/22/2022 15 MG TABS (disp 60, 30d supply)   CRANBERRY PO Take by mouth.    cyclobenzaprine (FLEXERIL) 10 MG tablet TAKE ONE (1) TABLET BY MOUTH 3 TIMES DAILY AS NEEDED (BETWEEN MEALS AND AT BEDTIME) 02/06/2023: LF:10/17/2022 10 MG TABS (disp 30, 10d supply)   dicyclomine (BENTYL) 20 MG tablet TAKE ONE TABLET BY MOUTH THREE TIMES DAILY AS NEEDED OR AS DIRECTED 02/06/2023: LF:12/16/2022 20 MG TABS (disp 90, 30d supply)   doxycycline (VIBRAMYCIN) 100 MG capsule Take 1 capsule (100 mg total) by mouth 2 (two) times daily. One po bid x 7 days    ezetimibe (ZETIA) 10 MG tablet Take 1 tablet (10 mg total) by mouth daily.    fexofenadine (ALLEGRA) 180 MG tablet Take 180 mg by mouth daily.    fluconazole (DIFLUCAN) 150 MG tablet Take 1 tablet (150 mg total) by  mouth once for 1 dose.    fluticasone (FLOVENT HFA) 110 MCG/ACT inhaler 2 puff twice daily    furosemide (LASIX) 20 MG tablet Take one tablet po q am prn severe edema. Use sparingly    hydrochlorothiazide (HYDRODIURIL) 25 MG tablet TAKE ONE TABLET BY MOUTH EVERY MORNING    ibuprofen (ADVIL) 600 MG tablet Take 1 tablet (600 mg total) by mouth every 6 (six) hours as needed.    Lactobacillus-Inulin (CULTURELLE DIGESTIVE HEALTH) CAPS Take 1 capsule by mouth daily.    metFORMIN (GLUCOPHAGE) 500 MG tablet 1 q day    mometasone (NASONEX) 50 MCG/ACT nasal spray Place 2 sprays into the nose as needed.     montelukast (SINGULAIR) 10 MG tablet 1 qd 02/06/2023: LF:01/27/2023 10 MG TABS (disp 30, 30d supply)   Multiple Vitamins-Minerals (MULTIVITAMIN WITH MINERALS) tablet Take 1 tablet by mouth daily.    ondansetron (ZOFRAN) 8 MG tablet Take by mouth every 8 (eight) hours as needed for nausea or vomiting.    pantoprazole (PROTONIX) 40 MG tablet Take 1 tablet (40 mg total) by mouth daily. 02/06/2023: LF:01/27/2023 40 MG TBEC (disp 30, 30d supply)   SUPER B COMPLEX/C PO Take 1 tablet by mouth daily.     SV CRANBERRY PO Take 1 tablet by mouth daily.  TURMERIC PO Take 1 capsule by mouth daily.     VITAMIN D, CHOLECALCIFEROL, PO Take by mouth 3 (three) times a week.    [DISCONTINUED] ALPRAZolam (XANAX) 0.25 MG tablet TAKE ONE TABLET BY MOUTH TWICE DAILY AS NEEDED 02/06/2023: LF:10/27/2022 0.25 MG TABS (disp 30, 15d supply)   [DISCONTINUED] beclomethasone (QVAR REDIHALER) 80 MCG/ACT inhaler 2 puffs twice daily rinse mouth after use 02/06/2023: LF:12/01/2022 80 MCG/ACT AERB (disp 11, 30d supply)   [DISCONTINUED] benzonatate (TESSALON) 100 MG capsule Take 1 capsule (100 mg total) by mouth 3 (three) times daily as needed for cough.    [DISCONTINUED] citalopram (CELEXA) 10 MG tablet TAKE ONE TABLET (20MG  TOTAL) BY MOUTH DAILY 02/06/2023: LF:10/17/2022 20 MG TABS (disp 30, 30d supply)   [DISCONTINUED] gabapentin (NEURONTIN) 100  MG capsule 2 qhs 02/06/2023: LF:11/22/2022  100 MG CAPS (disp 60, 30d supply)     [DISCONTINUED] mupirocin nasal ointment (BACTROBAN) 2 % Place 1 application. into the nose 2 (two) times daily. Use one-half of tube in each nostril twice daily for five (5) days. After application, press sides of nose together and gently massage.    [DISCONTINUED] ondansetron (ZOFRAN-ODT) 4 MG disintegrating tablet 4mg  ODT q4 hours prn nausea/vomit    [DISCONTINUED] potassium chloride (KLOR-CON) 10 MEQ tablet TAKE ONE TABLET BY MOUTH ONCE A DAY AS NEEDED WITH HCTZ 02/06/2023: LF:11/17/2022 10 MEQ TBCR (disp 30, 30d supply)   [DISCONTINUED] potassium chloride SA (KLOR-CON M) 20 MEQ tablet Take 1 tablet (20 mEq total) by mouth daily.    [DISCONTINUED] SUMAtriptan (IMITREX) 100 MG tablet TAKE ONE TABLET BY MOUTH AT ONSET OF HEADACHE. MAY REPEAT ONE TIME IN TWO HOURS IF NO RELIEF. DO NOT EXCEED TWO TABLETS IN 24 HOURS. 02/06/2023: LF:10/17/2022 100 MG TABS (disp 9, 18d supply)   [DISCONTINUED] topiramate (TOPAMAX) 50 MG tablet 3 in the evening 02/06/2023: LF:01/27/2023 50 MG TABS (disp 90, 30d supply)   [DISCONTINUED] valACYclovir (VALTREX) 1000 MG tablet Take 1 tablet (1,000 mg total) by mouth 3 (three) times daily. 02/06/2023: LF:01/30/2023 1000 MG TABS (disp 21, 7d supply)   ALPRAZolam (XANAX) 0.25 MG tablet TAKE ONE TABLET BY MOUTH TWICE DAILY AS NEEDED    benzonatate (TESSALON) 100 MG capsule Take 1 capsule (100 mg total) by mouth 3 (three) times daily as needed for cough.    gabapentin (NEURONTIN) 100 MG capsule 2 qhs    potassium chloride (KLOR-CON) 10 MEQ tablet TAKE ONE TABLET BY MOUTH ONCE A DAY AS NEEDED WITH HCTZ    SUMAtriptan (IMITREX) 100 MG tablet May repeat in 2 hours if headache persists or recurs.    topiramate (TOPAMAX) 50 MG tablet 3 in the evening    [DISCONTINUED] clobetasol ointment (TEMOVATE) 0.05 % Apply 1 Application topically 2 (two) times daily.    [DISCONTINUED] ondansetron (ZOFRAN) 8 MG tablet TAKE ONE  TABLET BY MOUTH THREE TO FOUR TIMES A DAY AS NEEDED 02/06/2023: LF:12/16/2022 8 MG TABS (disp 30, 8d supply)    No facility-administered encounter medications on file as of 03/06/2023.   Fatty liver  Diabetes mellitus without complication (HCC)  Elevated transaminase level Patient did have recent lab work that shows elevated liver enzymes AST 64 ALT 73, A1c 6.5, urine ACR good, GFR electrolytes look good, cholesterol LDL 166 HDL 64  Patient states she is trying to eat healthy she is frustrated by inability to lose weight She denies being depressed She states she tries to eat healthy in the morning and at lunchtime and does not eat much at dinnertime  Minimizes snacks Drinks mainly water Does do some walking for exercise Patient denies abdominal pain Previous CAT scan did show fatty liver   Review of Systems     Objective:   Physical Exam  General-in no acute distress Eyes-no discharge Lungs-respiratory rate normal, CTA CV-no murmurs,RRR Extremities skin warm dry no edema Neuro grossly normal Behavior normal, alert Nurse present Abdominal exam soft no masses liver area is soft extremities no edema skin warm dry      Assessment & Plan:  1. Fatty liver CAT scan shows fatty liver We will do additional lab work Will also connect with gastroenterology Plus also patient due for colonoscopy as well  2. Diabetes mellitus without complication (HCC) Portion control, healthy diet, regular physical activity, minimizing starches and sugars in the diet, metformin 500 mg side effects discussed call us if any problems GLP-1's would be a good choice if metformin not tolerated or if not under good control  3. Elevated transaminase level Additional lab work CAT scan showed fatty liver Will connect with gastroenterology to see if they recommend ultrasound as well  4. Hyperlipidemia, unspecified hyperlipidemia type Healthy diet, hold off on statins due to elevated liver enzymes, start  Zetia 10 mg daily   5. Primary hypertension Blood pressure good control.  Kidney function good. Given that she needs a colonoscopy and has this issue we will go ahead with referral Follow-up in approximately 3 to 4 months for additional lab work

## 2023-03-08 LAB — HEPATITIS B SURFACE ANTIGEN: Hepatitis B Surface Ag: NEGATIVE

## 2023-03-08 LAB — IRON,TIBC AND FERRITIN PANEL
Ferritin: 110 ng/mL (ref 15–150)
Iron Saturation: 27 % (ref 15–55)
Iron: 71 ug/dL (ref 27–159)
Total Iron Binding Capacity: 264 ug/dL (ref 250–450)
UIBC: 193 ug/dL (ref 131–425)

## 2023-03-08 LAB — HEPATITIS C ANTIBODY: Hep C Virus Ab: NONREACTIVE

## 2023-03-08 LAB — CERULOPLASMIN: Ceruloplasmin: 27.2 mg/dL (ref 19.0–39.0)

## 2023-03-13 NOTE — Telephone Encounter (Signed)
Staff-please order liver profile she will do this in approximately 8 weeks You may forward the rest of the message to her  Hi Alicia Owens  It would be okay to utilize the gabapentin when it flares up  It is possible estradiol could be playing a role.  It would be reasonable to stop the estradiol and recheck liver enzymes in 8 weeks.  We should see adequate response off of the medicine within that span of time.  Our staff will order the liver enzymes Your referral has been sent to gastroenterology they should reach out to you to help set up your appointment at some point.  Feel free to proceed forward with stopping estradiol for the next couple months and then rechecking your liver enzymes-thanks-Dr. Lorin Picket

## 2023-03-15 ENCOUNTER — Encounter: Payer: Self-pay | Admitting: *Deleted

## 2023-03-17 ENCOUNTER — Other Ambulatory Visit: Payer: Self-pay | Admitting: Family Medicine

## 2023-03-19 ENCOUNTER — Encounter (INDEPENDENT_AMBULATORY_CARE_PROVIDER_SITE_OTHER): Payer: Self-pay | Admitting: *Deleted

## 2023-03-20 ENCOUNTER — Other Ambulatory Visit: Payer: Self-pay | Admitting: Family Medicine

## 2023-03-20 MED ORDER — PULMICORT FLEXHALER 90 MCG/ACT IN AEPB: 1.0000 | INHALATION_SPRAY | Freq: Two times a day (BID) | RESPIRATORY_TRACT | 5 refills | Status: DC

## 2023-03-20 NOTE — Telephone Encounter (Signed)
According to her insurance that was what was covered.  It should work as well as Qvar.  Certainly if for some odd reason insurance now changes their mind that Qvar is the preferred medicine we can make to switch to that thanks otherwise stick with Pulmicort

## 2023-03-22 ENCOUNTER — Encounter: Payer: 59 | Attending: Family Medicine | Admitting: Nutrition

## 2023-03-22 DIAGNOSIS — E118 Type 2 diabetes mellitus with unspecified complications: Secondary | ICD-10-CM | POA: Insufficient documentation

## 2023-03-22 DIAGNOSIS — K760 Fatty (change of) liver, not elsewhere classified: Secondary | ICD-10-CM | POA: Insufficient documentation

## 2023-03-27 NOTE — Telephone Encounter (Signed)
Nurses I do not recommend a scan at this point If her headache gets worse I would recommend to be seen urgent care We could work her in next week but unfortunately due to lack of provider availability we are doing the best we can I would continue to do Tylenol currently I doubt it is due to stopping the estrogens She may have an open slot for next week

## 2023-04-05 ENCOUNTER — Ambulatory Visit: Payer: 59 | Admitting: Family Medicine

## 2023-04-10 ENCOUNTER — Other Ambulatory Visit: Payer: Self-pay | Admitting: Family Medicine

## 2023-04-24 ENCOUNTER — Ambulatory Visit (INDEPENDENT_AMBULATORY_CARE_PROVIDER_SITE_OTHER): Payer: No Typology Code available for payment source | Admitting: Gastroenterology

## 2023-04-24 ENCOUNTER — Encounter (INDEPENDENT_AMBULATORY_CARE_PROVIDER_SITE_OTHER): Payer: Self-pay | Admitting: Gastroenterology

## 2023-04-24 VITALS — BP 119/78 | HR 81 | Temp 97.7°F | Ht 61.5 in | Wt 165.0 lb

## 2023-04-24 DIAGNOSIS — R748 Abnormal levels of other serum enzymes: Secondary | ICD-10-CM

## 2023-04-24 DIAGNOSIS — R1013 Epigastric pain: Secondary | ICD-10-CM | POA: Insufficient documentation

## 2023-04-24 DIAGNOSIS — K219 Gastro-esophageal reflux disease without esophagitis: Secondary | ICD-10-CM | POA: Diagnosis not present

## 2023-04-24 DIAGNOSIS — R1310 Dysphagia, unspecified: Secondary | ICD-10-CM | POA: Insufficient documentation

## 2023-04-24 DIAGNOSIS — Z1211 Encounter for screening for malignant neoplasm of colon: Secondary | ICD-10-CM | POA: Diagnosis not present

## 2023-04-24 MED ORDER — PANTOPRAZOLE SODIUM 40 MG PO TBEC: 40.0000 mg | DELAYED_RELEASE_TABLET | Freq: Two times a day (BID) | ORAL | 2 refills | Status: DC

## 2023-04-24 MED ORDER — PEG 3350-KCL-NA BICARB-NACL 420 G PO SOLR: 4000.0000 mL | Freq: Once | ORAL | 0 refills | Status: AC

## 2023-04-24 NOTE — Patient Instructions (Signed)
Please increase pantoprazole 40mg  to twice daily, take one in the morning prior to breakfast and the other in the evenings prior to dinner Avoid greasy, spicy, fried, citrus foods, and be mindful that caffeine, carbonated drinks, chocolate and alcohol can increase reflux symptoms Stay upright 2-3 hours after eating, prior to lying down and avoid eating late in the evenings. We will get you scheduled for upper endoscopy for further evaluation of swallowing and abdominal pain as well as screening colonoscopy given you are over  age 26 I will have Dr. Gerda Diss send me updated liver function tests after they are drawn next month, if still elevated will proceed with special ultrasound of the liver and further labs  Follow up 4 months  It was a pleasure to see you today. I want to create trusting relationships with patients and provide genuine, compassionate, and quality care. I truly value your feedback! please be on the lookout for a survey regarding your visit with me today. I appreciate your input about our visit and your time in completing this!    Brennon Otterness L. Jeanmarie Hubert, MSN, APRN, AGNP-C Adult-Gerontology Nurse Practitioner [facility] at [facility]

## 2023-04-24 NOTE — Progress Notes (Addendum)
Referring Provider: Babs Sciara, MD Primary Care Physician:  Babs Sciara, MD Primary GI Physician: new (Dr. Levon Hedger)  Chief Complaint  Patient presents with   Elevated Hepatic Enzymes    Referred for fatty liver and elevated liver enzymes. Having some pain in right upper side off and on for years. Pt states her liver enzymes started going up after starting estradiol. Gyn told pt to stop estradiol about 6 weeks and recheck liver in 8 weeks.    HPI:   Alicia Owens is a 47 y.o. female with past medical history of anemia, anxiety, depression, gastroparesis, GERD, HTN, hyperthyroidism, IBS, IDA   Patient presenting today for elevated LFTs, epigastric/RUQ pain, GERD and dysphagia   LFTs at the end of June with AST 64, ALT 73, mildly elevated since December 2023, initially AST 44, ALT 54 Platelet count 390k in June, other LFTs WNL  Hep BsAg, Hep C Ab, Iron studies all normal, ceruloplasmin 27.2  CT [facility] with contrast in June with fatty liver   Present: Patient states that she had a hysterectomy in August 2023, was started on estradiol thereafter which is when LFTs began to rise. She talked to her OB GYN who thought that estradiol could be elevating her LFTs. She stopped this in July and is supposed to have them rechecked in a few weeks. She denies any other new medications, no herbal supplements, energy drinks or teas.   She has some epigastric pain that radiates to the RUQ. She notes pain is not there all the time. Has not been able to identify any precipitating factors. Can take tums sometimes which does help, ginger ale also seems to help some. No nausea or vomiting. Denies rectal bleeding or melena. No early satiety. She notes some dysphagia at times as well. Taking pantoprazole 40mg    NSAID use: only on occasion  Social hx: etoh very occasional, no tobacco  Fam hx: no CRC, pancreatic cancer, father has history of fatty liver   Last Colonoscopy: 2010 normal  Last Endoscopy:  2011 Mild gastritis     2) Otherwise normal examination     s/p small bowl biopsies and CLO test     nothing to account fot the abdominal pain, which I think, is     functional  Recommendations:    Past Medical History:  Diagnosis Date   Anemia    Anxiety    Chronic abdominal pain    Depression    Gastroparesis    GERD (gastroesophageal reflux disease)    Headache(784.0)    History of abdominal hysterectomy 09/03/2022   Ovaries removed as well.  04/10/2022-DrDonnie Coffin [place]   Hypertension    Hyperthyroidism 2011   IBS (irritable bowel syndrome)    Iron deficiency anemia, unspecified 10/05/2019   More than likely this is due to heavy cycles.  Patient has ongoing history of heavy cycles.  IFBOT - January 2021   Menorrhagia 10/05/2019   PONV (postoperative nausea and vomiting)    PP care - C/S 12/17 08/22/2011    Past Surgical History:  Procedure Laterality Date   APPENDECTOMY     BASAL CELL CARCINOMA EXCISION  02/2006   face   BREAST SURGERY     Breast reduction   CESAREAN SECTION  2007 and 2012   x2   CHOLECYSTECTOMY  12/15/2011   Procedure: LAPAROSCOPIC CHOLECYSTECTOMY;  Surgeon: Dalia Heading, MD;  Location: [facility];  Service: General;  Laterality: N/A;   COLONOSCOPY  ESOPHAGOGASTRODUODENOSCOPY     TUBAL LIGATION     with last c-section   WISDOM TOOTH EXTRACTION  08/2003    Current Outpatient Medications  Medication Sig Dispense Refill   albuterol (VENTOLIN HFA) 108 (90 Base) MCG/ACT inhaler INHALE TWO PUFFS INTO THE LUNGS EVERY SIX HOURS AS NEEDED FOR WHEEZING OR SHORTNESS OF BREATH 18 g 5   ALPRAZolam (XANAX) 0.25 MG tablet TAKE ONE TABLET BY MOUTH TWICE DAILY AS NEEDED 30 tablet 0   benzonatate (TESSALON) 100 MG capsule Take 1 capsule (100 mg total) by mouth 3 (three) times daily as needed for cough. 21 capsule 0   busPIRone (BUSPAR) 15 MG tablet TAKE ONE TABLET BY MOUTH TWICE A DAY 180 tablet 1   CRANBERRY PO Take by mouth.      cyclobenzaprine (FLEXERIL) 10 MG tablet TAKE ONE (1) TABLET BY MOUTH 3 TIMES DAILY AS NEEDED (BETWEEN MEALS AND AT BEDTIME) 30 tablet 5   dicyclomine (BENTYL) 20 MG tablet TAKE ONE TABLET BY MOUTH THREE TIMES DAILY AS NEEDED OR AS DIRECTED 90 tablet 5   famotidine (PEPCID) 40 MG tablet Take 40 mg by mouth daily.     fexofenadine (ALLEGRA) 180 MG tablet Take 180 mg by mouth daily.     furosemide (LASIX) 20 MG tablet Take one tablet po q am prn severe edema. Use sparingly 30 tablet 0   gabapentin (NEURONTIN) 100 MG capsule 2 qhs 60 capsule 5   hydrochlorothiazide (HYDRODIURIL) 25 MG tablet TAKE ONE TABLET BY MOUTH EVERY MORNING 90 tablet 0   Lactobacillus-Inulin (CULTURELLE DIGESTIVE HEALTH) CAPS Take 1 capsule by mouth daily.     montelukast (SINGULAIR) 10 MG tablet 1 qd 90 tablet 1   Multiple Vitamins-Minerals (MULTIVITAMIN WITH MINERALS) tablet Take 1 tablet by mouth daily.     ondansetron (ZOFRAN) 8 MG tablet Take by mouth every 8 (eight) hours as needed for nausea or vomiting.     potassium chloride (KLOR-CON) 10 MEQ tablet TAKE ONE TABLET BY MOUTH ONCE A DAY AS NEEDED WITH HCTZ 90 tablet 1   SUMAtriptan (IMITREX) 100 MG tablet May repeat in 2 hours if headache persists or recurs. 9 tablet 5   SUPER B COMPLEX/C PO Take 1 tablet by mouth daily.      SV CRANBERRY PO Take 1 tablet by mouth daily.      topiramate (TOPAMAX) 50 MG tablet 3 in the evening 270 tablet 1   Triamcinolone Acetonide (NASACORT AQ NA) Place into the nose.     TURMERIC PO Take 1 capsule by mouth daily.      VITAMIN D, CHOLECALCIFEROL, PO Take by mouth 3 (three) times a week.     Budesonide (PULMICORT FLEXHALER) 90 MCG/ACT inhaler Inhale 1 puff into the lungs 2 (two) times daily. (Patient not taking: Reported on 04/24/2023) 1 each 5   ezetimibe (ZETIA) 10 MG tablet Take 1 tablet (10 mg total) by mouth daily. (Patient not taking: Reported on 04/24/2023) 90 tablet 3   metFORMIN (GLUCOPHAGE) 500 MG tablet 1 q day (Patient not  taking: Reported on 04/24/2023) 90 tablet 1   No current facility-administered medications for this visit.    Allergies as of 04/24/2023 - Review Complete 04/24/2023  Allergen Reaction Noted   Ciprofloxacin  04/02/2018   Codeine  05/03/2009   Flagyl [metronidazole]  04/02/2018   Promethazine hcl     Reglan [metoclopramide]  11/18/2012   Sulfonamide derivatives  05/03/2009    Family History  Problem Relation Age of Onset  Healthy Daughter    Healthy Son    Hypertension Mother    Hypertension Father    Hyperlipidemia Father    Hyperlipidemia Other    Hypertension Other    Anesthesia problems Neg Hx    Hypotension Neg Hx    Malignant hyperthermia Neg Hx    Pseudochol deficiency Neg Hx     Social History   Socioeconomic History   Marital status: Married    Spouse name: Not on file   Number of children: Not on file   Years of education: Not on file   Highest education level: Not on file  Occupational History   Not on file  Tobacco Use   Smoking status: Never   Smokeless tobacco: Never  Vaping Use   Vaping status: Never Used  Substance and Sexual Activity   Alcohol use: No   Drug use: No   Sexual activity: Yes    Birth control/protection: Surgical  Other Topics Concern   Not on file  Social History Narrative   Not on file   Social Determinants of Health   Financial Resource Strain: Not on file  Food Insecurity: Not on file  Transportation Needs: Not on file  Physical Activity: Not on file  Stress: Not on file  Social Connections: Not on file   Review of systems General: negative for malaise, night sweats, fever, chills, weight loss Neck: Negative for lumps, goiter, pain and significant neck swelling Resp: Negative for cough, wheezing, dyspnea at rest CV: Negative for chest pain, leg swelling, palpitations, orthopnea GI: denies melena, hematochezia, nausea, vomiting, diarrhea, constipation, odyonophagia, early satiety or unintentional weight loss.  +epigastric pain +dysphagia  MSK: Negative for joint pain or swelling, back pain, and muscle pain. Derm: Negative for itching or rash Psych: Denies depression, anxiety, memory loss, confusion. No homicidal or suicidal ideation.  Heme: Negative for prolonged bleeding, bruising easily, and swollen nodes. Endocrine: Negative for cold or heat intolerance, polyuria, polydipsia and goiter. Neuro: negative for tremor, gait imbalance, syncope and seizures. The remainder of the review of systems is noncontributory.  Physical Exam: BP 119/78 (BP Location: Left Arm, Patient Position: Sitting, Cuff Size: Normal)   Pulse 81   Temp 97.7 F (36.5 C) (Oral)   Ht 5' 1.5" (1.562 m)   Wt 165 lb (74.8 kg)   LMP 07/29/2021   BMI 30.67 kg/m  General:   Alert and oriented. No distress noted. Pleasant and cooperative.  Head:  Normocephalic and atraumatic. Eyes:  Conjuctiva clear without scleral icterus. Mouth:  Oral mucosa pink and moist. Good dentition. No lesions. Heart: Normal rate and rhythm, s1 and s2 heart sounds present.  Lungs: Clear lung sounds in all lobes. Respirations equal and unlabored. Abdomen:  +BS, soft, non-tender and non-distended. No rebound or guarding. No HSM or masses noted. Derm: No palmar erythema or jaundice Msk:  Symmetrical without gross deformities. Normal posture. Extremities:  Without edema. Neurologic:  Alert and  oriented x4 Psych:  Alert and cooperative. Normal mood and affect.  Invalid input(s): "6 MONTHS"   ASSESSMENT: Alicia Owens is a 47 y.o. female presenting today for elevated LFTs, also with epigastric/RUQ pain and dysphagia.   Elevated LFTs: mild aminotransferases elevation in December, starting after initiation of estradiol in August, no other med changes, no frequent ETOH, herbal supplements, energy drinks or teas. Recent CT with fatty liver. She stopped estradiol in July, plans with PCP to recheck LFTs again next month. While there is not much data  indicating estradiol specifically  raises LFTs, given onset shortly after initiation, this certainly could have contributed. She had negative Hep B and C testing, iron studies and ceruloplasmin WNL. Plt count is normal. Her NAFLD score is -4.23 ( indicating possible F0-F2, though drug induced elevation may be playing a role). would recommend awaiting repeat LFTs in September, if still elevated, will proceed with further serologies and [place] elastography   Epigastric/RUQ pain/GERD/Dysphagia: GERD, improved since switching to pantoprazole 40 mg once daily from omeprazole, though still with some breakthrough symptoms.  She has some dysphagia as well.  She has some intermittent epigastric to right upper quadrant pain that improves with Tums.  History of gastritis on previous EGD in the past.  No frequent NSAID use.  Recommend proceeding with upper endoscopy for further evaluation as I cannot rule out PUD, gastritis, duodenitis in regards to her dysphagia cannot rule out esophageal ring, web, stricture, stenosis.  Need for screening colonoscopy: Last colonoscopy in 2010 was normal.  She has no family history of colorectal cancer that she is aware of.  She is of average risk.  She is not sure that her insurance will cover colonoscopy for screening until age 82 however we will attempt to get this covered by her insurance and scheduled as she should ideally have a screening at this time given she is over the age of 4.  Indications, risks and benefits of procedure discussed in detail with patient. Patient verbalized understanding and is in agreement to proceed with EGD +/- dilation and colonoscopy.    PLAN:  Increase protonix 40mg  to BID  2. Schedule EGD +/- dilation and colonoscopy -ASA II  3. Follow for repeat LFTs, further serologies (ANA, AMA, ASMA, A1A)/elastography if still elevated 4. Reflux precautions  All questions were answered, patient verbalized understanding and is in agreement with plan as outlined  above.   Follow Up: 3-4 months   Bayla Mcgovern L. Jeanmarie Hubert, MSN, APRN, AGNP-C Adult-Gerontology Nurse Practitioner [facility]  I have reviewed the note and agree with the APP's assessment as described in this progress note  Katrinka Blazing, MD Gastroenterology and Hepatology [facility]

## 2023-05-04 ENCOUNTER — Telehealth (INDEPENDENT_AMBULATORY_CARE_PROVIDER_SITE_OTHER): Payer: Self-pay | Admitting: *Deleted

## 2023-05-04 NOTE — Telephone Encounter (Signed)
Pt called with concerns about insurance covering colonoscopy. States she thought her insurance covered preventive colonscopy starting age 47 instead of 36. Spoke to tanya and she said her plan did not need auth for procedure. I told pt that and also told her she should call her insurance to see what age they cover preventive and what her portion of the procedure would be as we do not know that information.

## 2023-05-12 ENCOUNTER — Emergency Department (HOSPITAL_COMMUNITY)
Admission: EM | Admit: 2023-05-12 | Discharge: 2023-05-12 | Disposition: A | Discharge status: Home / Self Care | Payer: No Typology Code available for payment source | Attending: Emergency Medicine | Admitting: Emergency Medicine

## 2023-05-12 ENCOUNTER — Encounter (HOSPITAL_COMMUNITY): Payer: Self-pay | Admitting: Emergency Medicine

## 2023-05-12 ENCOUNTER — Other Ambulatory Visit: Payer: Self-pay

## 2023-05-12 ENCOUNTER — Emergency Department (HOSPITAL_COMMUNITY): Payer: No Typology Code available for payment source

## 2023-05-12 DIAGNOSIS — K5732 Diverticulitis of large intestine without perforation or abscess without bleeding: Secondary | ICD-10-CM | POA: Insufficient documentation

## 2023-05-12 DIAGNOSIS — K5792 Diverticulitis of intestine, part unspecified, without perforation or abscess without bleeding: Secondary | ICD-10-CM

## 2023-05-12 DIAGNOSIS — R1032 Left lower quadrant pain: Secondary | ICD-10-CM | POA: Diagnosis present

## 2023-05-12 LAB — URINALYSIS, ROUTINE W REFLEX MICROSCOPIC
Bilirubin Urine: NEGATIVE
Glucose, UA: NEGATIVE mg/dL
Hgb urine dipstick: NEGATIVE
Ketones, ur: NEGATIVE mg/dL
Leukocytes,Ua: NEGATIVE
Nitrite: NEGATIVE
Protein, ur: NEGATIVE mg/dL
Specific Gravity, Urine: 1.004 — ABNORMAL LOW (ref 1.005–1.030)
pH: 7 (ref 5.0–8.0)

## 2023-05-12 MED ORDER — OXYCODONE-ACETAMINOPHEN 5-325 MG PO TABS
1.0000 | ORAL_TABLET | Freq: Once | ORAL | Status: AC
  Administered 2023-05-12: 1 via ORAL
  Filled 2023-05-12: qty 1

## 2023-05-12 MED ORDER — OXYCODONE-ACETAMINOPHEN 5-325 MG PO TABS
1.0000 | ORAL_TABLET | Freq: Two times a day (BID) | ORAL | 0 refills | Status: DC | PRN
Start: 2023-05-12 — End: 2023-08-22

## 2023-05-12 MED ORDER — AMOXICILLIN-POT CLAVULANATE 875-125 MG PO TABS: 1.0000 | ORAL_TABLET | Freq: Two times a day (BID) | ORAL | 0 refills | Status: DC

## 2023-05-12 MED ORDER — ACETAMINOPHEN 500 MG PO TABS: 500.0000 mg | ORAL_TABLET | ORAL | 0 refills | Status: DC | PRN

## 2023-05-12 NOTE — ED Provider Notes (Signed)
[place] EMERGENCY DEPARTMENT AT [facility] Provider Note   CSN: [id number] Arrival date & time: 05/12/23  6578     History  Chief Complaint  Patient presents with   Flank Pain    Alicia Owens is a 47 y.o. female.  HPI    47 year old female comes in with chief complaint of groin pain, left-sided abdominal pain. Patient has been having pain over the left side of the abdomen for the last week.  Over time the pain has radiated to the abdomen and towards the groin.  She has history of diverticulitis, total abdominal hysterectomy with BSO.  Patient indicates that the pain is not consistent with her previous diverticulitis.  Pain is constant.  Pain normally has not radiated towards her groin.  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  Review of system is negative for UTI-like symptoms.  Home Medications Prior to Admission medications   Medication Sig Start Date End Date Taking? Authorizing Provider  acetaminophen (TYLENOL) 500 MG tablet Take 1 tablet (500 mg total) by mouth every 4 (four) hours as needed for moderate pain or fever. 05/12/23  Yes Derwood Kaplan, MD  amoxicillin-clavulanate (AUGMENTIN) 875-125 MG tablet Take 1 tablet by mouth every 12 (twelve) hours. 05/12/23  Yes Derwood Kaplan, MD  oxyCODONE-acetaminophen (PERCOCET/ROXICET) 5-325 MG tablet Take 1 tablet by mouth every 12 (twelve) hours as needed for severe pain. 05/12/23  Yes Jaxden Blyden, MD  albuterol (VENTOLIN HFA) 108 (90 Base) MCG/ACT inhaler INHALE TWO PUFFS INTO THE LUNGS EVERY SIX HOURS AS NEEDED FOR WHEEZING OR SHORTNESS OF BREATH 02/16/23   Babs Sciara, MD  ALPRAZolam Prudy Feeler) 0.25 MG tablet TAKE ONE TABLET BY MOUTH TWICE DAILY AS NEEDED 03/06/23   Babs Sciara, MD  benzonatate (TESSALON) 100 MG capsule Take 1 capsule (100 mg total) by mouth 3 (three) times daily as needed for cough. 03/06/23   Babs Sciara, MD  Budesonide (PULMICORT FLEXHALER) 90 MCG/ACT inhaler Inhale 1 puff  into the lungs 2 (two) times daily. Patient not taking: Reported on 04/24/2023 03/20/23   Babs Sciara, MD  busPIRone (BUSPAR) 15 MG tablet TAKE ONE TABLET BY MOUTH TWICE A DAY 04/13/23   Babs Sciara, MD  CRANBERRY PO Take by mouth.    [provider]  cyclobenzaprine (FLEXERIL) 10 MG tablet TAKE ONE (1) TABLET BY MOUTH 3 TIMES DAILY AS NEEDED (BETWEEN MEALS AND AT BEDTIME) 05/18/22   Luking, Jonna Coup, MD  dicyclomine (BENTYL) 20 MG tablet TAKE ONE TABLET BY MOUTH THREE TIMES DAILY AS NEEDED OR AS DIRECTED 12/05/22   Babs Sciara, MD  ezetimibe (ZETIA) 10 MG tablet Take 1 tablet (10 mg total) by mouth daily. Patient not taking: Reported on 04/24/2023 03/06/23   Babs Sciara, MD  famotidine (PEPCID) 40 MG tablet Take 40 mg by mouth daily.    [provider]  fexofenadine (ALLEGRA) 180 MG tablet Take 180 mg by mouth daily.    [provider]  furosemide (LASIX) 20 MG tablet Take one tablet po q am prn severe edema. Use sparingly 10/06/21   Babs Sciara, MD  gabapentin (NEURONTIN) 100 MG capsule 2 qhs 03/06/23   Babs Sciara, MD  hydrochlorothiazide (HYDRODIURIL) 25 MG tablet TAKE ONE TABLET BY MOUTH EVERY MORNING 02/16/23   Luking, Jonna Coup, MD  Lactobacillus-Inulin (CULTURELLE DIGESTIVE HEALTH) CAPS Take 1 capsule by mouth daily.    [provider]  metFORMIN (GLUCOPHAGE) 500 MG tablet  1 q day Patient not taking: Reported on 04/24/2023 03/06/23   Babs Sciara, MD  montelukast (SINGULAIR) 10 MG tablet 1 qd 08/24/22   Babs Sciara, MD  Multiple Vitamins-Minerals (MULTIVITAMIN WITH MINERALS) tablet Take 1 tablet by mouth daily.    [provider]  ondansetron (ZOFRAN) 8 MG tablet Take by mouth every 8 (eight) hours as needed for nausea or vomiting.    [provider]  pantoprazole (PROTONIX) 40 MG tablet Take 1 tablet (40 mg total) by mouth 2 (two) times daily. 04/24/23   Carlan, Chelsea L, NP  potassium chloride (KLOR-CON) 10 MEQ tablet TAKE  ONE TABLET BY MOUTH ONCE A DAY AS NEEDED WITH HCTZ 03/06/23   Babs Sciara, MD  SUMAtriptan (IMITREX) 100 MG tablet May repeat in 2 hours if headache persists or recurs. 03/06/23   Babs Sciara, MD  SUPER B COMPLEX/C PO Take 1 tablet by mouth daily.     [provider]  SV CRANBERRY PO Take 1 tablet by mouth daily.     [provider]  topiramate (TOPAMAX) 50 MG tablet 3 in the evening 03/06/23   Luking, Jonna Coup, MD  Triamcinolone Acetonide (NASACORT AQ NA) Place into the nose.    [provider]  TURMERIC PO Take 1 capsule by mouth daily.     [provider]  VITAMIN D, CHOLECALCIFEROL, PO Take by mouth 3 (three) times a week.    [provider]      Allergies    Ciprofloxacin, Codeine, Flagyl [metronidazole], Promethazine hcl, Reglan [metoclopramide], and Sulfonamide derivatives    Review of Systems   Review of Systems  All other systems reviewed and are negative.   Physical Exam Updated Vital Signs BP 124/81   Pulse 84   Temp (!) 97.5 F (36.4 C) (Oral)   Resp 18   Ht 5' 1.5" (1.562 m)   Wt 73.5 kg   LMP 07/29/2021   SpO2 92%   BMI 30.11 kg/m  Physical Exam Vitals and nursing note reviewed.  Constitutional:      Appearance: She is well-developed.  HENT:     Head: Atraumatic.  Cardiovascular:     Rate and Rhythm: Normal rate.  Pulmonary:     Effort: Pulmonary effort is normal.  Abdominal:     General: There is distension.     Tenderness: There is no guarding or rebound.     Comments: Patient has tenderness in the suprapubic and left lower quadrant region.  No rebound or guarding.  Musculoskeletal:     Cervical back: Normal range of motion and neck supple.  Skin:    General: Skin is warm and dry.  Neurological:     Mental Status: She is alert and oriented to person, place, and time.     ED Results / Procedures / Treatments   Labs (all labs ordered are listed, but only abnormal results are displayed) Labs Reviewed   URINALYSIS, ROUTINE W REFLEX MICROSCOPIC - Abnormal; Notable for the following components:      Result Value   Specific Gravity, Urine 1.004 (*)    All other components within normal limits    EKG None  Radiology CT Renal Stone Study  Result Date: 05/12/2023 CLINICAL DATA:  Acute left flank pain. EXAM: CT ABDOMEN AND PELVIS WITHOUT CONTRAST TECHNIQUE: Multidetector CT imaging of the abdomen and pelvis was performed following the standard protocol without IV contrast. RADIATION DOSE REDUCTION: This exam was performed according to the departmental dose-optimization  program which includes automated exposure control, adjustment of the mA and/or kV according to patient size and/or use of iterative reconstruction technique. COMPARISON:  February 06, 2023. FINDINGS: Lower chest: No acute abnormality. Hepatobiliary: Hepatic steatosis. Status post cholecystectomy. No biliary dilatation. Pancreas: Unremarkable. No pancreatic ductal dilatation or surrounding inflammatory changes. Spleen: Normal in size without focal abnormality. Adrenals/Urinary Tract: Adrenal glands are unremarkable. Kidneys are normal, without renal calculi, focal lesion, or hydronephrosis. Bladder is unremarkable. Stomach/Bowel: Stomach is unremarkable. Status post appendectomy. No evidence of bowel obstruction. Diverticulitis of descending colon is noted without abscess formation. Vascular/Lymphatic: No significant vascular findings are present. No enlarged abdominal or pelvic lymph nodes. Reproductive: Status post hysterectomy. No adnexal masses. Other: No abdominal wall hernia or abnormality. No abdominopelvic ascites. Musculoskeletal: No acute or significant osseous findings. IMPRESSION: Diverticulitis of descending colon without abscess formation. Hepatic steatosis. Electronically Signed   By: Lupita Raider M.D.   On: 05/12/2023 08:30    Procedures Procedures    Medications Ordered in ED Medications  oxyCODONE-acetaminophen  (PERCOCET/ROXICET) 5-325 MG per tablet 1 tablet (1 tablet Oral Given 05/12/23 1610)    ED Course/ Medical Decision Making/ A&P                                 Medical Decision Making Amount and/or Complexity of Data Reviewed Labs: ordered. Radiology: ordered.  Risk OTC drugs. Prescription drug management.   This patient presents to the ED with chief complaint(s) of abdominal pain with pertinent past medical history of diverticulitis and surgical history of total abdominal hysterectomy with BSO.The complaint involves an extensive differential diagnosis and also carries with it a high risk of complications and morbidity.    The differential diagnosis includes : Acute diverticulitis, kidney stone, musculoskeletal pain.  The initial plan is to get UA, CT abdomen pelvis with contrast. Patient was already seen at the urgent care, they informed her she might have musculoskeletal pain.  Patient has been taking medications at home without relief and is concerned that there could be something else going on.  Given that her symptoms are not consistent with previous diverticulitis, she would prefer getting CT for clarity.   Additional history obtained: Records reviewed  previous CT scan from June, 2024 that from reticulosis  Independent labs interpretation:  The following labs were independently interpreted: UA is overall reassuring.  Independent visualization and interpretation of imaging: - I independently visualized the following imaging with scope of interpretation limited to determining acute life threatening conditions related to emergency care: CT abdomen pelvis, which revealed no evidence of perforation or complication.  Treatment and Reassessment: Patient reassessed.  Made aware that the CT scan shows diverticulitis.  Final Clinical Impression(s) / ED Diagnoses Final diagnoses:  Acute diverticulitis    Rx / DC Orders ED Discharge Orders          Ordered     amoxicillin-clavulanate (AUGMENTIN) 875-125 MG tablet  Every 12 hours        05/12/23 1012    oxyCODONE-acetaminophen (PERCOCET/ROXICET) 5-325 MG tablet  Every 12 hours PRN        05/12/23 1014    acetaminophen (TYLENOL) 500 MG tablet  Every 4 hours PRN        05/12/23 1014              Derwood Kaplan, MD 05/12/23 1016

## 2023-05-12 NOTE — Discharge Instructions (Signed)
You were seen in the ER for abdominal pain.  CT scan is negative for kidney stones, but it does show diverticulitis.  Take the medications that are prescribed.

## 2023-05-12 NOTE — ED Triage Notes (Signed)
Pt with L flank pain that radiates to groin. Denies any urinary symptoms. Was seen at urgent care and told it was a muscle strain and given Tramadol but "pharmacy was closed".

## 2023-05-13 ENCOUNTER — Encounter (INDEPENDENT_AMBULATORY_CARE_PROVIDER_SITE_OTHER): Payer: Self-pay

## 2023-05-14 NOTE — Telephone Encounter (Signed)
Reviewed CT. Uncomplicated diverticulitis of descending colon.  I would suggest she complete 7 days of Augmentin, which would be appropriate treatment. Switch to clear liquids for two days. Then go to low fiber diet while on antibiotics. Have her call in on Thursday with a progress report. If she is still having significant pain, we can consider prolonging antibiotic course.

## 2023-05-15 ENCOUNTER — Encounter (INDEPENDENT_AMBULATORY_CARE_PROVIDER_SITE_OTHER): Payer: Self-pay | Admitting: Gastroenterology

## 2023-05-15 ENCOUNTER — Encounter (INDEPENDENT_AMBULATORY_CARE_PROVIDER_SITE_OTHER): Payer: Self-pay

## 2023-05-15 ENCOUNTER — Other Ambulatory Visit (INDEPENDENT_AMBULATORY_CARE_PROVIDER_SITE_OTHER): Payer: Self-pay | Admitting: *Deleted

## 2023-05-18 ENCOUNTER — Encounter: Payer: Self-pay | Admitting: Nurse Practitioner

## 2023-05-18 ENCOUNTER — Other Ambulatory Visit (INDEPENDENT_AMBULATORY_CARE_PROVIDER_SITE_OTHER): Payer: Self-pay | Admitting: Gastroenterology

## 2023-05-18 ENCOUNTER — Ambulatory Visit: Payer: No Typology Code available for payment source | Admitting: Nurse Practitioner

## 2023-05-18 VITALS — BP 126/88 | HR 89 | Temp 98.6°F | Ht 61.5 in | Wt 158.0 lb

## 2023-05-18 DIAGNOSIS — G43719 Chronic migraine without aura, intractable, without status migrainosus: Secondary | ICD-10-CM | POA: Diagnosis not present

## 2023-05-18 DIAGNOSIS — G4440 Drug-induced headache, not elsewhere classified, not intractable: Secondary | ICD-10-CM | POA: Diagnosis not present

## 2023-05-18 DIAGNOSIS — E2839 Other primary ovarian failure: Secondary | ICD-10-CM

## 2023-05-18 DIAGNOSIS — K5732 Diverticulitis of large intestine without perforation or abscess without bleeding: Secondary | ICD-10-CM

## 2023-05-18 MED ORDER — ESTRADIOL 0.0375 MG/24HR TD PTTW: 1.0000 | MEDICATED_PATCH | TRANSDERMAL | 5 refills | Status: DC

## 2023-05-18 MED ORDER — AMOXICILLIN-POT CLAVULANATE 875-125 MG PO TABS
1.0000 | ORAL_TABLET | Freq: Two times a day (BID) | ORAL | 0 refills | Status: AC
Start: 2023-05-18 — End: 2023-05-23

## 2023-05-18 NOTE — Progress Notes (Unsigned)
Subjective:    Patient ID: Alicia Owens, female    DOB: 1975-12-12, 47 y.o.   MRN: [id number]  HPI Pt comes in today to discuss migraines, pt states these started flaring up 2 and half months ago. Migraine flareup began distinctly after stopping her oral estrogen supplement 2-1/2 months ago.  Having daily migraines.  Has had migraines since she was 47 years old but had been well-controlled for years with topiramate up until now.  Some nausea and vomiting.  Mild dizziness.  No change in her migraine symptomatology.  No aura.  No numbness or weakness of the face arms or legs.  No difficulty with speech or swallowing.  Had hysterectomy with BSO last August, started her oral estrogen at that time.  Estrogen was discontinued due to elevation in her liver enzymes.  Since she has been off her medication for over 2 months, is due to have a recheck.  No relief with OTC meds or Imitrex.  Review of Systems  Constitutional:  Positive for fatigue.  Respiratory:  Negative for cough, chest tightness and shortness of breath.   Cardiovascular:  Negative for chest pain.  Neurological:  Positive for dizziness and headaches. Negative for syncope, facial asymmetry, speech difficulty, weakness and numbness.      05/18/2023    9:07 AM  Depression screen PHQ 2/9  Decreased Interest 0  Down, Depressed, Hopeless 0  PHQ - 2 Score 0  Altered sleeping 1  Tired, decreased energy 2  Change in appetite 0  Feeling bad or failure about yourself  0  Trouble concentrating 1  Moving slowly or fidgety/restless 0  Suicidal thoughts 0  PHQ-9 Score 4  Difficult doing work/chores Not difficult at all      05/18/2023    9:08 AM 09/07/2022   11:46 AM 08/24/2022    9:33 AM 04/12/2021    3:05 PM  GAD 7 : Generalized Anxiety Score  Nervous, Anxious, on Edge 0 0 0 0  Control/stop worrying 0 0 0 0  Worry too much - different things 0 0 0 0  Trouble relaxing 0 0 0 0  Restless 0 0 0 0  Easily annoyed or irritable 0 0 0 0   Afraid - awful might happen 0 0 0 0  Total GAD 7 Score 0 0 0 0  Anxiety Difficulty Not difficult at all Not difficult at all Not difficult at all Not difficult at all         Objective:   Physical Exam NAD.  Alert, oriented.  Tearful during most of the visit and in obvious discomfort.  No facial asymmetry.  Speech clear.  TMs mild clear effusion, no erythema.  Pharynx clear.  Neck supple with mild soft anterior cervical adenopathy.  Lungs clear.  Heart regular rate rhythm.  Gait normal. Today's Vitals   05/18/23 0906  BP: 126/88  Pulse: 89  Temp: 98.6 F (37 C)  SpO2: 99%  Weight: 158 lb (71.7 kg)  Height: 5' 1.5" (1.562 m)   Body mass index is 29.37 kg/m.        Assessment & Plan:   Problem List Items Addressed This Visit       Cardiovascular and Mediastinum   Migraines - Primary     Other   Hypoestrogenism   Rebound headache   Meds ordered this encounter  Medications   estradiol (VIVELLE-DOT) 0.0375 MG/24HR    Sig: Place 1 patch onto the skin 2 (two) times a week.  Dispense:  8 patch    Refill:  5    Order Specific Question:   Supervising Provider    Answer:   Lilyan Punt A [[id number]]   Highly suspect rebound headache due to the chronicity of her migraines and frequent treatment.  Also migraines distinctly began after stopping her oral estrogen supplement.  Will switch to estrogen patch to avoid any liver issues.  Patient plans to get her lab work done for repeat LFT today. Further follow-up based on lab results. Return if symptoms worsen or fail to improve. Call or go to ED in the meantime if any new or worsening migraine symptoms.

## 2023-05-19 ENCOUNTER — Encounter: Payer: Self-pay | Admitting: Nurse Practitioner

## 2023-05-19 ENCOUNTER — Other Ambulatory Visit: Payer: Self-pay | Admitting: Nurse Practitioner

## 2023-05-19 DIAGNOSIS — G4440 Drug-induced headache, not elsewhere classified, not intractable: Secondary | ICD-10-CM | POA: Insufficient documentation

## 2023-05-19 MED ORDER — KETOROLAC TROMETHAMINE 10 MG PO TABS: 10.0000 mg | ORAL_TABLET | Freq: Four times a day (QID) | ORAL | 0 refills | Status: DC | PRN

## 2023-05-21 ENCOUNTER — Telehealth (INDEPENDENT_AMBULATORY_CARE_PROVIDER_SITE_OTHER): Payer: Self-pay | Admitting: Gastroenterology

## 2023-05-21 ENCOUNTER — Encounter (INDEPENDENT_AMBULATORY_CARE_PROVIDER_SITE_OTHER): Payer: Self-pay

## 2023-05-21 NOTE — Telephone Encounter (Signed)
Pt left voicemail asking for return call Returned call to pt. Pt wanted to make sure she could go back to work on Thursday. Advised pt that is correct. Pt also wants to have EGD/TCS done at same time (will need to be in December since having diverticulitis) will cancel procedure for 06/01/23. Also placed work note in letters so pt could print it off at home.

## 2023-05-30 ENCOUNTER — Other Ambulatory Visit: Payer: Self-pay | Admitting: Nurse Practitioner

## 2023-05-30 MED ORDER — UBRELVY 50 MG PO TABS: ORAL_TABLET | ORAL | 2 refills | Status: DC

## 2023-05-31 ENCOUNTER — Other Ambulatory Visit: Payer: Self-pay | Admitting: Family Medicine

## 2023-06-01 ENCOUNTER — Ambulatory Visit (HOSPITAL_COMMUNITY): Admit: 2023-06-01 | Payer: No Typology Code available for payment source | Admitting: Gastroenterology

## 2023-06-01 ENCOUNTER — Encounter (HOSPITAL_COMMUNITY): Payer: Self-pay

## 2023-06-01 SURGERY — ESOPHAGOGASTRODUODENOSCOPY (EGD) WITH PROPOFOL
Anesthesia: Monitor Anesthesia Care

## 2023-06-02 ENCOUNTER — Encounter: Payer: Self-pay | Admitting: Family Medicine

## 2023-06-02 ENCOUNTER — Other Ambulatory Visit: Payer: Self-pay | Admitting: Nurse Practitioner

## 2023-06-02 ENCOUNTER — Emergency Department (HOSPITAL_COMMUNITY): Payer: No Typology Code available for payment source

## 2023-06-02 ENCOUNTER — Other Ambulatory Visit: Payer: Self-pay

## 2023-06-02 ENCOUNTER — Emergency Department (HOSPITAL_COMMUNITY)
Admission: EM | Admit: 2023-06-02 | Discharge: 2023-06-02 | Disposition: A | Discharge status: Home / Self Care | Payer: No Typology Code available for payment source | Attending: Emergency Medicine | Admitting: Emergency Medicine

## 2023-06-02 ENCOUNTER — Encounter (INDEPENDENT_AMBULATORY_CARE_PROVIDER_SITE_OTHER): Payer: Self-pay

## 2023-06-02 DIAGNOSIS — R519 Headache, unspecified: Secondary | ICD-10-CM | POA: Diagnosis present

## 2023-06-02 DIAGNOSIS — J45909 Unspecified asthma, uncomplicated: Secondary | ICD-10-CM | POA: Diagnosis not present

## 2023-06-02 DIAGNOSIS — I10 Essential (primary) hypertension: Secondary | ICD-10-CM | POA: Insufficient documentation

## 2023-06-02 DIAGNOSIS — Z79899 Other long term (current) drug therapy: Secondary | ICD-10-CM | POA: Diagnosis not present

## 2023-06-02 DIAGNOSIS — E039 Hypothyroidism, unspecified: Secondary | ICD-10-CM | POA: Insufficient documentation

## 2023-06-02 LAB — MAGNESIUM: Magnesium: 2.1 mg/dL (ref 1.7–2.4)

## 2023-06-02 LAB — CBC
HCT: 41.7 % (ref 36.0–46.0)
Hemoglobin: 14.4 g/dL (ref 12.0–15.0)
MCH: 31 pg (ref 26.0–34.0)
MCHC: 34.5 g/dL (ref 30.0–36.0)
MCV: 89.7 fL (ref 80.0–100.0)
Platelets: 376 10*3/uL (ref 150–400)
RBC: 4.65 MIL/uL (ref 3.87–5.11)
RDW: 11.9 % (ref 11.5–15.5)
WBC: 6.6 10*3/uL (ref 4.0–10.5)
nRBC: 0 % (ref 0.0–0.2)

## 2023-06-02 LAB — BASIC METABOLIC PANEL
Anion gap: 12 (ref 5–15)
BUN: 9 mg/dL (ref 6–20)
CO2: 27 mmol/L (ref 22–32)
Calcium: 9.4 mg/dL (ref 8.9–10.3)
Chloride: 95 mmol/L — ABNORMAL LOW (ref 98–111)
Creatinine, Ser: 0.86 mg/dL (ref 0.44–1.00)
GFR, Estimated: >60 mL/min (ref >60)
Glucose, Bld: 127 mg/dL — ABNORMAL HIGH (ref 70–99)
Potassium: 2.8 mmol/L — ABNORMAL LOW (ref 3.5–5.1)
Sodium: 134 mmol/L — ABNORMAL LOW (ref 135–145)

## 2023-06-02 MED ORDER — DROPERIDOL 2.5 MG/ML IJ SOLN
1.2500 mg | Freq: Once | INTRAMUSCULAR | Status: AC
  Administered 2023-06-02: 1.25 mg via INTRAVENOUS
  Filled 2023-06-02: qty 2

## 2023-06-02 MED ORDER — DEXAMETHASONE SODIUM PHOSPHATE 10 MG/ML IJ SOLN
10.0000 mg | Freq: Once | INTRAMUSCULAR | Status: AC
  Administered 2023-06-02: 10 mg via INTRAVENOUS
  Filled 2023-06-02: qty 1

## 2023-06-02 MED ORDER — KETOROLAC TROMETHAMINE 15 MG/ML IJ SOLN
15.0000 mg | Freq: Once | INTRAMUSCULAR | Status: AC
  Administered 2023-06-02: 15 mg via INTRAVENOUS
  Filled 2023-06-02: qty 1

## 2023-06-02 MED ORDER — POTASSIUM CHLORIDE CRYS ER 20 MEQ PO TBCR: 20.0000 meq | EXTENDED_RELEASE_TABLET | Freq: Every day | ORAL | 0 refills | Status: DC

## 2023-06-02 MED ORDER — SODIUM CHLORIDE 0.9 % IV BOLUS
1000.0000 mL | Freq: Once | INTRAVENOUS | Status: AC
  Administered 2023-06-02: 1000 mL via INTRAVENOUS

## 2023-06-02 MED ORDER — POTASSIUM CHLORIDE CRYS ER 20 MEQ PO TBCR
40.0000 meq | EXTENDED_RELEASE_TABLET | Freq: Once | ORAL | Status: AC
  Administered 2023-06-02: 40 meq via ORAL
  Filled 2023-06-02: qty 2

## 2023-06-02 MED ORDER — LIDOCAINE 5 % EX PTCH: 1.0000 | MEDICATED_PATCH | CUTANEOUS | 0 refills | Status: DC

## 2023-06-02 MED ORDER — LIDOCAINE 5 % EX PTCH
1.0000 | MEDICATED_PATCH | CUTANEOUS | Status: DC
  Administered 2023-06-02: 1 via TRANSDERMAL
  Filled 2023-06-02: qty 1

## 2023-06-02 MED ORDER — ESTRADIOL 0.1 MG/24HR TD PTTW: 1.0000 | MEDICATED_PATCH | TRANSDERMAL | 2 refills | Status: AC

## 2023-06-02 MED ORDER — ONDANSETRON HCL 4 MG/2ML IJ SOLN
4.0000 mg | Freq: Once | INTRAMUSCULAR | Status: AC
  Administered 2023-06-02: 4 mg via INTRAVENOUS
  Filled 2023-06-02: qty 2

## 2023-06-02 NOTE — ED Provider Notes (Signed)
[place] [facility]ERGENCY DEPARTMENT AT [facility] Provider Note   CSN: [id number] Arrival date & time: 06/02/23  4403     History  Chief Complaint  Patient presents with   Migraine    Alicia Owens is a 47 y.o. female.  HPI   47 year old female presents emergency department with complaints of headache.  Patient with history of headache for the past several years.  Reports over the past 3 months, as of worsening headache from her baseline.  Patient states that over the past few days or so, headache has continued to worsen.  Has been followed up with primary care in the outpatient setting who has been trying [place].  Patient on topiramate baseline for migraine prevention.  States she has been taking medication as prescribed without improvement of symptoms.  Reports headache in the top of her head with radiation down her neck.  Denies any fever, neck stiffness/rigidity, weakness estimated of his upper extremities have slurred speech, facial droop, gait abnormality.  Does report some blurred vision bilaterally which she states she has had in the past with worse headaches.  States that 10+ years ago, followed with neurology due to headaches "this severe" but since then, headaches have been managed by primary care as they have been controlled with her oral medication.  Reports nauseous with emesis couple of days ago due to headache but has not been vomiting since then.  Past medical history significant for migraine on Ubrelvy, hypothyroidism, chronic abdominal pain, GERD, gastroparesis, IBS, hypertension, asthma  Home Medications Prior to Admission medications   Medication Sig Start Date End Date Taking? Authorizing Provider  lidocaine (LIDODERM) 5 % Place 1 patch onto the skin daily. Remove & Discard patch within 12 hours or as directed by MD 06/02/23  Yes Sherian Maroon A, PA  potassium chloride SA (KLOR-CON M) 20 MEQ tablet Take 1 tablet (20 mEq total) by mouth daily. 06/03/23  Yes  Sherian Maroon A, PA  acetaminophen (TYLENOL) 500 MG tablet Take 1 tablet (500 mg total) by mouth every 4 (four) hours as needed for moderate pain or fever. 05/12/23   Derwood Kaplan, MD  albuterol (VENTOLIN HFA) 108 (90 Base) MCG/ACT inhaler INHALE TWO PUFFS INTO THE LUNGS EVERY SIX HOURS AS NEEDED FOR WHEEZING OR SHORTNESS OF BREATH 02/16/23   Babs Sciara, MD  ALPRAZolam Prudy Feeler) 0.25 MG tablet TAKE ONE TABLET BY MOUTH TWICE DAILY AS NEEDED 03/06/23   Babs Sciara, MD  benzonatate (TESSALON) 100 MG capsule Take 1 capsule (100 mg total) by mouth 3 (three) times daily as needed for cough. 03/06/23   Babs Sciara, MD  busPIRone (BUSPAR) 15 MG tablet TAKE ONE TABLET BY MOUTH TWICE A DAY 04/13/23   Luking, Jonna Coup, MD  CRANBERRY PO Take by mouth.    [provider]  cyclobenzaprine (FLEXERIL) 10 MG tablet TAKE ONE (1) TABLET BY MOUTH 3 TIMES DAILY AS NEEDED (BETWEEN MEALS AND AT BEDTIME) 05/18/22   Luking, Jonna Coup, MD  dicyclomine (BENTYL) 20 MG tablet TAKE ONE TABLET BY MOUTH THREE TIMES DAILY AS NEEDED OR AS DIRECTED 12/05/22   Luking, Jonna Coup, MD  estradiol (VIVELLE-DOT) 0.0375 MG/24HR Place 1 patch onto the skin 2 (two) times a week. 05/21/23   Campbell Riches, NP  fexofenadine (ALLEGRA) 180 MG tablet Take 180 mg by mouth daily.    [provider]  gabapentin (NEURONTIN) 100 MG capsule 2 qhs 03/06/23   Babs Sciara, MD  hydrochlorothiazide (HYDRODIURIL) 25 MG  tablet TAKE ONE TABLET BY MOUTH EVERY MORNING 05/31/23   Babs Sciara, MD  ketorolac (TORADOL) 10 MG tablet Take 1 tablet (10 mg total) by mouth every 6 (six) hours as needed. Prn migraine 05/19/23   Campbell Riches, NP  Lactobacillus-Inulin (CULTURELLE DIGESTIVE HEALTH) CAPS Take 1 capsule by mouth daily.    [provider]  montelukast (SINGULAIR) 10 MG tablet TAKE ONE (1) TABLET BY MOUTH EVERY DAY 05/31/23   Babs Sciara, MD  Multiple Vitamins-Minerals (MULTIVITAMIN WITH MINERALS) tablet Take 1 tablet by  mouth daily.    [provider]  ondansetron (ZOFRAN) 8 MG tablet Take by mouth every 8 (eight) hours as needed for nausea or vomiting.    [provider]  oxyCODONE-acetaminophen (PERCOCET/ROXICET) 5-325 MG tablet Take 1 tablet by mouth every 12 (twelve) hours as needed for severe pain. 05/12/23   Derwood Kaplan, MD  pantoprazole (PROTONIX) 40 MG tablet Take 1 tablet (40 mg total) by mouth 2 (two) times daily. 04/24/23   Carlan, Chelsea L, NP  potassium chloride (KLOR-CON) 10 MEQ tablet TAKE ONE TABLET BY MOUTH ONCE A DAY AS NEEDED WITH HCTZ 03/06/23   Babs Sciara, MD  SUMAtriptan (IMITREX) 100 MG tablet May repeat in 2 hours if headache persists or recurs. 03/06/23   Babs Sciara, MD  SUPER B COMPLEX/C PO Take 1 tablet by mouth daily.     [provider]  SV CRANBERRY PO Take 1 tablet by mouth daily.     [provider]  topiramate (TOPAMAX) 50 MG tablet 3 in the evening 03/06/23   Luking, Jonna Coup, MD  Triamcinolone Acetonide (NASACORT AQ NA) Place into the nose.    [provider]  TURMERIC PO Take 1 capsule by mouth daily.     [provider]  Ubrogepant (UBRELVY) 50 MG TABS Take one tab po at onset of migraine; may repeat dose x 1 after 2 hours if needed 05/30/23   Campbell Riches, NP  VITAMIN D, CHOLECALCIFEROL, PO Take by mouth 3 (three) times a week.    [provider]      Allergies    Ciprofloxacin, Codeine, Flagyl [metronidazole], Promethazine hcl, Reglan [metoclopramide], and Sulfonamide derivatives    Review of Systems   Review of Systems  All other systems reviewed and are negative.   Physical Exam Updated Vital Signs BP 133/75 (BP Location: Left Arm)   Pulse 74   Temp 98 F (36.7 C)   Resp 18   LMP 07/29/2021   SpO2 99%  Physical Exam Vitals and nursing note reviewed.  Constitutional:      General: She is not in acute distress.    Appearance: She is well-developed.  HENT:     Head: Normocephalic and  atraumatic.  Eyes:     Conjunctiva/sclera: Conjunctivae normal.  Cardiovascular:     Rate and Rhythm: Normal rate and regular rhythm.     Heart sounds: No murmur heard. Pulmonary:     Effort: Pulmonary effort is normal. No respiratory distress.     Breath sounds: Normal breath sounds.  Abdominal:     Palpations: Abdomen is soft.     Tenderness: There is no abdominal tenderness.  Musculoskeletal:        General: No swelling.     Cervical back: Normal range of motion and neck supple. No rigidity.  Skin:    General: Skin is warm and dry.     Capillary Refill: Capillary refill takes less than  2 seconds.  Neurological:     Mental Status: She is alert.     Comments: Alert and oriented to self, place, time and event.   Speech is fluent, clear without dysarthria or dysphasia.   Strength 5/5 in upper/lower extremities   Sensation intact in upper/lower extremities   Normal gait.  Negative Romberg. No pronator drift.  Normal finger-to-nose and feet tapping.  CN I not tested  CN II not tested CN III, IV, VI PERRLA and EOMs intact bilaterally  CN V Intact sensation to sharp and light touch to the face  CN VII facial movements symmetric  CN VIII not tested  CN IX, X no uvula deviation, symmetric rise of soft palate  CN XI 5/5 SCM and trapezius strength bilaterally  CN XII Midline tongue protrusion, symmetric L/R movements     Psychiatric:        Mood and Affect: Mood normal.     ED Results / Procedures / Treatments   Labs (all labs ordered are listed, but only abnormal results are displayed) Labs Reviewed  BASIC METABOLIC PANEL - Abnormal; Notable for the following components:      Result Value   Sodium 134 (*)    Potassium 2.8 (*)    Chloride 95 (*)    Glucose, Bld 127 (*)    All other components within normal limits  CBC  MAGNESIUM    EKG None  Radiology CT Head Wo Contrast  Result Date: 06/02/2023 CLINICAL DATA:  47 year old female with right side headache,  migraine with flare for the past 3 months, progressive for the past 2 weeks. EXAM: CT HEAD WITHOUT CONTRAST TECHNIQUE: Contiguous axial images were obtained from the base of the skull through the vertex without intravenous contrast. RADIATION DOSE REDUCTION: This exam was performed according to the departmental dose-optimization program which includes automated exposure control, adjustment of the mA and/or kV according to patient size and/or use of iterative reconstruction technique. COMPARISON:  Report of brain MRI 04/15/2002 (no images available). FINDINGS: Brain: Cerebral volume is within normal limits for age. No midline shift, ventriculomegaly, mass effect, evidence of mass lesion, intracranial hemorrhage or evidence of cortically based acute infarction. Gray-white matter differentiation is within normal limits throughout the brain. Vascular: No suspicious intracranial vascular hyperdensity. Skull: Intact.  No acute osseous abnormality identified. Sinuses/Orbits: Visualized paranasal sinuses and mastoids are clear. Other: Visualized orbits and scalp soft tissues are within normal limits. IMPRESSION: No acute intracranial abnormality. Negative noncontrast CT appearance of the brain. Electronically Signed   By: Odessa Fleming M.D.   On: 06/02/2023 10:54    Procedures Procedures    Medications Ordered in ED Medications  lidocaine (LIDODERM) 5 % 1 patch (1 patch Transdermal Patch Applied 06/02/23 1056)  sodium chloride 0.9 % bolus 1,000 mL (0 mLs Intravenous Stopped 06/02/23 1151)  ketorolac (TORADOL) 15 MG/ML injection 15 mg (15 mg Intravenous Given 06/02/23 1050)  droperidol (INAPSINE) 2.5 MG/ML injection 1.25 mg (1.25 mg Intravenous Given 06/02/23 1051)  dexamethasone (DECADRON) injection 10 mg (10 mg Intravenous Given 06/02/23 1050)  potassium chloride SA (KLOR-CON M) CR tablet 40 mEq (40 mEq Oral Given 06/02/23 1054)  ondansetron (ZOFRAN) injection 4 mg (4 mg Intravenous Given 06/02/23 1152)    ED Course/  Medical Decision Making/ A&P                                 Medical Decision Making  This patient presents  to the ED for concern of migraine, this involves an extensive number of treatment options, and is a complaint that carries with it a high risk of complications and morbidity.  The differential diagnosis includes migraine/tension/cluster headache, CVA, cerebral venous thrombosis, carotid artery/vertebral artery dissection, malignancy, other   Co morbidities that complicate the patient evaluation  See HPI   Additional history obtained:  Additional history obtained from EMR External records from outside source obtained and reviewed including hospital records   Lab Tests:  I Ordered, and personally interpreted labs.  The pertinent results include: No leukocytosis.  No evidence of anemia.  Placed within range.  Mild hypokalemia 2.8, hyponatremia 134, chloremia of 95.  No renal dysfunction.   Imaging Studies ordered:  I ordered imaging studies including CT head I independently visualized and interpreted imaging which showed no acute intracranial abnormality I agree with the radiologist interpretation   Cardiac Monitoring: / EKG:  The patient was maintained on a cardiac monitor.  I personally viewed and interpreted the cardiac monitored which showed an underlying rhythm of: Sinus rhythm   Consultations Obtained:  N/a   Problem List / ED Course / Critical interventions / Medication management  Patient presents for headache I ordered medication including Toradol, Lidoderm, droperidol, Decadron, normal saline, potassium chloride Reevaluation of the patient after these medicines showed that the patient improved I have reviewed the patients home medicines and have made adjustments as needed   Social Determinants of Health:  Denies tobacco, licit drug use   Test / Admission - Considered:  Headache Vitals signs within normal range and stable throughout  visit. Laboratory/imaging studies significant for: See above 47 year old female presents emergency department with complaints of headache.  Patient with extensive history of migraine type headache of which she has had a follow-up with neurology in the outpatient setting several years ago.  Patient reports headache over the past few months with acute worsening over the past few days.  Pain refractory to at home over-the-counter medications as well as Bernita Raisin prescribed by primary care provider.  On exam, patient without any focal neurologic deficits but given concern for worsening headaches from her baseline, CT imaging was ordered which was negative for any acute abnormality.  Doubt CVA.  Patient at risk factors for thromboembolic event with no neurologic deficits; low suspicion for cerebral venous thrombosis.  Patient without nuchal rigidity, fever, neurologic deficit; low suspicion for meningitis/encephalitis.  Patient without temporal tenderness, abnormal visual deficits from prior headache; low suspicion for giant cell arteritis.  Doubt IIH, MS.  Patient treated with migraine cocktail while in the ED and did note significant improvement of symptoms although still with lingering headache.  Will recommend follow-up with her neurologist in the outpatient setting; given that has been 10+ years, will send in referral for ago for neurology for follow-up.  Will recommend continued use of Ubrelvy as needed for headache given concern for possibly rebound headache given amount of OTC medications used for treatment of headache over the past 3 months.  Treatment plan discussed at length with patient and she acknowledged understanding was agreeable to said plan.  Patient overall well-appearing, afebrile in no acute distress. Worrisome signs and symptoms were discussed with the patient, and the patient acknowledged understanding to return to the ED if noticed. Patient was stable upon discharge.          Final  Clinical Impression(s) / ED Diagnoses Final diagnoses:  Bad headache    Rx / DC Orders ED Discharge Orders  Ordered    potassium chloride SA (KLOR-CON M) 20 MEQ tablet  Daily        06/02/23 1253    lidocaine (LIDODERM) 5 %  Every 24 hours        06/02/23 1225    Ambulatory referral to Neurology       Comments: An appointment is requested in approximately: 2 weeks   06/02/23 1225              Peter Garter, [place] 06/02/23 1253    Gloris Manchester, MD 06/02/23 [phone]

## 2023-06-02 NOTE — Discharge Instructions (Signed)
As discussed, workup today overall reassuring.  Lab studies did show slight decrease in potassium of which we will continue to give you in the next few days.  Will recommend very close follow-up with neurology in the outpatient setting for further assessment/evaluation of your symptoms.  Please do not hesitate to return to the emergency department for worrisome signs and symptoms we discussed become apparent.

## 2023-06-02 NOTE — ED Triage Notes (Signed)
Pt c/o a migraine that is more R-sided that has "been flaring up for the past 3 months but the past two weeks has progressively gotten worse" pt is on medication for migraines, states she has taken those medications with no relief rates pain 10/10

## 2023-06-02 NOTE — ED Notes (Signed)
Patient transported to CT 

## 2023-06-04 ENCOUNTER — Other Ambulatory Visit: Payer: Self-pay | Admitting: Family Medicine

## 2023-06-05 ENCOUNTER — Telehealth: Payer: Self-pay

## 2023-06-05 NOTE — Telephone Encounter (Signed)
Nurses I read through [facility] note  Certainly I am sympathetic to what is going on There is a lot of issues addressed within this  1.  As for the migraines please make sure referral coordinator has made sure that the referral has been sent.  Typically with migraines the specialist will see them when they have a first available appointment.  Unfortunately specialist do not treat this issue as a medical emergency issue so therefore it could take a few weeks  #2 liver enzymes more than likely related to fatty liver estradiol could have some role  #3 I reviewed the CT scan it appears no acute findings  #4 I would recommend a follow-up visit for [facility] for her migraines-if possible to have that in a open slot somewhere within the next 2 to 3 weeks (My schedule is had unprecedented amount of request therefore it is been full recently)  Please work with the front staff to do the best we can for her thank you

## 2023-06-05 NOTE — Telephone Encounter (Signed)
[place] pharmacy called and stated that estradiol (VIVELLE-DOT) 0.1 MG/24HR patch was sent in but patient states it was supposed to be 0.075 mg.

## 2023-06-07 NOTE — Telephone Encounter (Signed)
See My chart message

## 2023-07-16 ENCOUNTER — Other Ambulatory Visit: Payer: Self-pay | Admitting: Family Medicine

## 2023-07-27 ENCOUNTER — Other Ambulatory Visit (INDEPENDENT_AMBULATORY_CARE_PROVIDER_SITE_OTHER): Payer: Self-pay | Admitting: Gastroenterology

## 2023-07-27 DIAGNOSIS — K5732 Diverticulitis of large intestine without perforation or abscess without bleeding: Secondary | ICD-10-CM

## 2023-07-27 MED ORDER — AMOXICILLIN-POT CLAVULANATE 875-125 MG PO TABS
1.0000 | ORAL_TABLET | Freq: Two times a day (BID) | ORAL | 0 refills | Status: DC
Start: 2023-07-27 — End: 2023-08-22

## 2023-08-08 ENCOUNTER — Encounter: Payer: Self-pay | Admitting: Family Medicine

## 2023-08-08 NOTE — Telephone Encounter (Signed)
See duplicate message in patient's chart

## 2023-08-13 ENCOUNTER — Telehealth (INDEPENDENT_AMBULATORY_CARE_PROVIDER_SITE_OTHER): Payer: Self-pay | Admitting: Gastroenterology

## 2023-08-13 NOTE — Telephone Encounter (Signed)
Pt sent my chart message in regards to if Dr.Castaneda January is out yet so we can schedule procedures.  Sent pt reply back via my chart and also left message to return call.

## 2023-08-15 ENCOUNTER — Encounter: Payer: Self-pay | Admitting: Family Medicine

## 2023-08-22 ENCOUNTER — Ambulatory Visit: Payer: No Typology Code available for payment source | Admitting: Family Medicine

## 2023-08-22 VITALS — BP 114/76 | HR 74 | Temp 97.9°F | Ht 62.0 in | Wt 152.8 lb

## 2023-08-22 DIAGNOSIS — E785 Hyperlipidemia, unspecified: Secondary | ICD-10-CM

## 2023-08-22 DIAGNOSIS — I10 Essential (primary) hypertension: Secondary | ICD-10-CM | POA: Diagnosis not present

## 2023-08-22 DIAGNOSIS — Z0001 Encounter for general adult medical examination with abnormal findings: Secondary | ICD-10-CM

## 2023-08-22 DIAGNOSIS — E1169 Type 2 diabetes mellitus with other specified complication: Secondary | ICD-10-CM | POA: Diagnosis not present

## 2023-08-22 DIAGNOSIS — R5383 Other fatigue: Secondary | ICD-10-CM

## 2023-08-22 DIAGNOSIS — R7303 Prediabetes: Secondary | ICD-10-CM

## 2023-08-22 DIAGNOSIS — E119 Type 2 diabetes mellitus without complications: Secondary | ICD-10-CM

## 2023-08-22 DIAGNOSIS — R748 Abnormal levels of other serum enzymes: Secondary | ICD-10-CM | POA: Diagnosis not present

## 2023-08-22 DIAGNOSIS — Z0000 Encounter for general adult medical examination without abnormal findings: Secondary | ICD-10-CM

## 2023-08-22 NOTE — Progress Notes (Signed)
Subjective:    Patient ID: Alicia Owens, female    DOB: [birth year], 47 y.o.   MRN: [id number]  HPI The patient comes in today for a wellness visit. Very nice patient She does try to do a good job of healthy eating regular physical activity She has brought her weight down approximately 17 pounds She was diagnosed with diabetes but hopefully her A1c looks better now that she has lost weight She tries to fit and walking on a regular basis with healthy eating History of Present Illness   The patient, a 47 year old with a history of asthma, irritable bowel syndrome (IBS), and diverticulitis, presents for a wellness visit. The patient reports maintaining a healthy lifestyle, including regular walking for exercise and a balanced diet. However, she has been experiencing increased IBS symptoms over the past three weeks, characterized by predominantly loose stools with occasional constipation.  The patient also reports intermittent chest tightness, lasting approximately 30 minutes, which occurs even during periods of rest. The nature of this discomfort is described as a sensation of tightness in the chest, which does not appear to be associated with breathing difficulties.  The patient has a history of thyroid issues, but no recent abnormalities have been reported. She underwent a hysterectomy in August 2003, with removal of the ovaries. The patient denies any current smoking habits, but admits to rare occasions of smoking.  The patient's medication regimen includes albuterol for asthma, Buspar, Flexeril, dicyclomine for IBS, a Tylenol patch, Allegra for allergies, gabapentin for back pain, hydrochlorothiazide with potassium, Singulair, Protonix, and topiramate for headaches. The patient reports a significant reduction in headache frequency since starting an estrogen patch, with current frequency at approximately once or twice a month.  The patient has been proactive in preventive health measures,  including regular mammograms and colonoscopies. The most recent mammogram was in May of the current year, and a colonoscopy is scheduled for January. The patient received a flu shot in early November.  The patient also reports a recent weight loss, which may have an impact on her previously elevated cholesterol and A1C levels. These will be re-evaluated in upcoming lab work. The patient also reports a history of elevated liver enzymes, which will be rechecked.  In summary, the patient presents with a recent increase in IBS symptoms and intermittent chest tightness. She maintains a healthy lifestyle and is proactive in preventive health measures. The patient's medication regimen appears to be effective in managing her various conditions, with a notable improvement in headache frequency. Upcoming lab work will provide further insight into the patient's current health status.        A review of their health history was completed.  A review of medications was also completed.  Any needed refills; Unsure  Eating habits: Okay  Falls/  MVA accidents in past few months: No  Regular exercise: yes  Specialist pt sees on regular basis: Gastro  Preventative health issues were discussed.   Additional concerns:  Not pelvic or breast exam needed at this time   Review of Systems     Objective:   Physical Exam  General-in no acute distress Eyes-no discharge Lungs-respiratory rate normal, CTA CV-no murmurs,RRR Extremities skin warm dry no edema Neuro grossly normal Behavior normal, alert       Assessment & Plan:  Assessment and Plan    Irritable Bowel Syndrome Recent exacerbation with loose stools. No blood in stools. Currently on as-needed Dicyclomine. -Continue Dicyclomine as needed.  Diverticulitis Recent flare-up. Colonoscopy scheduled for  next month. -Continue current management and proceed with colonoscopy as planned.  Hyperlipidemia LDL elevated at 166 in June. -Order  lipid panel to reassess cholesterol levels.  Prediabetes A1C was 6.5 in June. -Order A1C to monitor glucose control.  Chest Tightness Reports of intermittent chest tightness, not associated with exertion. No history of heart disease, but has risk factors including hyperlipidemia and prediabetes. -Order coronary calcium test for risk stratification of heart disease.  General Health Maintenance -Continue annual mammograms. Last one in May was normal. -Received flu shot in November. -Order labs including liver enzymes, kidney function, potassium, and thyroid function. -Order urine test to check for protein. -Schedule follow-up in 6 months.      1. Well adult exam (Primary) Adult wellness-complete.wellness physical was conducted today. Importance of diet and exercise were discussed in detail.  Importance of stress reduction and healthy living were discussed.  In addition to this a discussion regarding safety was also covered.  We also reviewed over immunizations and gave recommendations regarding current immunization needed for age.   In addition to this additional areas were also touched on including: Preventative health exams needed:  Colonoscopy patient is open to going ahead with a colonoscopy  Patient was advised yearly wellness exam   2. Elevated liver enzymes Check liver profile hopefully with her weight loss her liver enzymes have come down - Hepatic Function Panel  3. Diabetes mellitus without complication (HCC) Hopefully your A1c looks better but may need to institute medicine depending on the result - Hemoglobin A1c - Basic Metabolic Panel  4. Primary hypertension Blood pressure good control continue current measures healthy diet recommended  5. Hyperlipidemia, unspecified hyperlipidemia type Hopefully her numbers look better may end up needing to be on statins we did discuss about coronary calcium as restratification she agrees to do so - Lipid Panel - CT  CARDIAC SCORING (SELF PAY ONLY)  6. Other fatigue Check thyroid function - TSH + free T4

## 2023-08-22 NOTE — Progress Notes (Signed)
  Subjective:     Patient ID: Alicia Owens, female   DOB: 11-23-1975, 47 y.o.   MRN: [id number]  HPI   Review of Systems     Objective:   Physical Exam     Assessment:     ***    Plan:     ***

## 2023-08-24 ENCOUNTER — Other Ambulatory Visit: Payer: Self-pay

## 2023-08-24 DIAGNOSIS — Z1211 Encounter for screening for malignant neoplasm of colon: Secondary | ICD-10-CM

## 2023-08-27 ENCOUNTER — Encounter (INDEPENDENT_AMBULATORY_CARE_PROVIDER_SITE_OTHER): Payer: Self-pay | Admitting: *Deleted

## 2023-08-27 ENCOUNTER — Ambulatory Visit (INDEPENDENT_AMBULATORY_CARE_PROVIDER_SITE_OTHER): Payer: 59 | Admitting: Gastroenterology

## 2023-08-31 LAB — HEPATIC FUNCTION PANEL
ALT: 21 [IU]/L (ref 0–32)
AST: 15 [IU]/L (ref 0–40)
Albumin: 4.4 g/dL (ref 3.9–4.9)
Alkaline Phosphatase: 79 [IU]/L (ref 44–121)
Bilirubin Total: 0.2 mg/dL (ref 0.0–1.2)
Bilirubin, Direct: 0.08 mg/dL (ref 0.00–0.40)
Total Protein: 7 g/dL (ref 6.0–8.5)

## 2023-08-31 LAB — BASIC METABOLIC PANEL
BUN/Creatinine Ratio: 12 (ref 9–23)
BUN: 8 mg/dL (ref 6–24)
CO2: 26 mmol/L (ref 20–29)
Calcium: 9.7 mg/dL (ref 8.7–10.2)
Chloride: 100 mmol/L (ref 96–106)
Creatinine, Ser: 0.69 mg/dL (ref 0.57–1.00)
Glucose: 101 mg/dL — ABNORMAL HIGH (ref 70–99)
Potassium: 4.1 mmol/L (ref 3.5–5.2)
Sodium: 141 mmol/L (ref 134–144)
eGFR: 108 mL/min/{1.73_m2} (ref >59)

## 2023-08-31 LAB — LIPID PANEL
Chol/HDL Ratio: 3.6 {ratio} (ref 0.0–4.4)
Cholesterol, Total: 224 mg/dL — ABNORMAL HIGH (ref 100–199)
HDL: 62 mg/dL (ref >39)
LDL Chol Calc (NIH): 137 mg/dL — ABNORMAL HIGH (ref 0–99)
Triglycerides: 142 mg/dL (ref 0–149)
VLDL Cholesterol Cal: 25 mg/dL (ref 5–40)

## 2023-08-31 LAB — HEMOGLOBIN A1C
Est. average glucose Bld gHb Est-mCnc: 137 mg/dL
Hgb A1c MFr Bld: 6.4 % — ABNORMAL HIGH (ref 4.8–5.6)

## 2023-08-31 LAB — TSH+FREE T4
Free T4: 0.89 ng/dL (ref 0.82–1.77)
TSH: 0.759 u[IU]/mL (ref 0.450–4.500)

## 2023-09-01 ENCOUNTER — Other Ambulatory Visit: Payer: Self-pay | Admitting: Family Medicine

## 2023-09-03 ENCOUNTER — Encounter (INDEPENDENT_AMBULATORY_CARE_PROVIDER_SITE_OTHER): Payer: Self-pay

## 2023-09-03 NOTE — Telephone Encounter (Signed)
Left message to return call. Will also send mychart message.

## 2023-09-07 ENCOUNTER — Encounter: Payer: Self-pay | Admitting: Family Medicine

## 2023-09-07 ENCOUNTER — Telehealth: Payer: Self-pay | Admitting: Pharmacist

## 2023-09-07 ENCOUNTER — Other Ambulatory Visit: Payer: Self-pay | Admitting: Nurse Practitioner

## 2023-09-07 MED ORDER — PULMICORT FLEXHALER 180 MCG/ACT IN AEPB: 1.0000 | INHALATION_SPRAY | Freq: Two times a day (BID) | RESPIRATORY_TRACT | 5 refills | Status: DC

## 2023-09-07 NOTE — Telephone Encounter (Signed)
 Insurance prefers [employer] submitted, but will likely be denied

## 2023-09-12 NOTE — Telephone Encounter (Signed)
 Left message to return call. Pt did send my chart message stating that 2/10 and 2/17 would be good days but those days are no available.

## 2023-09-17 ENCOUNTER — Ambulatory Visit (INDEPENDENT_AMBULATORY_CARE_PROVIDER_SITE_OTHER): Payer: 59 | Admitting: Gastroenterology

## 2023-09-20 ENCOUNTER — Ambulatory Visit (HOSPITAL_COMMUNITY): Payer: 59

## 2023-10-02 NOTE — Telephone Encounter (Signed)
Hi Alicia Owens, yes I would be happy to take care of this patient , I think I had few cancellations , we can also try there

## 2023-10-04 ENCOUNTER — Encounter: Payer: Self-pay | Admitting: Family Medicine

## 2023-10-05 ENCOUNTER — Encounter (INDEPENDENT_AMBULATORY_CARE_PROVIDER_SITE_OTHER): Payer: Self-pay

## 2023-10-05 NOTE — Telephone Encounter (Signed)
Pt replied via my chart. Scheduled for 11/07/23 at 11:15am. Pt requesting tablets. Sample up front to be picked up. Will placed instructions with tablets if pt can come pick up sample. If sample to be sent to pharmacy I will mail instructions.

## 2023-10-08 ENCOUNTER — Encounter: Payer: Self-pay | Admitting: Nurse Practitioner

## 2023-10-08 NOTE — Telephone Encounter (Signed)
No pa needed per insurance. Instructions printed out and placed with tablet sample.

## 2023-10-09 ENCOUNTER — Encounter (INDEPENDENT_AMBULATORY_CARE_PROVIDER_SITE_OTHER): Payer: Self-pay | Admitting: Gastroenterology

## 2023-10-09 ENCOUNTER — Encounter: Payer: Self-pay | Admitting: Family Medicine

## 2023-10-09 ENCOUNTER — Ambulatory Visit (INDEPENDENT_AMBULATORY_CARE_PROVIDER_SITE_OTHER): Payer: 59 | Admitting: Gastroenterology

## 2023-10-09 ENCOUNTER — Encounter (INDEPENDENT_AMBULATORY_CARE_PROVIDER_SITE_OTHER): Payer: Self-pay

## 2023-10-09 ENCOUNTER — Ambulatory Visit (HOSPITAL_COMMUNITY)
Admission: RE | Admit: 2023-10-09 | Discharge: 2023-10-09 | Disposition: A | Discharge status: Home / Self Care | Payer: 59 | Source: Ambulatory Visit | Attending: Gastroenterology | Admitting: Gastroenterology

## 2023-10-09 ENCOUNTER — Other Ambulatory Visit: Payer: Self-pay

## 2023-10-09 VITALS — BP 118/84 | HR 81 | Temp 98.1°F | Ht 61.5 in | Wt 153.6 lb

## 2023-10-09 DIAGNOSIS — E876 Hypokalemia: Secondary | ICD-10-CM | POA: Diagnosis not present

## 2023-10-09 DIAGNOSIS — F341 Dysthymic disorder: Secondary | ICD-10-CM

## 2023-10-09 DIAGNOSIS — R1013 Epigastric pain: Secondary | ICD-10-CM | POA: Insufficient documentation

## 2023-10-09 MED ORDER — IOHEXOL 300 MG/ML  SOLN
100.0000 mL | Freq: Once | INTRAMUSCULAR | Status: AC | PRN
  Administered 2023-10-09: 100 mL via INTRAVENOUS

## 2023-10-09 MED ORDER — CITALOPRAM HYDROBROMIDE 10 MG PO TABS: 10.0000 mg | ORAL_TABLET | Freq: Every day | ORAL | 1 refills | Status: DC

## 2023-10-09 NOTE — Patient Instructions (Signed)
 We will check some labs and get a CT scan to rule out issues with the pancreas For now, continue to stay well hydrated, you can do a liquid diet or try soft/bland foods as tolerated We will keep scheduled colonoscopy and EGD in march, for now   Follow up 6-8 weeks  It was a pleasure to see you today. I want to create trusting relationships with patients and provide genuine, compassionate, and quality care. I truly value your feedback! please be on the lookout for a survey regarding your visit with me today. I appreciate your input about our visit and your time in completing this!    Jianni Shelden L. Leilanie Rauda, MSN, APRN, AGNP-C Adult-Gerontology Nurse Practitioner [facility] at [facility]

## 2023-10-09 NOTE — Progress Notes (Signed)
 Referring Provider: Alphonsa Glendia LABOR, MD Primary Care Physician:  Alphonsa Glendia LABOR, MD Primary GI Physician: Dr. Eartha  Chief Complaint  Patient presents with   Abdominal Pain    Pt arrives for abdominal pain. Pt states its general abdominal pain but will radiate to lower right side and lower left side closer to pelvic area. Pt recently went off Celexa  but has began taking it again. Pt report nausea and diarrhea. No fever. Appetite changes also   HPI:   Alicia Owens is a 48 y.o. female with past medical history of anemia, anxiety, depression, gastroparesis, GERD, HTN, hyperthyroidism, IBS, IDA    Patient presenting today for abdominal pain  Patient last seen August 2024, at that time had elevation in her LFTs after she started on estradiol .  Also reports no epigastric pain that radiates regular quadrant.  Taking Tums and ginger which also helps some.  Patient admitted to increase Protonix  40 mg to twice daily dosing, schedule EGD/colonoscopy, follow for repeat LFTs consider further serologies if still elevated, good reflux precautions.  Notably patient had CT done in September which showed diverticulitis of the descending colon without abscess, she was recommended to take Augmentin  x 7 days  She is scheduled for EGD and colonoscopy the beginning of March  Present: She notes she had some improvement in upper abdominal pain after her last OV when PPI was increased to BID. Over the past month and a half pain has gotten significantly worse. Pain is usually in epigastric area that radiates to the RUQ and sometimes into her back. She occasionally has some LLQ pain and some pain radiating into her legs. She is s/p cholecystectomy in the past. She has some nausea. Is having more diarrhea recently, very rarely has constipation. Stools range from solid to looser, can have upwards of 4 BMs per day. Appetite has decreased over the past few weeks. She has some heartburn at times, atleast twice per  week. She has had about 14 pounds weight loss since July 2024, she was trying to lose weight August/September but stopped thereafter. Eating sometimes changes the pain and sometimes does not affect it. Gas x and dicyclomine  occasionally improves pain but not always. She sometimes has pain at night that will wake her up. She is not taking any NSAIDs. Has occasional dysphagia, usually with her pills, occasionally will strangle on liquids but no real issues with swallowing foods. Denies any new medications over the past few months     Last Colonoscopy: 2010 normal  Last Endoscopy: 2011 Mild gastritis     2) Otherwise normal examination     s/p small bowl biopsies and CLO test     nothing to account for the abdominal pain, which I think, is     functional     Past Medical History:  Diagnosis Date   Anemia    Anxiety    Chronic abdominal pain    Depression    Gastroparesis    GERD (gastroesophageal reflux disease)    Headache(784.0)    History of abdominal hysterectomy 09/03/2022   Ovaries removed as well.  04/10/2022-Dr. Aleene Boos [place]    Hypertension    Hyperthyroidism 2011   IBS (irritable bowel syndrome)    Iron deficiency anemia, unspecified 10/05/2019   More than likely this is due to heavy cycles.  Patient has ongoing history of heavy cycles.  IFBOT - January 2021   Menorrhagia 10/05/2019   PONV (postoperative nausea and vomiting)    PP care -  C/S 12/17 08/22/2011    Past Surgical History:  Procedure Laterality Date   APPENDECTOMY     BASAL CELL CARCINOMA EXCISION  02/2006   face   BREAST SURGERY     Breast reduction   CESAREAN SECTION  2007 and 2012   x2   CHOLECYSTECTOMY  12/15/2011   Procedure: LAPAROSCOPIC CHOLECYSTECTOMY;  Surgeon: Oneil DELENA Budge, MD;  Location: [facility];  Service: General;  Laterality: N/A;   COLONOSCOPY     ESOPHAGOGASTRODUODENOSCOPY     TUBAL LIGATION     with last c-section   WISDOM TOOTH EXTRACTION  08/2003    Current  Outpatient Medications  Medication Sig Dispense Refill   acetaminophen  (TYLENOL ) 500 MG tablet Take 1 tablet (500 mg total) by mouth every 4 (four) hours as needed for moderate pain or fever. 30 tablet 0   ALPRAZolam  (XANAX ) 0.25 MG tablet TAKE ONE TABLET BY MOUTH TWICE DAILY AS NEEDED 30 tablet 2   benzonatate  (TESSALON ) 100 MG capsule Take 1 capsule (100 mg total) by mouth 3 (three) times daily as needed for cough. 21 capsule 0   budesonide  (PULMICORT  FLEXHALER) 180 MCG/ACT inhaler Inhale 1 puff into the lungs in the morning and at bedtime. 1 each 5   citalopram  (CELEXA ) 10 MG tablet Take 1 tablet (10 mg total) by mouth daily. 90 tablet 1   CRANBERRY PO Take by mouth.     cyclobenzaprine  (FLEXERIL ) 10 MG tablet TAKE ONE (1) TABLET BY MOUTH 3 TIMES DAILY AS NEEDED (BETWEEN MEALS AND AT BEDTIME) 30 tablet 5   dicyclomine  (BENTYL ) 20 MG tablet TAKE ONE TABLET BY MOUTH THREE TIMES DAILY AS NEEDED OR AS DIRECTED 90 tablet 5   estradiol  (VIVELLE -DOT) 0.1 MG/24HR patch Place 1 patch (0.1 mg total) onto the skin 2 (two) times a week. 8 patch 2   fexofenadine (ALLEGRA) 180 MG tablet Take 180 mg by mouth daily.     gabapentin  (NEURONTIN ) 100 MG capsule 2 qhs 60 capsule 5   hydrochlorothiazide  (HYDRODIURIL ) 25 MG tablet TAKE ONE TABLET BY MOUTH EVERY MORNING 90 tablet 0   Lactobacillus-Inulin (CULTURELLE DIGESTIVE HEALTH) CAPS Take 1 capsule by mouth daily.     montelukast  (SINGULAIR ) 10 MG tablet TAKE ONE (1) TABLET BY MOUTH EVERY DAY 90 tablet 1   Multiple Vitamins-Minerals (MULTIVITAMIN WITH MINERALS) tablet Take 1 tablet by mouth daily.     ondansetron  (ZOFRAN ) 8 MG tablet Take by mouth every 8 (eight) hours as needed for nausea or vomiting.     pantoprazole  (PROTONIX ) 40 MG tablet Take 1 tablet (40 mg total) by mouth 2 (two) times daily. 60 tablet 2   potassium chloride  SA (KLOR-CON  M) 20 MEQ tablet Take 1 tablet (20 mEq total) by mouth daily. 4 tablet 0   SUMAtriptan  (IMITREX ) 100 MG tablet May  repeat in 2 hours if headache persists or recurs. 9 tablet 5   SUPER B COMPLEX/C PO Take 1 tablet by mouth daily.      SV CRANBERRY PO Take 1 tablet by mouth daily.      topiramate  (TOPAMAX ) 50 MG tablet TAKE 3 TABLETS BY MOUTH IN THE EVENING 270 tablet 1   Triamcinolone Acetonide (NASACORT AQ NA) Place into the nose.     TURMERIC PO Take 1 capsule by mouth daily.      Ubrogepant  (UBRELVY ) 50 MG TABS Take one tab po at onset of migraine; may repeat dose x 1 after 2 hours if needed 30 tablet 2   VITAMIN D , CHOLECALCIFEROL,  PO Take by mouth 3 (three) times a week.     No current facility-administered medications for this visit.    Allergies as of 10/09/2023 - Review Complete 10/09/2023  Allergen Reaction Noted   Ciprofloxacin   04/02/2018   Codeine  05/03/2009   Flagyl  [metronidazole ]  04/02/2018   Promethazine hcl     Reglan  [metoclopramide ]  11/18/2012   Sulfonamide derivatives  05/03/2009    Family History  Problem Relation Age of Onset   Healthy Daughter    Healthy Son    Hypertension Mother    Hypertension Father    Hyperlipidemia Father    Hyperlipidemia Other    Hypertension Other    Anesthesia problems Neg Hx    Hypotension Neg Hx    Malignant hyperthermia Neg Hx    Pseudochol deficiency Neg Hx     Social History   Socioeconomic History   Marital status: Married    Spouse name: Not on file   Number of children: Not on file   Years of education: Not on file   Highest education level: Not on file  Occupational History   Not on file  Tobacco Use   Smoking status: Never   Smokeless tobacco: Never  Vaping Use   Vaping status: Never Used  Substance and Sexual Activity   Alcohol use: No   Drug use: No   Sexual activity: Yes    Birth control/protection: Surgical  Other Topics Concern   Not on file  Social History Narrative   Not on file   Social Drivers of Health   Financial Resource Strain: Not on file  Food Insecurity: Not on file  Transportation  Needs: Not on file  Physical Activity: Not on file  Stress: Not on file  Social Connections: Not on file    Review of systems General: negative for malaise, night sweats, fever, chills, weight loss Neck: Negative for lumps, goiter, pain and significant neck swelling Resp: Negative for cough, wheezing, dyspnea at rest CV: Negative for chest pain, leg swelling, palpitations, orthopnea GI: denies melena, hematochezia, nausea, vomiting, constipation, dysphagia, odyonophagia, early satiety or unintentional weight loss. +epigastric pain +diarrhea  MSK: Negative for joint pain or swelling, back pain, and muscle pain. Derm: Negative for itching or rash Psych: Denies depression, anxiety, memory loss, confusion. No homicidal or suicidal ideation.  Neuro: negative for tremor, gait imbalance, syncope and seizures. The remainder of the review of systems is noncontributory.  Physical Exam: BP 118/84   Pulse 81   Temp 98.1 F (36.7 C)   Ht 5' 1.5 (1.562 m)   Wt 153 lb 9.6 oz (69.7 kg)   LMP 07/29/2021   BMI 28.55 kg/m  General:   Alert and oriented. No distress noted. Pleasant and cooperative.  Head:  Normocephalic and atraumatic. Eyes:  Conjuctiva clear without scleral icterus. Mouth:  Oral mucosa pink and moist. Good dentition. No lesions. Heart: Normal rate and rhythm, s1 and s2 heart sounds present.  Lungs: Clear lung sounds in all lobes. Respirations equal and unlabored. Abdomen:  +BS, soft, non-tender and non-distended. No rebound or guarding. No HSM or masses noted. Derm: No palmar erythema or jaundice Msk:  Symmetrical without gross deformities. Normal posture. Extremities:  Without edema. Neurologic:  Alert and  oriented x4 Psych:  Alert and cooperative. Normal mood and affect.  Invalid input(s): 6 MONTHS   ASSESSMENT: Alicia Owens is a 48 y.o. female presenting today for epigastric pain  Patient with epigastric pain, which she reports is severe in  nature for the past  1-1.5 months. She is currently scheduled to undergo EGD and Colonoscopy in march. Patient is tearful during the visit and reports pain is quite severe, sometimes waking her at night and radiating into her back. Interestingly enough she also reports occasional LLQ and pain radiating into both legs. No rectal bleeding or melena. On exam she does have significant TTP of epigastric/RUQ region, she is s/p cholecystectomy in the past. She reports remote history of being told she may have had pancreatitis in the past. She notably has diagnosis of functional abdominal pain dating back to 2011 when she presented to [facility] with similar symptoms and had unremarkable workup to include EGD and SIBO testing. She has been off of her anti depressants/anxiolytics but recently started back on this. She does not drink or smoke, does not take any obviously offending meds/supplements that would put her at risk for pancreatitis though certainly cannot rule this out based on presentation. Will obtain CT A/P with contrast, lipase, CMP and CBC. Will plan to proceed with EGD and Colonoscopy in march as already scheduled, for now. If CT is unremarkable, may consider course of carafate  as PUD is also on the differential list.    PLAN:  -CMP, CBC, Lipase -CT A/P with contrast STAT -liquid diet, transition to bland/soft as tolerating -keep scheduled EGD/Colonoscopy -ED precautions if pain becomes severe, vomiting that does not subside or fever -consider carafate  if CT unremarkable   All questions were answered, patient verbalized understanding and is in agreement with plan as outlined above.   Follow Up: 6-8 weeks   Aarna Mihalko L. Jaiyana Canale, MSN, APRN, AGNP-C Adult-Gerontology Nurse Practitioner [facility]

## 2023-10-09 NOTE — Telephone Encounter (Signed)
 Nurses Please send in citalopram  10 mg, #90, 1 daily, 1 refill  You may also share following message with [facility] I agree with you that most recent events causing anxiety for you could be made better by adding the citalopram  to help  It is not unusual to sometimes have various life events create more stress and anxiety.  It is reasonable to be back on the medication.  Please remember it may take 3 to 4 weeks to see significant improvement with the medicine.  Citalopram  is considered to be a fairly safe medicine so if you wanted to stay on it long-term you could.  You should see some improvement with restarting the medication within the next few weeks if you are not seeing that improvement you need to let us  know.  Please feel free to send me an update in a few weeks time.  Feel free to ask if you have any additional questions  Thanks-Dr. Glendia

## 2023-10-10 ENCOUNTER — Other Ambulatory Visit: Payer: Self-pay

## 2023-10-10 ENCOUNTER — Emergency Department (HOSPITAL_COMMUNITY)
Admission: EM | Admit: 2023-10-10 | Discharge: 2023-10-10 | Disposition: A | Discharge status: Home / Self Care | Payer: 59 | Source: Home / Self Care | Attending: Emergency Medicine | Admitting: Emergency Medicine

## 2023-10-10 ENCOUNTER — Other Ambulatory Visit (INDEPENDENT_AMBULATORY_CARE_PROVIDER_SITE_OTHER): Payer: Self-pay | Admitting: Gastroenterology

## 2023-10-10 ENCOUNTER — Encounter (HOSPITAL_COMMUNITY): Payer: Self-pay | Admitting: *Deleted

## 2023-10-10 DIAGNOSIS — E876 Hypokalemia: Secondary | ICD-10-CM | POA: Insufficient documentation

## 2023-10-10 DIAGNOSIS — E871 Hypo-osmolality and hyponatremia: Secondary | ICD-10-CM | POA: Insufficient documentation

## 2023-10-10 DIAGNOSIS — D72829 Elevated white blood cell count, unspecified: Secondary | ICD-10-CM | POA: Insufficient documentation

## 2023-10-10 DIAGNOSIS — R1010 Upper abdominal pain, unspecified: Secondary | ICD-10-CM | POA: Insufficient documentation

## 2023-10-10 DIAGNOSIS — R110 Nausea: Secondary | ICD-10-CM | POA: Insufficient documentation

## 2023-10-10 LAB — COMPREHENSIVE METABOLIC PANEL
AG Ratio: 1.5 (calc) (ref 1.0–2.5)
ALT: 20 U/L (ref 6–29)
ALT: 25 U/L (ref 0–44)
AST: 17 U/L (ref 10–35)
AST: 26 U/L (ref 15–41)
Albumin: 4.9 g/dL (ref 3.6–5.1)
Albumin: 4.6 g/dL (ref 3.5–5.0)
Alkaline Phosphatase: 68 U/L (ref 38–126)
Alkaline phosphatase (APISO): 73 U/L (ref 31–125)
Anion gap: 14 (ref 5–15)
BUN/Creatinine Ratio: 7 (calc) (ref 6–22)
BUN: 5 mg/dL — ABNORMAL LOW (ref 7–25)
BUN: 5 mg/dL — ABNORMAL LOW (ref 6–20)
CO2: 26 mmol/L (ref 20–32)
CO2: 26 mmol/L (ref 22–32)
Calcium: 9.5 mg/dL (ref 8.6–10.2)
Calcium: 9 mg/dL (ref 8.9–10.3)
Chloride: 91 mmol/L — ABNORMAL LOW (ref 98–110)
Chloride: 89 mmol/L — ABNORMAL LOW (ref 98–111)
Creat: 0.68 mg/dL (ref 0.50–0.99)
Creatinine, Ser: 0.68 mg/dL (ref 0.44–1.00)
GFR, Estimated: >60 mL/min (ref >60)
Globulin: 3.3 g/dL (ref 1.9–3.7)
Glucose, Bld: 105 mg/dL — ABNORMAL HIGH (ref 65–99)
Glucose, Bld: 124 mg/dL — ABNORMAL HIGH (ref 70–99)
Potassium: 3.2 mmol/L — ABNORMAL LOW (ref 3.5–5.3)
Potassium: 2.2 mmol/L — CL (ref 3.5–5.1)
Sodium: 130 mmol/L — ABNORMAL LOW (ref 135–146)
Sodium: 129 mmol/L — ABNORMAL LOW (ref 135–145)
Total Bilirubin: 0.6 mg/dL (ref 0.2–1.2)
Total Bilirubin: 0.8 mg/dL (ref 0.0–1.2)
Total Protein: 8.2 g/dL — ABNORMAL HIGH (ref 6.1–8.1)
Total Protein: 8.1 g/dL (ref 6.5–8.1)

## 2023-10-10 LAB — CBC
HCT: 44.8 % (ref 35.0–45.0)
HCT: 42.2 % (ref 36.0–46.0)
Hemoglobin: 15.5 g/dL (ref 11.7–15.5)
Hemoglobin: 15.2 g/dL — ABNORMAL HIGH (ref 12.0–15.0)
MCH: 31.6 pg (ref 27.0–33.0)
MCH: 31.1 pg (ref 26.0–34.0)
MCHC: 34.6 g/dL (ref 32.0–36.0)
MCHC: 36 g/dL (ref 30.0–36.0)
MCV: 91.4 fL (ref 80.0–100.0)
MCV: 86.5 fL (ref 80.0–100.0)
MPV: 10.6 fL (ref 7.5–12.5)
Platelets: 455 10*3/uL — ABNORMAL HIGH (ref 140–400)
Platelets: 433 10*3/uL — ABNORMAL HIGH (ref 150–400)
RBC: 4.9 10*6/uL (ref 3.80–5.10)
RBC: 4.88 MIL/uL (ref 3.87–5.11)
RDW: 11.7 % (ref 11.0–15.0)
RDW: 11.3 % — ABNORMAL LOW (ref 11.5–15.5)
WBC: 15.3 10*3/uL — ABNORMAL HIGH (ref 3.8–10.8)
WBC: 12.3 10*3/uL — ABNORMAL HIGH (ref 4.0–10.5)
nRBC: 0 % (ref 0.0–0.2)

## 2023-10-10 LAB — BASIC METABOLIC PANEL
Anion gap: 10 (ref 5–15)
BUN: 5 mg/dL — ABNORMAL LOW (ref 6–20)
CO2: 24 mmol/L (ref 22–32)
Calcium: 8.4 mg/dL — ABNORMAL LOW (ref 8.9–10.3)
Chloride: 96 mmol/L — ABNORMAL LOW (ref 98–111)
Creatinine, Ser: 0.69 mg/dL (ref 0.44–1.00)
GFR, Estimated: >60 mL/min (ref >60)
Glucose, Bld: 102 mg/dL — ABNORMAL HIGH (ref 70–99)
Potassium: 3.7 mmol/L (ref 3.5–5.1)
Sodium: 130 mmol/L — ABNORMAL LOW (ref 135–145)

## 2023-10-10 LAB — MAGNESIUM: Magnesium: 2.1 mg/dL (ref 1.7–2.4)

## 2023-10-10 LAB — DIFFERENTIAL
Abs Immature Granulocytes: 0.03 10*3/uL (ref 0.00–0.07)
Basophils Absolute: 0 10*3/uL (ref 0.0–0.1)
Basophils Relative: 0 %
Eosinophils Absolute: 0 10*3/uL (ref 0.0–0.5)
Eosinophils Relative: 0 %
Immature Granulocytes: 0 %
Lymphocytes Relative: 19 %
Lymphs Abs: 2.4 10*3/uL (ref 0.7–4.0)
Monocytes Absolute: 0.7 10*3/uL (ref 0.1–1.0)
Monocytes Relative: 5 %
Neutro Abs: 9.3 10*3/uL — ABNORMAL HIGH (ref 1.7–7.7)
Neutrophils Relative %: 76 %

## 2023-10-10 LAB — URINALYSIS, ROUTINE W REFLEX MICROSCOPIC
Bilirubin Urine: NEGATIVE
Glucose, UA: NEGATIVE mg/dL
Hgb urine dipstick: NEGATIVE
Ketones, ur: 80 mg/dL — AB
Leukocytes,Ua: NEGATIVE
Nitrite: NEGATIVE
Protein, ur: NEGATIVE mg/dL
Specific Gravity, Urine: 1.017 (ref 1.005–1.030)
pH: 7 (ref 5.0–8.0)

## 2023-10-10 LAB — LIPASE, BLOOD: Lipase: 29 U/L (ref 11–51)

## 2023-10-10 LAB — LIPASE: Lipase: 25 U/L (ref 7–60)

## 2023-10-10 MED ORDER — ALUM & MAG HYDROXIDE-SIMETH 200-200-20 MG/5ML PO SUSP
30.0000 mL | Freq: Once | ORAL | Status: AC
  Administered 2023-10-10: 30 mL via ORAL
  Filled 2023-10-10: qty 30

## 2023-10-10 MED ORDER — MAGNESIUM SULFATE 2 GM/50ML IV SOLN
2.0000 g | Freq: Once | INTRAVENOUS | Status: AC
  Administered 2023-10-10: 2 g via INTRAVENOUS
  Filled 2023-10-10: qty 50

## 2023-10-10 MED ORDER — LIDOCAINE VISCOUS HCL 2 % MT SOLN
15.0000 mL | Freq: Once | OROMUCOSAL | Status: AC
  Administered 2023-10-10: 15 mL via ORAL
  Filled 2023-10-10: qty 15

## 2023-10-10 MED ORDER — MORPHINE SULFATE (PF) 4 MG/ML IV SOLN
4.0000 mg | Freq: Once | INTRAVENOUS | Status: AC
  Administered 2023-10-10: 4 mg via INTRAVENOUS
  Filled 2023-10-10: qty 1

## 2023-10-10 MED ORDER — ONDANSETRON HCL 4 MG PO TABS: 4.0000 mg | ORAL_TABLET | Freq: Three times a day (TID) | ORAL | 0 refills | Status: DC | PRN

## 2023-10-10 MED ORDER — POTASSIUM CHLORIDE 20 MEQ PO PACK
80.0000 meq | PACK | Freq: Once | ORAL | Status: AC
  Administered 2023-10-10: 80 meq via ORAL
  Filled 2023-10-10: qty 4

## 2023-10-10 MED ORDER — ALUMINUM & MAGNESIUM HYDROXIDE 200-200 MG/5ML PO SUSP: 15.0000 mL | Freq: Four times a day (QID) | ORAL | 0 refills | Status: DC | PRN

## 2023-10-10 MED ORDER — LIDOCAINE VISCOUS HCL 2 % MT SOLN: 30.0000 mL | Freq: Two times a day (BID) | OROMUCOSAL | 2 refills | Status: DC | PRN

## 2023-10-10 MED ORDER — POTASSIUM CHLORIDE 10 MEQ/100ML IV SOLN
10.0000 meq | INTRAVENOUS | Status: AC
  Administered 2023-10-10 (×3): 10 meq via INTRAVENOUS
  Filled 2023-10-10 (×3): qty 100

## 2023-10-10 MED ORDER — SUCRALFATE 1 G PO TABS: 1.0000 g | ORAL_TABLET | Freq: Three times a day (TID) | ORAL | 1 refills | Status: DC

## 2023-10-10 MED ORDER — FENTANYL CITRATE PF 50 MCG/ML IJ SOSY
25.0000 ug | PREFILLED_SYRINGE | Freq: Once | INTRAMUSCULAR | Status: AC
  Administered 2023-10-10: 25 ug via INTRAVENOUS
  Filled 2023-10-10: qty 1

## 2023-10-10 MED ORDER — SUCRALFATE 1 G PO TABS
1.0000 g | ORAL_TABLET | Freq: Three times a day (TID) | ORAL | Status: DC
  Administered 2023-10-10: 1 g via ORAL
  Filled 2023-10-10: qty 1

## 2023-10-10 MED ORDER — MORPHINE SULFATE (PF) 2 MG/ML IV SOLN
2.0000 mg | Freq: Once | INTRAVENOUS | Status: AC
  Administered 2023-10-10: 2 mg via INTRAVENOUS
  Filled 2023-10-10: qty 1

## 2023-10-10 MED ORDER — ONDANSETRON HCL 4 MG/2ML IJ SOLN
4.0000 mg | Freq: Once | INTRAMUSCULAR | Status: AC
  Administered 2023-10-10: 4 mg via INTRAVENOUS
  Filled 2023-10-10: qty 2

## 2023-10-10 NOTE — Discharge Instructions (Addendum)
 They were seen for abdominal pain with nausea.  Please call your gastroenterologist first thing in the morning to arrange close follow-up.  Please pick up your medications and take as prescribed.  You may also take Tylenol  as needed for pain.  Please follow-up with your gastroenterologist as soon as possible.  Thank you for letting us  treat you today. After performing a physical exam and reviewing your labs/imaging from today and yesterday at your gastroenterologist, I feel you are safe to go home. Please follow up with your PCP in the next several days and provide them with your records from this visit. Return to the Emergency Room if pain becomes severe or symptoms worsen.

## 2023-10-10 NOTE — ED Notes (Signed)
 Pt asked for more ice chips

## 2023-10-10 NOTE — ED Triage Notes (Signed)
 Pt arrived via [facility] from home c/o severe abdominal pain. Pt contacted her GI Office and was told to come to ER for evaluation. Pt recently had blood work and CT Scan performed and reports having an elevated WBC count.

## 2023-10-10 NOTE — ED Provider Notes (Signed)
 [facility]PARTMENT AT [facility] Provider Note   CSN: [id number] Arrival date & time: 10/10/23  1139     History  Chief Complaint  Patient presents with   Abdominal Pain    Alicia Owens is a 48 y.o. female past medical history significant for IBS, diverticulitis, abdominal hysterectomy, elevated liver enzymes, and GERD presents today for abdominal pain.  Patient contacted her gastroenterologist office and was told to come here for evaluation after having labs and CT yesterday.  Patient endorses nausea, chills, loose stools and lightheadedness.  Patient denies fever, emesis, dysuria, hematuria, headache, congestion, cough, shortness of breath, or chest pain.  Patient states abdominal pain is in the epigastric region to the right upper quadrant and around to her right flank at times.   Abdominal Pain Associated symptoms: chills and nausea        Home Medications Prior to Admission medications   Medication Sig Start Date End Date Taking? Authorizing Provider  GI Cocktail (alum & mag hydroxide-simethicone /lidocaine )oral mixture Take 30 mLs by mouth 2 (two) times daily as needed. 10/10/23  Yes Demri Poulton N, PA-C  ondansetron  (ZOFRAN ) 4 MG tablet Take 1 tablet (4 mg total) by mouth every 8 (eight) hours as needed for nausea or vomiting. 10/10/23  Yes Calianna Kim N, PA-C  sucralfate  (CARAFATE ) 1 g tablet Take 1 tablet (1 g total) by mouth 4 (four) times daily -  with meals and at bedtime. Dissolve tablet in 1/2 of water, mix and drink slurry. 10/10/23   Carlan, Chelsea L, NP  acetaminophen  (TYLENOL ) 500 MG tablet Take 1 tablet (500 mg total) by mouth every 4 (four) hours as needed for moderate pain or fever. 05/12/23   Charlyn Sora, MD  ALPRAZolam  (XANAX ) 0.25 MG tablet TAKE ONE TABLET BY MOUTH TWICE DAILY AS NEEDED 06/04/23   Alphonsa Glendia LABOR, MD  benzonatate  (TESSALON ) 100 MG capsule Take 1 capsule (100 mg total) by mouth 3 (three) times daily as needed for cough.  03/06/23   Alphonsa Glendia LABOR, MD  budesonide  (PULMICORT  FLEXHALER) 180 MCG/ACT inhaler Inhale 1 puff into the lungs in the morning and at bedtime. 09/07/23   Mauro Elveria BROCKS, NP  citalopram  (CELEXA ) 10 MG tablet Take 1 tablet (10 mg total) by mouth daily. 10/09/23   Alphonsa Glendia LABOR, MD  CRANBERRY PO Take by mouth.    [provider]  cyclobenzaprine  (FLEXERIL ) 10 MG tablet TAKE ONE (1) TABLET BY MOUTH 3 TIMES DAILY AS NEEDED (BETWEEN MEALS AND AT BEDTIME) 07/16/23   Luking, Glendia LABOR, MD  dicyclomine  (BENTYL ) 20 MG tablet TAKE ONE TABLET BY MOUTH THREE TIMES DAILY AS NEEDED OR AS DIRECTED 12/05/22   Alphonsa Glendia LABOR, MD  estradiol  (VIVELLE -DOT) 0.1 MG/24HR patch Place 1 patch (0.1 mg total) onto the skin 2 (two) times a week. 06/04/23   Hoskins, Carolyn C, NP  fexofenadine (ALLEGRA) 180 MG tablet Take 180 mg by mouth daily.    [provider]  gabapentin  (NEURONTIN ) 100 MG capsule 2 qhs 03/06/23   Alphonsa Glendia LABOR, MD  hydrochlorothiazide  (HYDRODIURIL ) 25 MG tablet TAKE ONE TABLET BY MOUTH EVERY MORNING 09/03/23   Luking, Glendia LABOR, MD  Lactobacillus-Inulin (CULTURELLE DIGESTIVE HEALTH) CAPS Take 1 capsule by mouth daily.    [provider]  montelukast  (SINGULAIR ) 10 MG tablet TAKE ONE (1) TABLET BY MOUTH EVERY DAY 05/31/23   Alphonsa Glendia LABOR, MD  Multiple Vitamins-Minerals (MULTIVITAMIN WITH MINERALS) tablet Take 1 tablet by mouth daily.  [provider]  pantoprazole  (PROTONIX ) 40 MG tablet Take 1 tablet (40 mg total) by mouth 2 (two) times daily. 04/24/23   Carlan, Chelsea L, NP  potassium chloride  SA (KLOR-CON  M) 20 MEQ tablet Take 1 tablet (20 mEq total) by mouth daily. 06/03/23   Silver Wonda LABOR, PA  SUMAtriptan  (IMITREX ) 100 MG tablet May repeat in 2 hours if headache persists or recurs. 03/06/23   Alphonsa Glendia LABOR, MD  SUPER B COMPLEX/C PO Take 1 tablet by mouth daily.     [provider]  SV CRANBERRY PO Take 1 tablet by mouth daily.     [provider]   topiramate  (TOPAMAX ) 50 MG tablet TAKE 3 TABLETS BY MOUTH IN THE EVENING 09/03/23   Alphonsa Glendia LABOR, MD  Triamcinolone Acetonide (NASACORT AQ NA) Place into the nose.    [provider]  TURMERIC PO Take 1 capsule by mouth daily.     [provider]  Ubrogepant  (UBRELVY ) 50 MG TABS Take one tab po at onset of migraine; may repeat dose x 1 after 2 hours if needed 05/30/23   Mauro Elveria BROCKS, NP  VITAMIN D , CHOLECALCIFEROL, PO Take by mouth 3 (three) times a week.    [provider]      Allergies    Ciprofloxacin , Codeine, Flagyl  [metronidazole ], Promethazine hcl, Reglan  [metoclopramide ], and Sulfonamide derivatives    Review of Systems   Review of Systems  Constitutional:  Positive for chills.  Gastrointestinal:  Positive for abdominal pain and nausea.    Physical Exam Updated Vital Signs BP 136/84 (BP Location: Left Arm)   Pulse 73   Temp 98.4 F (36.9 C) (Oral)   Resp 13   LMP 07/29/2021   SpO2 98%  Physical Exam Vitals and nursing note reviewed.  Constitutional:      General: She is not in acute distress.    Appearance: She is well-developed. She is not toxic-appearing or diaphoretic.  HENT:     Head: Normocephalic and atraumatic.     Mouth/Throat:     Mouth: Mucous membranes are moist.  Eyes:     Extraocular Movements: Extraocular movements intact.     Conjunctiva/sclera: Conjunctivae normal.  Cardiovascular:     Rate and Rhythm: Normal rate and regular rhythm.     Heart sounds: Normal heart sounds. No murmur heard. Pulmonary:     Effort: Pulmonary effort is normal. No respiratory distress.     Breath sounds: Normal breath sounds.  Abdominal:     General: Abdomen is flat. A surgical scar is present. Bowel sounds are normal.     Palpations: Abdomen is soft.     Tenderness: There is abdominal tenderness in the right upper quadrant and epigastric area. There is no right CVA tenderness or rebound. Positive signs include Murphy's sign.  Negative signs include Rovsing's sign and McBurney's sign.  Musculoskeletal:        General: No swelling.     Cervical back: Neck supple.  Skin:    General: Skin is warm and dry.     Capillary Refill: Capillary refill takes less than 2 seconds.  Neurological:     Mental Status: She is alert.  Psychiatric:        Mood and Affect: Mood normal.     ED Results / Procedures / Treatments   Labs (all labs ordered are listed, but only abnormal results are displayed) Labs Reviewed  COMPREHENSIVE METABOLIC PANEL - Abnormal; Notable for the following components:  Result Value   Sodium 129 (*)    Potassium 2.2 (*)    Chloride 89 (*)    Glucose, Bld 124 (*)    BUN 5 (*)    All other components within normal limits  CBC - Abnormal; Notable for the following components:   WBC 12.3 (*)    Hemoglobin 15.2 (*)    RDW 11.3 (*)    Platelets 433 (*)    All other components within normal limits  URINALYSIS, ROUTINE W REFLEX MICROSCOPIC - Abnormal; Notable for the following components:   Ketones, ur 80 (*)    All other components within normal limits  DIFFERENTIAL - Abnormal; Notable for the following components:   Neutro Abs 9.3 (*)    All other components within normal limits  BASIC METABOLIC PANEL - Abnormal; Notable for the following components:   Sodium 130 (*)    Chloride 96 (*)    Glucose, Bld 102 (*)    BUN 5 (*)    Calcium  8.4 (*)    All other components within normal limits  LIPASE, BLOOD  MAGNESIUM     EKG None  Radiology CT ABDOMEN PELVIS W CONTRAST Result Date: 10/09/2023 CLINICAL DATA:  Epigastric pain radiating to the back. EXAM: CT ABDOMEN AND PELVIS WITH CONTRAST TECHNIQUE: Multidetector CT imaging of the abdomen and pelvis was performed using the standard protocol following bolus administration of intravenous contrast. RADIATION DOSE REDUCTION: This exam was performed according to the departmental dose-optimization program which includes automated exposure control,  adjustment of the mA and/or kV according to patient size and/or use of iterative reconstruction technique. CONTRAST:  OMNIPAQUE  IOHEXOL  300 MG/ML  SOLN COMPARISON:  05/12/2023 FINDINGS: Lower chest: Lung bases are clear. Hepatobiliary: No focal liver abnormality is seen. Status post cholecystectomy. No biliary dilatation. Pancreas: Unremarkable. No pancreatic ductal dilatation or surrounding inflammatory changes. Spleen: Normal in size without focal abnormality. Adrenals/Urinary Tract: Adrenal glands are unremarkable. Kidneys are normal, without renal calculi, focal lesion, or hydronephrosis. Bladder is unremarkable. Stomach/Bowel: Stomach is within normal limits. Appendix appears surgically absent. No evidence of bowel wall thickening, distention, or inflammatory changes. Vascular/Lymphatic: No significant vascular findings are present. No enlarged abdominal or pelvic lymph nodes. Reproductive: Status post hysterectomy. No adnexal masses. Other: No abdominal wall hernia or abnormality. No abdominopelvic ascites. Musculoskeletal: No acute or significant osseous findings. IMPRESSION: No acute process demonstrated in the abdomen or pelvis. Normal appearance of the pancreas. No evidence of acute pancreatitis. Electronically Signed   By: Elsie Gravely M.D.   On: 10/09/2023 17:14    Procedures Procedures    Medications Ordered in ED Medications  sucralfate  (CARAFATE ) tablet 1 g (1 g Oral Not Given 10/10/23 2129)  alum & mag hydroxide-simeth (MAALOX/MYLANTA) 200-200-20 MG/5ML suspension 30 mL (has no administration in time range)    And  lidocaine  (XYLOCAINE ) 2 % viscous mouth solution 15 mL (has no administration in time range)  morphine  (PF) 4 MG/ML injection 4 mg (has no administration in time range)  potassium chloride  10 mEq in 100 mL IVPB (0 mEq Intravenous Stopped 10/10/23 1744)  magnesium  sulfate IVPB 2 g 50 mL (0 g Intravenous Stopped 10/10/23 1437)  potassium chloride  (KLOR-CON ) packet 80 mEq  (80 mEq Oral Given 10/10/23 1338)  ondansetron  (ZOFRAN ) injection 4 mg (4 mg Intravenous Given 10/10/23 1411)  morphine  (PF) 2 MG/ML injection 2 mg (2 mg Intravenous Given 10/10/23 1411)  fentaNYL  (SUBLIMAZE ) injection 25 mcg (25 mcg Intravenous Given 10/10/23 1543)  fentaNYL  (SUBLIMAZE ) injection 25 mcg (  25 mcg Intravenous Given 10/10/23 1813)  alum & mag hydroxide-simeth (MAALOX/MYLANTA) 200-200-20 MG/5ML suspension 30 mL (30 mLs Oral Given 10/10/23 1949)    And  lidocaine  (XYLOCAINE ) 2 % viscous mouth solution 15 mL (15 mLs Oral Given 10/10/23 1949)  morphine  (PF) 4 MG/ML injection 4 mg (4 mg Intravenous Given 10/10/23 2003)  ondansetron  (ZOFRAN ) injection 4 mg (4 mg Intravenous Given 10/10/23 2003)    ED Course/ Medical Decision Making/ A&P                                 Medical Decision Making Amount and/or Complexity of Data Reviewed Labs: ordered.  Risk OTC drugs. Prescription drug management.   This patient presents to the ED with chief complaint(s) of abdominal pain with pertinent past medical history of IBS, GERD, elevated liver enzymes, diverticulitis which further complicates the presenting complaint. The complaint involves an extensive differential diagnosis and also carries with it a high risk of complications and morbidity.    The differential diagnosis includes diverticulitis, pancreatitis, hepatitis, choledocholithiasis, cholecystitis  Additional history obtained: Additional history obtained from family Records reviewed gastroenterology office visit note  ED Course and Reassessment: Potassium repletion  PO and IV 2 g magnesium  given Patient given GI cocktail Patient tolerating p.o. intake  Independent labs interpretation:  The following labs were independently interpreted:  CBC: Mild leukocytosis at 12.3, mildly elevated hemoglobin at 15.2, mildly elevated platelets at 4.33 CMP: Hyponatremia 129, hypokalemia at 2.2, decreased chloride at 89, mildly decreased 0.5 Lipase:  29 Magnesium : 2.1 EKG: Sinus rhythm, abnormal R-wave progression UA: 80 ketones  Repeat BNP: Mild hyponatremia 130, improved chloride to 96, improved Potassium to 3.7  Patient had a CT abdomen/pelvis with contrast yesterday which showed no acute process in abdomen or pelvis, normal appearance of pancreas, no evidence of acute pancreatitis.  I do not suspect reimaging would be beneficial as patient's symptoms have not changed.  Consultation: - Consulted or discussed management/test interpretation w/ external professional: Gastroenterology, Dr. Shaaron who is agreeable to discharge with GI cocktail and close follow-up with patient's gastroenterologist  Consideration for admission or further workup: Considered for admission or further workup however patient's vital signs, physical exam, and labs were reassuring.  Patient had a CT yesterday at her gastroenterologist which did not show any acute findings.  Patient able to tolerate p.o. intake without issue prior to discharge.  Patient to follow-up with gastroenterologist as soon as possible.        Final Clinical Impression(s) / ED Diagnoses Final diagnoses:  Pain of upper abdomen  Nausea    Rx / DC Orders ED Discharge Orders          Ordered    ondansetron  (ZOFRAN ) 4 MG tablet  Every 8 hours PRN        10/10/23 2039    aluminum -magnesium  hydroxide 200-200 MG/5ML suspension  Every 6 hours PRN,   Status:  Discontinued        10/10/23 2103    GI Cocktail (alum & mag hydroxide-simethicone /lidocaine )oral mixture  2 times daily PRN        10/10/23 2140              Francis Ileana SAILOR, PA-C 10/10/23 2142    Yolande Lamar BROCKS, MD 10/11/23 1425

## 2023-10-11 ENCOUNTER — Other Ambulatory Visit: Payer: Self-pay | Admitting: Family Medicine

## 2023-10-11 ENCOUNTER — Other Ambulatory Visit (INDEPENDENT_AMBULATORY_CARE_PROVIDER_SITE_OTHER): Payer: Self-pay | Admitting: Gastroenterology

## 2023-10-11 ENCOUNTER — Telehealth: Payer: Self-pay | Admitting: Family Medicine

## 2023-10-11 ENCOUNTER — Ambulatory Visit (INDEPENDENT_AMBULATORY_CARE_PROVIDER_SITE_OTHER): Payer: 59 | Admitting: Gastroenterology

## 2023-10-11 MED ORDER — BUSPIRONE HCL 15 MG PO TABS: ORAL_TABLET | ORAL | 1 refills | Status: DC

## 2023-10-11 MED ORDER — POTASSIUM CHLORIDE CRYS ER 20 MEQ PO TBCR
20.0000 meq | EXTENDED_RELEASE_TABLET | Freq: Every day | ORAL | 5 refills | Status: DC
Start: 2023-10-11 — End: 2023-10-17

## 2023-10-11 NOTE — Telephone Encounter (Signed)
 Nurses Please order metabolic 7, magnesium , CBC, tissue transglutaminase IgA and IgG  Diagnosis diarrhea, abdominal pain, hypokalemia, leukocytosis  Patient will do the blood work on Monday  Should be noted that I had conversation with the patient regarding her ER stay.  Still having abdominal pain but she is on maximal therapy for possible ulcer.  She will be seeing gastroenterology later in the month for EGD and colonoscopy.  We are adding 20 mill equivalent potassium 1 daily with her HCTZ she will monitor her blood pressure and send us  updates  Also sending her refills of BuSpar   Patient has appointment with Elveria tomorrow to discuss her current levels of stress-she was off of her Celexa  for a period of time because things are going better but now she has had a lot of anxiety and worry, we restarted her Celexa  earlier this week, sent in refills of BuSpar   Please order the labs above  Copy this message also being forwarded to [facility] for her information before her office visit with the patient thank you  More than likely patient will need to do a follow-up with us  at [facility] discretion  Patient will keep us  updated via MyChart with any additional issues questions or concerns

## 2023-10-12 ENCOUNTER — Ambulatory Visit: Payer: 59 | Admitting: Nurse Practitioner

## 2023-10-12 ENCOUNTER — Other Ambulatory Visit: Payer: Self-pay

## 2023-10-12 ENCOUNTER — Encounter (HOSPITAL_COMMUNITY): Payer: Self-pay | Admitting: Emergency Medicine

## 2023-10-12 ENCOUNTER — Inpatient Hospital Stay (HOSPITAL_COMMUNITY)
Admission: EM | Admit: 2023-10-12 | Discharge: 2023-10-17 | DRG: 641 | Disposition: A | Discharge status: Home / Self Care | Payer: 59 | Attending: Family Medicine | Admitting: Family Medicine

## 2023-10-12 ENCOUNTER — Ambulatory Visit: Payer: Self-pay | Admitting: Family Medicine

## 2023-10-12 DIAGNOSIS — K3184 Gastroparesis: Secondary | ICD-10-CM | POA: Diagnosis present

## 2023-10-12 DIAGNOSIS — Z85828 Personal history of other malignant neoplasm of skin: Secondary | ICD-10-CM

## 2023-10-12 DIAGNOSIS — R110 Nausea: Secondary | ICD-10-CM | POA: Diagnosis not present

## 2023-10-12 DIAGNOSIS — F32A Depression, unspecified: Secondary | ICD-10-CM | POA: Diagnosis present

## 2023-10-12 DIAGNOSIS — R197 Diarrhea, unspecified: Secondary | ICD-10-CM | POA: Diagnosis present

## 2023-10-12 DIAGNOSIS — D72829 Elevated white blood cell count, unspecified: Secondary | ICD-10-CM

## 2023-10-12 DIAGNOSIS — R1084 Generalized abdominal pain: Secondary | ICD-10-CM | POA: Diagnosis not present

## 2023-10-12 DIAGNOSIS — Z7951 Long term (current) use of inhaled steroids: Secondary | ICD-10-CM | POA: Diagnosis not present

## 2023-10-12 DIAGNOSIS — E039 Hypothyroidism, unspecified: Secondary | ICD-10-CM | POA: Diagnosis present

## 2023-10-12 DIAGNOSIS — Z882 Allergy status to sulfonamides status: Secondary | ICD-10-CM | POA: Diagnosis not present

## 2023-10-12 DIAGNOSIS — R1010 Upper abdominal pain, unspecified: Secondary | ICD-10-CM | POA: Diagnosis not present

## 2023-10-12 DIAGNOSIS — K219 Gastro-esophageal reflux disease without esophagitis: Secondary | ICD-10-CM | POA: Diagnosis present

## 2023-10-12 DIAGNOSIS — F419 Anxiety disorder, unspecified: Secondary | ICD-10-CM | POA: Diagnosis present

## 2023-10-12 DIAGNOSIS — K319 Disease of stomach and duodenum, unspecified: Secondary | ICD-10-CM | POA: Diagnosis not present

## 2023-10-12 DIAGNOSIS — R109 Unspecified abdominal pain: Secondary | ICD-10-CM

## 2023-10-12 DIAGNOSIS — I10 Essential (primary) hypertension: Secondary | ICD-10-CM | POA: Diagnosis present

## 2023-10-12 DIAGNOSIS — R1013 Epigastric pain: Secondary | ICD-10-CM

## 2023-10-12 DIAGNOSIS — K529 Noninfective gastroenteritis and colitis, unspecified: Secondary | ICD-10-CM | POA: Diagnosis present

## 2023-10-12 DIAGNOSIS — K760 Fatty (change of) liver, not elsewhere classified: Secondary | ICD-10-CM | POA: Diagnosis present

## 2023-10-12 DIAGNOSIS — E876 Hypokalemia: Secondary | ICD-10-CM

## 2023-10-12 DIAGNOSIS — Z885 Allergy status to narcotic agent status: Secondary | ICD-10-CM

## 2023-10-12 DIAGNOSIS — Z83438 Family history of other disorder of lipoprotein metabolism and other lipidemia: Secondary | ICD-10-CM | POA: Diagnosis not present

## 2023-10-12 DIAGNOSIS — Z888 Allergy status to other drugs, medicaments and biological substances status: Secondary | ICD-10-CM | POA: Diagnosis not present

## 2023-10-12 DIAGNOSIS — R112 Nausea with vomiting, unspecified: Secondary | ICD-10-CM | POA: Diagnosis not present

## 2023-10-12 DIAGNOSIS — Z9049 Acquired absence of other specified parts of digestive tract: Secondary | ICD-10-CM | POA: Diagnosis not present

## 2023-10-12 DIAGNOSIS — F341 Dysthymic disorder: Secondary | ICD-10-CM | POA: Diagnosis present

## 2023-10-12 DIAGNOSIS — Z79899 Other long term (current) drug therapy: Secondary | ICD-10-CM | POA: Diagnosis not present

## 2023-10-12 DIAGNOSIS — Z8249 Family history of ischemic heart disease and other diseases of the circulatory system: Secondary | ICD-10-CM | POA: Diagnosis not present

## 2023-10-12 DIAGNOSIS — F411 Generalized anxiety disorder: Secondary | ICD-10-CM | POA: Diagnosis not present

## 2023-10-12 LAB — CBC WITH DIFFERENTIAL/PLATELET
Abs Immature Granulocytes: 0.05 10*3/uL (ref 0.00–0.07)
Basophils Absolute: 0 10*3/uL (ref 0.0–0.1)
Basophils Relative: 0 %
Eosinophils Absolute: 0 10*3/uL (ref 0.0–0.5)
Eosinophils Relative: 0 %
HCT: 35.5 % — ABNORMAL LOW (ref 36.0–46.0)
Hemoglobin: 12.5 g/dL (ref 12.0–15.0)
Immature Granulocytes: 1 %
Lymphocytes Relative: 19 %
Lymphs Abs: 1.8 10*3/uL (ref 0.7–4.0)
MCH: 31.9 pg (ref 26.0–34.0)
MCHC: 35.2 g/dL (ref 30.0–36.0)
MCV: 90.6 fL (ref 80.0–100.0)
Monocytes Absolute: 0.5 10*3/uL (ref 0.1–1.0)
Monocytes Relative: 6 %
Neutro Abs: 7 10*3/uL (ref 1.7–7.7)
Neutrophils Relative %: 74 %
Platelets: 330 10*3/uL (ref 150–400)
RBC: 3.92 MIL/uL (ref 3.87–5.11)
RDW: 11.7 % (ref 11.5–15.5)
WBC: 9.4 10*3/uL (ref 4.0–10.5)
nRBC: 0 % (ref 0.0–0.2)

## 2023-10-12 LAB — COMPREHENSIVE METABOLIC PANEL
ALT: 28 U/L (ref 0–44)
AST: 21 U/L (ref 15–41)
Albumin: 2.7 g/dL — ABNORMAL LOW (ref 3.5–5.0)
Alkaline Phosphatase: 48 U/L (ref 38–126)
Anion gap: 9 (ref 5–15)
BUN: <5 mg/dL — ABNORMAL LOW (ref 6–20)
CO2: 18 mmol/L — ABNORMAL LOW (ref 22–32)
Calcium: 6.1 mg/dL — CL (ref 8.9–10.3)
Chloride: 108 mmol/L (ref 98–111)
Creatinine, Ser: 0.47 mg/dL (ref 0.44–1.00)
GFR, Estimated: >60 mL/min (ref >60)
Glucose, Bld: 90 mg/dL (ref 70–99)
Potassium: <2 mmol/L — CL (ref 3.5–5.1)
Sodium: 135 mmol/L (ref 135–145)
Total Bilirubin: 0.4 mg/dL (ref 0.0–1.2)
Total Protein: 5 g/dL — ABNORMAL LOW (ref 6.5–8.1)

## 2023-10-12 LAB — URINALYSIS, ROUTINE W REFLEX MICROSCOPIC
Bilirubin Urine: NEGATIVE
Glucose, UA: NEGATIVE mg/dL
Hgb urine dipstick: NEGATIVE
Ketones, ur: 20 mg/dL — AB
Leukocytes,Ua: NEGATIVE
Nitrite: NEGATIVE
Protein, ur: NEGATIVE mg/dL
Specific Gravity, Urine: 1.004 — ABNORMAL LOW (ref 1.005–1.030)
pH: 7 (ref 5.0–8.0)

## 2023-10-12 LAB — HCG, SERUM, QUALITATIVE: Preg, Serum: NEGATIVE

## 2023-10-12 LAB — LIPASE, BLOOD: Lipase: 28 U/L (ref 11–51)

## 2023-10-12 LAB — PHOSPHORUS: Phosphorus: 1.4 mg/dL — ABNORMAL LOW (ref 2.5–4.6)

## 2023-10-12 LAB — MRSA NEXT GEN BY PCR, NASAL: MRSA by PCR Next Gen: NOT DETECTED

## 2023-10-12 LAB — MAGNESIUM: Magnesium: 1.4 mg/dL — ABNORMAL LOW (ref 1.7–2.4)

## 2023-10-12 MED ORDER — MORPHINE SULFATE (PF) 2 MG/ML IV SOLN
2.0000 mg | Freq: Once | INTRAVENOUS | Status: AC
  Administered 2023-10-12: 2 mg via INTRAVENOUS
  Filled 2023-10-12: qty 1

## 2023-10-12 MED ORDER — LOSARTAN POTASSIUM 50 MG PO TABS
25.0000 mg | ORAL_TABLET | Freq: Every day | ORAL | Status: DC
  Administered 2023-10-12 – 2023-10-17 (×5): 25 mg via ORAL
  Filled 2023-10-12 (×7): qty 1

## 2023-10-12 MED ORDER — POTASSIUM CHLORIDE 10 MEQ/100ML IV SOLN
10.0000 meq | INTRAVENOUS | Status: AC
  Administered 2023-10-12 (×2): 10 meq via INTRAVENOUS
  Filled 2023-10-12 (×2): qty 100

## 2023-10-12 MED ORDER — MONTELUKAST SODIUM 10 MG PO TABS
10.0000 mg | ORAL_TABLET | Freq: Every day | ORAL | Status: DC
  Administered 2023-10-12 – 2023-10-16 (×5): 10 mg via ORAL
  Filled 2023-10-12 (×5): qty 1

## 2023-10-12 MED ORDER — GABAPENTIN 100 MG PO CAPS
200.0000 mg | ORAL_CAPSULE | Freq: Every day | ORAL | Status: DC
  Administered 2023-10-12 – 2023-10-16 (×5): 200 mg via ORAL
  Filled 2023-10-12 (×5): qty 2

## 2023-10-12 MED ORDER — LACTATED RINGERS IV BOLUS
1000.0000 mL | Freq: Once | INTRAVENOUS | Status: AC
  Administered 2023-10-12: 1000 mL via INTRAVENOUS

## 2023-10-12 MED ORDER — LORATADINE 10 MG PO TABS
10.0000 mg | ORAL_TABLET | Freq: Every day | ORAL | Status: DC
Start: 2023-10-12 — End: 2023-10-17
  Administered 2023-10-12 – 2023-10-17 (×5): 10 mg via ORAL
  Filled 2023-10-12 (×7): qty 1

## 2023-10-12 MED ORDER — BUDESONIDE 0.25 MG/2ML IN SUSP
0.2500 mg | Freq: Two times a day (BID) | RESPIRATORY_TRACT | Status: DC
  Administered 2023-10-13: 0.25 mg via RESPIRATORY_TRACT
  Filled 2023-10-12 (×2): qty 2

## 2023-10-12 MED ORDER — POTASSIUM CHLORIDE 10 MEQ/100ML IV SOLN
10.0000 meq | INTRAVENOUS | Status: AC
  Administered 2023-10-12 – 2023-10-13 (×5): 10 meq via INTRAVENOUS
  Filled 2023-10-12 (×4): qty 100

## 2023-10-12 MED ORDER — ONDANSETRON HCL 4 MG/2ML IJ SOLN
4.0000 mg | Freq: Four times a day (QID) | INTRAMUSCULAR | Status: DC | PRN
  Administered 2023-10-12 – 2023-10-17 (×13): 4 mg via INTRAVENOUS
  Filled 2023-10-12 (×13): qty 2

## 2023-10-12 MED ORDER — LOPERAMIDE HCL 2 MG PO CAPS
2.0000 mg | ORAL_CAPSULE | ORAL | Status: DC | PRN
  Administered 2023-10-13 – 2023-10-14 (×4): 2 mg via ORAL
  Filled 2023-10-12 (×4): qty 1

## 2023-10-12 MED ORDER — POTASSIUM CHLORIDE CRYS ER 20 MEQ PO TBCR
40.0000 meq | EXTENDED_RELEASE_TABLET | Freq: Once | ORAL | Status: AC
  Administered 2023-10-12: 40 meq via ORAL
  Filled 2023-10-12: qty 2

## 2023-10-12 MED ORDER — SUCRALFATE 1 G PO TABS
1.0000 g | ORAL_TABLET | Freq: Three times a day (TID) | ORAL | Status: DC
  Administered 2023-10-12 – 2023-10-17 (×17): 1 g via ORAL
  Filled 2023-10-12 (×18): qty 1

## 2023-10-12 MED ORDER — ALPRAZOLAM 0.5 MG PO TABS
0.5000 mg | ORAL_TABLET | Freq: Once | ORAL | Status: AC
  Administered 2023-10-12: 0.5 mg via ORAL
  Filled 2023-10-12: qty 1

## 2023-10-12 MED ORDER — TOPIRAMATE 25 MG PO TABS
150.0000 mg | ORAL_TABLET | Freq: Every day | ORAL | Status: DC
  Administered 2023-10-12 – 2023-10-16 (×5): 150 mg via ORAL
  Filled 2023-10-12 (×5): qty 2

## 2023-10-12 MED ORDER — CHLORHEXIDINE GLUCONATE CLOTH 2 % EX PADS
6.0000 | MEDICATED_PAD | Freq: Every day | CUTANEOUS | Status: DC
  Administered 2023-10-12 – 2023-10-17 (×5): 6 via TOPICAL

## 2023-10-12 MED ORDER — CALCIUM GLUCONATE-NACL 1-0.675 GM/50ML-% IV SOLN
1.0000 g | Freq: Once | INTRAVENOUS | Status: AC
  Administered 2023-10-12: 1000 mg via INTRAVENOUS
  Filled 2023-10-12: qty 50

## 2023-10-12 MED ORDER — ONDANSETRON HCL 4 MG PO TABS
4.0000 mg | ORAL_TABLET | Freq: Four times a day (QID) | ORAL | Status: DC | PRN
  Filled 2023-10-12: qty 1

## 2023-10-12 MED ORDER — POTASSIUM CHLORIDE CRYS ER 20 MEQ PO TBCR
40.0000 meq | EXTENDED_RELEASE_TABLET | Freq: Three times a day (TID) | ORAL | Status: DC
  Administered 2023-10-12 – 2023-10-15 (×9): 40 meq via ORAL
  Filled 2023-10-12 (×8): qty 2

## 2023-10-12 MED ORDER — MAGNESIUM SULFATE 2 GM/50ML IV SOLN
2.0000 g | Freq: Once | INTRAVENOUS | Status: AC
  Administered 2023-10-12: 2 g via INTRAVENOUS
  Filled 2023-10-12: qty 50

## 2023-10-12 MED ORDER — MAGNESIUM SULFATE 50 % IJ SOLN: 2.0000 g | Freq: Once | INTRAMUSCULAR | Status: DC

## 2023-10-12 MED ORDER — CYCLOBENZAPRINE HCL 10 MG PO TABS
10.0000 mg | ORAL_TABLET | Freq: Three times a day (TID) | ORAL | Status: DC | PRN
  Administered 2023-10-13 – 2023-10-16 (×4): 10 mg via ORAL
  Filled 2023-10-12 (×4): qty 1

## 2023-10-12 MED ORDER — LACTATED RINGERS IV SOLN: INTRAVENOUS | Status: AC

## 2023-10-12 MED ORDER — ALUM & MAG HYDROXIDE-SIMETH 200-200-20 MG/5ML PO SUSP
30.0000 mL | ORAL | Status: DC | PRN
  Administered 2023-10-12 – 2023-10-14 (×6): 30 mL via ORAL
  Filled 2023-10-12 (×6): qty 30

## 2023-10-12 MED ORDER — HYDROMORPHONE HCL 1 MG/ML IJ SOLN
0.5000 mg | INTRAMUSCULAR | Status: DC | PRN
  Administered 2023-10-12 – 2023-10-16 (×12): 0.5 mg via INTRAVENOUS
  Filled 2023-10-12 (×12): qty 0.5

## 2023-10-12 MED ORDER — DICYCLOMINE HCL 20 MG PO TABS
20.0000 mg | ORAL_TABLET | Freq: Three times a day (TID) | ORAL | Status: DC
Start: 2023-10-12 — End: 2023-10-13
  Filled 2023-10-12 (×11): qty 1

## 2023-10-12 MED ORDER — POTASSIUM CHLORIDE 10 MEQ/100ML IV SOLN
10.0000 meq | Freq: Once | INTRAVENOUS | Status: DC
  Filled 2023-10-12: qty 100

## 2023-10-12 MED ORDER — ENOXAPARIN SODIUM 40 MG/0.4ML IJ SOSY
40.0000 mg | PREFILLED_SYRINGE | INTRAMUSCULAR | Status: DC
  Administered 2023-10-12 – 2023-10-14 (×3): 40 mg via SUBCUTANEOUS
  Filled 2023-10-12 (×3): qty 0.4

## 2023-10-12 MED ORDER — BUSPIRONE HCL 5 MG PO TABS
15.0000 mg | ORAL_TABLET | Freq: Two times a day (BID) | ORAL | Status: DC
  Administered 2023-10-12 – 2023-10-17 (×9): 15 mg via ORAL
  Filled 2023-10-12 (×10): qty 3

## 2023-10-12 MED ORDER — ALPRAZOLAM 0.25 MG PO TABS
0.2500 mg | ORAL_TABLET | Freq: Two times a day (BID) | ORAL | Status: DC | PRN
  Administered 2023-10-12 – 2023-10-17 (×7): 0.25 mg via ORAL
  Filled 2023-10-12 (×7): qty 1

## 2023-10-12 MED ORDER — PANTOPRAZOLE SODIUM 40 MG PO TBEC
40.0000 mg | DELAYED_RELEASE_TABLET | Freq: Two times a day (BID) | ORAL | Status: DC
  Administered 2023-10-12 – 2023-10-17 (×9): 40 mg via ORAL
  Filled 2023-10-12 (×10): qty 1

## 2023-10-12 NOTE — ED Provider Notes (Signed)
 [place] EMERGENCY DEPARTMENT AT [facility] Provider Note   CSN: [id number] Arrival date & time: 10/12/23  1122     History  Chief Complaint  Patient presents with   Abdominal Pain    Pt bib EMS for abdominal pain,diarrhea and nausea. Seen here recently. Hx of IBS and anxiety. EMS gave 4mg  of zofran  IV.    Alicia Owens is a 48 y.o. female with past medical history of anxiety, GERD, HTN, migraines, gastroparesis, hypothyroidism, IDA, diverticulosis, IBS presents to emergency department via EMS for evaluation of generalized abdominal pain, nausea, diarrhea, different BM odor for the past few weeks.  She endorses that she has had over 5 episodes of diarrhea a day for the past 2 weeks.  She has been taking care of her grandmother who has recently been diagnosed with C. Difficile.  She denies vomiting, hematochezia, melena, fevers but yesterday noticed some yellow stool.  She was evaluated by GI on 10/09/23 for similar symptoms of pain to upper abdomen and RLQ pain. CT negative for acute process. She was recommended for further eval in ED due to leukocytosis 15.3 in setting of continued severe abd pain  On 10/10/23 she sough eval at ED for continued symptoms of abd pain, diarrhea. She was noted to have hypokalemia at 2.2. She was supplemented with oral and IV potassium as well as provided analgesia. GI consulted and recommended strict outpatient f/u as long as she is able to manage oral hydration  Today, she complains of pain in same areas. Diffuse generalized abdominal pain worse in upper abdomen and RLQ with multiple episodes of diarrhea.  She also endorses that she has worsening anxiety over the past 2 days.  She has recently started Celexa  on 10/09/23. She took 0.25 mg Xanax  today without relief.  Husband at bedside was called by COVID health provider today to inform him that she told them that she wanted to harm herself. She is not currently endorsing SI but states she is over  feeling pain and diarrhea.   Abdominal Pain Associated symptoms: no chest pain, no chills, no constipation, no cough, no diarrhea, no fatigue, no fever, no nausea, no shortness of breath and no vomiting      Home Medications Prior to Admission medications   Medication Sig Start Date End Date Taking? Authorizing Provider  acetaminophen  (TYLENOL ) 500 MG tablet Take 500-1,000 mg by mouth every 6 (six) hours as needed for moderate pain (pain score 4-6).   Yes [provider]  ALPRAZolam  (XANAX ) 0.25 MG tablet TAKE ONE TABLET BY MOUTH TWICE DAILY AS NEEDED 06/04/23  Yes Luking, Glendia LABOR, MD  budesonide  (PULMICORT  FLEXHALER) 180 MCG/ACT inhaler Inhale 1 puff into the lungs in the morning and at bedtime. Patient taking differently: Inhale 1-2 puffs into the lungs daily. 09/07/23  Yes Mauro Elveria BROCKS, NP  busPIRone  (BUSPAR ) 15 MG tablet TAKE ONE TABLET BY MOUTH TWICE A DAY 10/11/23  Yes Luking, Glendia LABOR, MD  citalopram  (CELEXA ) 10 MG tablet Take 1 tablet (10 mg total) by mouth daily. 10/09/23  Yes Luking, Glendia LABOR, MD  CRANBERRY PO Take 1 tablet by mouth daily.   Yes [provider]  cyclobenzaprine  (FLEXERIL ) 10 MG tablet TAKE ONE (1) TABLET BY MOUTH 3 TIMES DAILY AS NEEDED (BETWEEN MEALS AND AT BEDTIME) 07/16/23  Yes Luking, Glendia LABOR, MD  dicyclomine  (BENTYL ) 20 MG tablet TAKE ONE TABLET BY MOUTH THREE TIMES DAILY AS NEEDED OR AS DIRECTED 12/05/22  Yes Alphonsa Glendia LABOR, MD  estradiol  (VIVELLE -DOT) 0.1 MG/24HR patch Place 1 patch (0.1 mg total) onto the skin 2 (two) times a week. 06/04/23  Yes Mauro Elveria BROCKS, NP  fexofenadine (ALLEGRA) 180 MG tablet Take 180 mg by mouth daily.   Yes [provider]  gabapentin  (NEURONTIN ) 100 MG capsule 2 qhs 03/06/23  Yes Luking, Scott A, MD  hydrochlorothiazide  (HYDRODIURIL ) 25 MG tablet TAKE ONE TABLET BY MOUTH EVERY MORNING Patient taking differently: Take 25 mg by mouth at bedtime. 09/03/23  Yes Alphonsa Glendia LABOR, MD  Lactobacillus-Inulin  (CULTURELLE DIGESTIVE HEALTH) CAPS Take 1 capsule by mouth daily.   Yes [provider]  montelukast  (SINGULAIR ) 10 MG tablet TAKE ONE (1) TABLET BY MOUTH EVERY DAY 05/31/23  Yes Luking, Glendia LABOR, MD  Multiple Vitamins-Minerals (MULTIVITAMIN WITH MINERALS) tablet Take 1 tablet by mouth daily.   Yes [provider]  ondansetron  (ZOFRAN ) 4 MG tablet Take 1 tablet (4 mg total) by mouth every 8 (eight) hours as needed for nausea or vomiting. 10/10/23  Yes Keith, Kayla N, PA-C  pantoprazole  (PROTONIX ) 40 MG tablet TAKE ONE TABLET BY MOUTH TWICE A DAY 10/11/23  Yes Carlan, Chelsea L, NP  potassium chloride  SA (KLOR-CON  M) 20 MEQ tablet Take 1 tablet (20 mEq total) by mouth daily. 10/11/23  Yes Alphonsa Glendia LABOR, MD  sucralfate  (CARAFATE ) 1 g tablet Take 1 tablet (1 g total) by mouth 4 (four) times daily -  with meals and at bedtime. Dissolve tablet in 1/2 of water, mix and drink slurry. 10/10/23  Yes Carlan, Chelsea L, NP  SUMAtriptan  (IMITREX ) 100 MG tablet May repeat in 2 hours if headache persists or recurs. Patient taking differently: Take 100 mg by mouth every 2 (two) hours as needed for headache or migraine. 03/06/23  Yes Luking, Glendia LABOR, MD  SUPER B COMPLEX/C PO Take 1 tablet by mouth daily.    Yes [provider]  topiramate  (TOPAMAX ) 50 MG tablet TAKE 3 TABLETS BY MOUTH IN THE EVENING 09/03/23  Yes Luking, Glendia LABOR, MD  Triamcinolone Acetonide (NASACORT AQ NA) Place 1 spray into the nose 2 (two) times daily as needed (Rhinitis).   Yes [provider]  TURMERIC PO Take 1 capsule by mouth daily.    Yes [provider]  Ubrogepant  (UBRELVY ) 50 MG TABS Take one tab po at onset of migraine; may repeat dose x 1 after 2 hours if needed 05/30/23  Yes Mauro Elveria C, NP  VITAMIN D , CHOLECALCIFEROL, PO Take 1 tablet by mouth daily.   Yes [provider]  GI Cocktail (alum & mag hydroxide-simethicone /lidocaine )oral mixture Take 30 mLs by mouth 2 (two) times daily as  needed. Patient not taking: Reported on 10/12/2023 10/10/23   Keith, Kayla N, PA-C      Allergies    Ciprofloxacin , Codeine, Flagyl  [metronidazole ], Promethazine hcl, Reglan  [metoclopramide ], and Sulfonamide derivatives    Review of Systems   Review of Systems  Constitutional:  Negative for chills, fatigue and fever.  Respiratory:  Negative for cough, chest tightness, shortness of breath and wheezing.   Cardiovascular:  Negative for chest pain and palpitations.  Gastrointestinal:  Positive for abdominal pain. Negative for constipation, diarrhea, nausea and vomiting.  Neurological:  Negative for dizziness, seizures, weakness, light-headedness, numbness and headaches.  Psychiatric/Behavioral:  The patient is nervous/anxious.     Physical Exam Updated Vital Signs BP 126/84   Pulse 78   Temp 97.6 F (36.4 C) (Oral)   Resp 17   Ht 5' 1.5 (1.562 m)  Wt 69.7 kg   LMP 07/29/2021   SpO2 96%   BMI 28.55 kg/m  Physical Exam Vitals and nursing note reviewed.  Constitutional:      General: She is not in acute distress.    Appearance: Normal appearance. She is not ill-appearing.  HENT:     Head: Normocephalic and atraumatic.  Eyes:     Conjunctiva/sclera: Conjunctivae normal.  Cardiovascular:     Rate and Rhythm: Normal rate.  Pulmonary:     Effort: Pulmonary effort is normal. No respiratory distress.     Breath sounds: Normal breath sounds.  Chest:     Chest wall: No tenderness.  Abdominal:     General: Bowel sounds are normal. There is no distension.     Palpations: Abdomen is soft.     Tenderness: There is abdominal tenderness. There is no guarding or rebound.     Comments: Diffuse generalized abdominal tenderness.  Worse in upper abdomen and right lower quadrant  Musculoskeletal:     Cervical back: Normal range of motion and neck supple. No rigidity.     Right lower leg: No edema.     Left lower leg: No edema.  Skin:    General: Skin is warm.     Capillary Refill:  Capillary refill takes less than 2 seconds.     Coloration: Skin is not jaundiced or pale.  Neurological:     Mental Status: She is alert and oriented to person, place, and time. Mental status is at baseline.  Psychiatric:        Mood and Affect: Mood is anxious. Affect is tearful.        Thought Content: Thought content does not include suicidal ideation. Thought content does not include suicidal plan.     ED Results / Procedures / Treatments   Labs (all labs ordered are listed, but only abnormal results are displayed) Labs Reviewed  CBC WITH DIFFERENTIAL/PLATELET - Abnormal; Notable for the following components:      Result Value   HCT 35.5 (*)    All other components within normal limits  COMPREHENSIVE METABOLIC PANEL - Abnormal; Notable for the following components:   Potassium <2.0 (*)    CO2 18 (*)    BUN <5 (*)    Calcium  6.1 (*)    Total Protein 5.0 (*)    Albumin 2.7 (*)    All other components within normal limits  URINALYSIS, ROUTINE W REFLEX MICROSCOPIC - Abnormal; Notable for the following components:   Color, Urine STRAW (*)    Specific Gravity, Urine 1.004 (*)    Ketones, ur 20 (*)    All other components within normal limits  MAGNESIUM  - Abnormal; Notable for the following components:   Magnesium  1.4 (*)    All other components within normal limits  PHOSPHORUS - Abnormal; Notable for the following components:   Phosphorus 1.4 (*)    All other components within normal limits  LIPASE, BLOOD  HCG, SERUM, QUALITATIVE    EKG None  Radiology No results found.  Procedures .Critical Care  Performed by: Minnie Tinnie BRAVO, PA Authorized by: Minnie Tinnie BRAVO, PA   Critical care provider statement:    Critical care time (minutes):  45   Critical care was necessary to treat or prevent imminent or life-threatening deterioration of the following conditions:  Metabolic crisis   Critical care was time spent personally by me on the following activities:  Development  of treatment plan with patient or surrogate, discussions with consultants,  evaluation of patient's response to treatment, examination of patient, ordering and review of laboratory studies, ordering and review of radiographic studies, ordering and performing treatments and interventions, pulse oximetry, re-evaluation of patient's condition and review of old charts   Care discussed with: admitting provider       Medications Ordered in ED Medications  potassium chloride  10 mEq in 100 mL IVPB (10 mEq Intravenous New Bag/Given 10/12/23 1544)  morphine  (PF) 2 MG/ML injection 2 mg (2 mg Intravenous Given 10/12/23 1348)  ALPRAZolam  (XANAX ) tablet 0.5 mg (0.5 mg Oral Given 10/12/23 1348)  potassium chloride  SA (KLOR-CON  M) CR tablet 40 mEq (40 mEq Oral Given 10/12/23 1525)  calcium  gluconate 1 g/ 50 mL sodium chloride  IVPB (1,000 mg Intravenous New Bag/Given 10/12/23 1543)  morphine  (PF) 2 MG/ML injection 2 mg (2 mg Intravenous Given 10/12/23 1532)  lactated ringers  bolus 1,000 mL (1,000 mLs Intravenous New Bag/Given 10/12/23 1548)    ED Course/ Medical Decision Making/ A&P                                 Medical Decision Making Amount and/or Complexity of Data Reviewed Labs: ordered.  Risk Prescription drug management.   Patient presents to the ED for concern of abdominal pain, nausea, diarrhea, this involves an extensive number of treatment options, and is a complaint that carries with it a high risk of complications and morbidity.  The differential diagnosis includes intra-abdominal pathology, diverticulitis, pancreatitis, appendicitis, colitis, functional diarrhea, viral gastroenteritis, bacterial gastroenteritis, C. difficile   Co morbidities that complicate the patient evaluation  See hpi   Additional history obtained:  Additional history obtained from Nursing and Outside Medical Records   External records from outside source obtained and reviewed including  Gastroenterology note from  10/09/2023 Similar complaints of epigastric and upper abdominal tenderness to palpation Has functional abdominal pain dating back to 2011 ED visit from 10/10/2023 with similar symptoms Potassium 2.2 at that time provided supplementation Discussed with GI who recommended close GI follow-up and discharged with GI cocktail and Zofran    Lab Tests:  I Ordered, and personally interpreted labs.  The pertinent results include:   Potassium less than 2 Calcium  6.1 Protein 5 Albumin 2.7 Magnesium  1.4 Phosphorus 1.4    Cardiac Monitoring:  The patient was maintained on a cardiac monitor.  I personally viewed and interpreted the cardiac monitored which showed an underlying rhythm of: NSR with flattening of T waves in V leads   Medicines ordered and prescription drug management:  I ordered medication including potassium, calcium , zofran , morphine   for hypokalemia, hypocalcemia, nausea, abdominal pain Reevaluation of the patient after these medicines showed that the patient improved I have reviewed the patients home medicines and have made adjustments as needed    Consultations Obtained:  I requested consultation with hospitalist,  and discussed lab and imaging findings as well as pertinent plan - Dr. Barbra accept patient for admission   Problem List / ED Course:  Anxiety Takes 0.  2 5 mg Ativan this morning without much relief to anxiety.  Will provide 0.5 mg here in ED for anxiety as she is tearful as well as analgesia Abdominal pain Abdominal pain seems similar to pain experienced at recent GI and ED visit.  With improving WBC now within normal limits, do not feel that additional imaging is required at this time Attempted to get stool culture and C. difficile panel however there was not enough  stool to culture.  It also did not appear for smell like C. difficile subjectively Diarrhea Hypokalemia Hypocalcemia Hypomagnesia Hypophosphatemia Likely due to GI loss from multiple  episodes of diarrhea over past few weeks EKG showed some flattening T waves in V leads Will provide calcium  and potassium supplementation Will admit for continued supplementation and obs   Reevaluation:  After the interventions noted above, I reevaluated the patient and found that they have :improved   Dispostion:  After consideration of the diagnostic results and the patients response to treatment, I feel that the patent would benefit from admission for electrolyte supplementation and obs.   Discussed patient with Dr. Suzette who reviewed ED workup and agrees with plan Final Clinical Impression(s) / ED Diagnoses Final diagnoses:  Diarrhea, unspecified type  Hypokalemia due to excessive gastrointestinal loss of potassium  Hypocalcemia  Abdominal pain, unspecified abdominal location    Rx / DC Orders ED Discharge Orders     None         Minnie Tinnie BRAVO, PA 10/12/23 1706    Suzette Pac, MD 10/15/23 1106

## 2023-10-12 NOTE — ED Triage Notes (Signed)
 Pt bib EMS for abdominal pain,diarrhea and nausea. Seen here recently. Hx of IBS and anxiety. EMS gave 4mg  of zofran  IV.

## 2023-10-12 NOTE — Progress Notes (Addendum)
 Contacted and clarified if stool is needed for testing . Per On call MD. " Admission Doc' must have had his reason(s) for not ordering that. GI was already consulte " Stool discarded . Multiple stools, no orders for contact precautions or stool testing

## 2023-10-12 NOTE — Telephone Encounter (Signed)
 Chief Complaint: panic attack  Symptoms: heavy breathing, scared Frequency: constant for a few hours Pertinent Negatives: Patient denies  Disposition: [x] ED /[] Urgent Owens (no appt availability in office) / [] Appointment(In office/virtual)/ []  Alicia Owens/ [] Home Owens/ [] Refused Recommended Disposition /[] [place] [facility]/ []  Follow-up with PCP Additional Notes: Pt calling with severe anxiety/panic attack. Pt sitting in closet floor crying for a few hours this morning.  Pt states she ws seen in ED on 2/5 with stomach issues. Pt feels the trigger of this attack is stemming from the unknown cause of stomach pain. Pain is 6/10.  Pt states she was placed back on Celexa  on Wednesday after stopping in 08/2023.  Medication is taken nightly. Pt states, I'm so scared. I don't want to hurt myself, but how do I not hurt anymore? RN called 911. Husband was notified at work by [employer] and husband left work. Pt has two children (17 and 12) at home. When husband walked in door, pt got more upset. Crying intensified and breathing sped up. RN reminded pt  that RN still here with her until EMS arrives. Pt says she has some chest pain but unsure if from panic attack. Pt feels dizzy and lightheaded. Pt advised to sit down. Pt is trying to breath slowly out. Pt is breathing fast and currently crying.RN reassured pt she will be taken Owens and she is not alone. RN stayed on phone until EMS arrived.               Copied from CRM [id number]. Topic: Clinical - Red Word Triage >> Oct 12, 2023 10:12 AM Alicia Owens wrote: Red Word that prompted transfer to Nurse Triage: Very depressed; bad panic attacks; bad thoughts Reason for Disposition  SEVERE difficulty breathing (e.g., struggling for each breath, speaks in single words)  Answer Assessment - Initial Assessment Questions 1. CONCERN: Did anything happen that prompted you to call today?      Stomach pain 2. ANXIETY SYMPTOMS: Can you describe how you  (your loved one; patient) have been feeling? (e.g., tense, restless, panicky, anxious, keyed up, overwhelmed, sense of impending doom).      Building for a couple of week- but this morning is way worse 3. ONSET: How long have you been feeling this way? (e.g., hours, days, weeks)     Hours  4. SEVERITY: How would you rate the level of anxiety? (e.g., 0 - 10; or mild, moderate, severe).     Severe  5. FUNCTIONAL IMPAIRMENT: How have these feelings affected your ability to do daily activities? Have you had more difficulty than usual doing your normal daily activities? (e.g., getting better, same, worse; self-Owens, school, work, interactions)     Yes, siting in floor crying  6. HISTORY: Have you felt this way before? Have you ever been diagnosed with an anxiety problem in the past? (e.g., generalized anxiety disorder, panic attacks, PTSD). If Yes, ask: How was this problem treated? (e.g., medicines, counseling, etc.)     Yes, but not this bad 7. RISK OF HARM - SUICIDAL IDEATION: Do you ever have thoughts of hurting or killing yourself? If Yes, ask:  Do you have these feelings now? Do you have a plan on how you would do this?     How can I make this go away? I don't want to hurt myself 8. TREATMENT:  What has been done so far to treat this anxiety? (e.g., medicines, relaxation strategies). What has helped?     Pt started Celexa   9. TREATMENT - THERAPIST: Do you have a counselor or therapist? Name?     unsure 10. POTENTIAL TRIGGERS: Do you drink caffeinated beverages (e.g., coffee, colas, teas), and how much daily? Do you drink alcohol or use any drugs? Have you started any new medicines recently?       Stomach pain 11. PATIENT SUPPORT: Who is with you now? Who do you live with? Do you have family or friends who you can talk to?        17 and 12 yr children at home. Pt hiding in closet 12. OTHER SYMPTOMS: Do you have any other symptoms? (e.g., feeling  depressed, trouble concentrating, trouble sleeping, trouble breathing, palpitations or fast heartbeat, chest pain, sweating, nausea, or diarrhea)       Depressed, anxiety/ panic attacks  Protocols used: Anxiety and Panic Attack-A-AH

## 2023-10-12 NOTE — ED Notes (Signed)
 Pt profusely crying about medications. RN requested MD to chat with her,

## 2023-10-12 NOTE — H&P (Signed)
 History and Physical    Patient: Alicia Owens FMW:[id number] DOB: Feb 26, 1976 DOA: 10/12/2023 DOS: the patient was seen and examined on 10/12/2023 PCP: Alphonsa Glendia LABOR, MD  Patient coming from: Home  Chief Complaint:  Chief Complaint  Patient presents with   Abdominal Pain    Pt bib EMS for abdominal pain,diarrhea and nausea. Seen here recently. Hx of IBS and anxiety. EMS gave 4mg  of zofran  IV.   HPI: Alicia Owens is a 48 y.o. female with medical history significant of Hypertension, IBS, GERD, history of gastroparesis.  Patient has been having generalized abdominal pain, nausea, diarrhea for the last few weeks that has been worse over the last 4 to 5 days.  Pain is mostly in the epigastric and right upper quadrant areas.  She was seen by the nurse practitioner at the GI office who ordered a CT scan and some blood work.  The CT was negative for acute process but was recommended to come to the emergency department for evaluation due to her white blood cell count being 15.3.  She was evaluated in the emergency department on 2/5 and was noted to have a potassium of 2.2.  She had oral and IV potassium and a repeat potassium was 3.7.  She was discharged to home with strict outpatient follow-up.  She returns today due to the same complaints.  Her abdominal pain is the same as previous without much change.  She continues to feel weak.  She has decreased appetite but no fevers, chills.  No new medicines.  She is on hydrochlorothiazide .  Review of Systems: As mentioned in the history of present illness. All other systems reviewed and are negative. Past Medical History:  Diagnosis Date   Anemia    Anxiety    Chronic abdominal pain    Depression    Gastroparesis    GERD (gastroesophageal reflux disease)    Headache(784.0)    History of abdominal hysterectomy 09/03/2022   Ovaries removed as well.  04/10/2022-Dr. Aleene Boos [place]    Hypertension    Hyperthyroidism 2011   IBS (irritable  bowel syndrome)    Iron deficiency anemia, unspecified 10/05/2019   More than likely this is due to heavy cycles.  Patient has ongoing history of heavy cycles.  IFBOT - January 2021   Menorrhagia 10/05/2019   PONV (postoperative nausea and vomiting)    PP care - C/S 12/17 08/22/2011   Past Surgical History:  Procedure Laterality Date   APPENDECTOMY     BASAL CELL CARCINOMA EXCISION  02/2006   face   BREAST SURGERY     Breast reduction   CESAREAN SECTION  2007 and 2012   x2   CHOLECYSTECTOMY  12/15/2011   Procedure: LAPAROSCOPIC CHOLECYSTECTOMY;  Surgeon: Oneil LABOR Budge, MD;  Location: [facility];  Service: General;  Laterality: N/A;   COLONOSCOPY     ESOPHAGOGASTRODUODENOSCOPY     TUBAL LIGATION     with last c-section   WISDOM TOOTH EXTRACTION  08/2003   Social History:  reports that she has never smoked. She has never used smokeless tobacco. She reports that she does not drink alcohol and does not use drugs.  Allergies  Allergen Reactions   Ciprofloxacin      Severe nausea and vomiting when combined with flagyl  but not by itself   Codeine     REACTION: GI Upset, Vomiting   Flagyl  [Metronidazole ]     Severe nausea and vomiting   Promethazine Hcl     REACTION: Throat  felt strange   Reglan  [Metoclopramide ]     twitching   Sulfonamide Derivatives     REACTION: Rash    Family History  Problem Relation Age of Onset   Healthy Daughter    Healthy Son    Hypertension Mother    Hypertension Father    Hyperlipidemia Father    Hyperlipidemia Other    Hypertension Other    Anesthesia problems Neg Hx    Hypotension Neg Hx    Malignant hyperthermia Neg Hx    Pseudochol deficiency Neg Hx     Prior to Admission medications   Medication Sig Start Date End Date Taking? Authorizing Provider  acetaminophen  (TYLENOL ) 500 MG tablet Take 500-1,000 mg by mouth every 6 (six) hours as needed for moderate pain (pain score 4-6).   Yes [provider]  ALPRAZolam  (XANAX ) 0.25 MG  tablet TAKE ONE TABLET BY MOUTH TWICE DAILY AS NEEDED 06/04/23  Yes Luking, Glendia LABOR, MD  budesonide  (PULMICORT  FLEXHALER) 180 MCG/ACT inhaler Inhale 1 puff into the lungs in the morning and at bedtime. Patient taking differently: Inhale 1-2 puffs into the lungs daily. 09/07/23  Yes Mauro Elveria BROCKS, NP  busPIRone  (BUSPAR ) 15 MG tablet TAKE ONE TABLET BY MOUTH TWICE A DAY 10/11/23  Yes Luking, Glendia LABOR, MD  citalopram  (CELEXA ) 10 MG tablet Take 1 tablet (10 mg total) by mouth daily. Patient taking differently: Take 10 mg by mouth at bedtime. 10/09/23  Yes Luking, Glendia LABOR, MD  CRANBERRY PO Take 1 tablet by mouth daily.   Yes [provider]  cyclobenzaprine  (FLEXERIL ) 10 MG tablet TAKE ONE (1) TABLET BY MOUTH 3 TIMES DAILY AS NEEDED (BETWEEN MEALS AND AT BEDTIME) 07/16/23  Yes Luking, Scott A, MD  dicyclomine  (BENTYL ) 20 MG tablet TAKE ONE TABLET BY MOUTH THREE TIMES DAILY AS NEEDED OR AS DIRECTED 12/05/22  Yes Luking, Glendia LABOR, MD  estradiol  (VIVELLE -DOT) 0.1 MG/24HR patch Place 1 patch (0.1 mg total) onto the skin 2 (two) times a week. 06/04/23  Yes Mauro Elveria BROCKS, NP  Ferrous Sulfate (IRON PO) Take 1 tablet by mouth daily.   Yes [provider]  fexofenadine (ALLEGRA) 180 MG tablet Take 180 mg by mouth daily.   Yes [provider]  FIBER PO Take 1 Dose by mouth daily.   Yes [provider]  gabapentin  (NEURONTIN ) 100 MG capsule 2 qhs 03/06/23  Yes Luking, Scott A, MD  hydrochlorothiazide  (HYDRODIURIL ) 25 MG tablet TAKE ONE TABLET BY MOUTH EVERY MORNING Patient taking differently: Take 25 mg by mouth at bedtime. 09/03/23  Yes Alphonsa Glendia LABOR, MD  Lactobacillus-Inulin (CULTURELLE DIGESTIVE HEALTH) CAPS Take 1 capsule by mouth daily.   Yes [provider]  montelukast  (SINGULAIR ) 10 MG tablet TAKE ONE (1) TABLET BY MOUTH EVERY DAY Patient taking differently: Take 10 mg by mouth at bedtime. 05/31/23  Yes Alphonsa Glendia LABOR, MD  Multiple Vitamins-Minerals (MULTIVITAMIN  WITH MINERALS) tablet Take 1 tablet by mouth daily.   Yes [provider]  ondansetron  (ZOFRAN ) 4 MG tablet Take 1 tablet (4 mg total) by mouth every 8 (eight) hours as needed for nausea or vomiting. 10/10/23  Yes Keith, Kayla N, PA-C  pantoprazole  (PROTONIX ) 40 MG tablet TAKE ONE TABLET BY MOUTH TWICE A DAY 10/11/23  Yes Carlan, Chelsea L, NP  potassium chloride  SA (KLOR-CON  M) 20 MEQ tablet Take 1 tablet (20 mEq total) by mouth daily. 10/11/23  Yes Alphonsa Glendia LABOR, MD  sucralfate  (CARAFATE ) 1 g tablet Take 1 tablet (  1 g total) by mouth 4 (four) times daily -  with meals and at bedtime. Dissolve tablet in 1/2 of water, mix and drink slurry. 10/10/23  Yes Carlan, Chelsea L, NP  SUMAtriptan  (IMITREX ) 100 MG tablet May repeat in 2 hours if headache persists or recurs. Patient taking differently: Take 100 mg by mouth every 2 (two) hours as needed for headache or migraine. 03/06/23  Yes Luking, Glendia LABOR, MD  SUPER B COMPLEX/C PO Take 1 tablet by mouth daily.    Yes [provider]  topiramate  (TOPAMAX ) 50 MG tablet TAKE 3 TABLETS BY MOUTH IN THE EVENING 09/03/23  Yes Luking, Glendia LABOR, MD  Triamcinolone Acetonide (NASACORT AQ NA) Place 1 spray into the nose 2 (two) times daily as needed (Rhinitis).   Yes [provider]  TURMERIC PO Take 1 capsule by mouth daily.    Yes [provider]  Ubrogepant  (UBRELVY ) 50 MG TABS Take one tab po at onset of migraine; may repeat dose x 1 after 2 hours if needed 05/30/23  Yes Mauro Coy C, NP  VITAMIN D , CHOLECALCIFEROL, PO Take 1 tablet by mouth daily.   Yes [provider]  GI Cocktail (alum & mag hydroxide-simethicone /lidocaine )oral mixture Take 30 mLs by mouth 2 (two) times daily as needed. Patient not taking: Reported on 10/12/2023 10/10/23   Francis Ileana SAILOR, PA-C    Physical Exam: Vitals:   10/12/23 1445 10/12/23 1500 10/12/23 1515 10/12/23 1547  BP: 127/79 (!) 122/90 126/84   Pulse: 73 77 78   Resp: 16 16 17    Temp:     97.6 F (36.4 C)  TempSrc:    Oral  SpO2: 95% 96% 96%   Weight:      Height:       General: Middle-age female. Awake and alert and oriented x3. No acute cardiopulmonary distress.  HEENT: Normocephalic atraumatic.  Right and left ears normal in appearance.  Pupils equal, round, reactive to light. Extraocular muscles are intact. Sclerae anicteric and noninjected.  Moist mucosal membranes. No mucosal lesions.  Neck: Neck supple without lymphadenopathy. No carotid bruits. No masses palpated.  Cardiovascular: Regular rate with normal S1-S2 sounds. No murmurs, rubs, gallops auscultated. No JVD.  Respiratory: Good respiratory effort with no wheezes, rales, rhonchi. Lungs clear to auscultation bilaterally.  No accessory muscle use. Abdomen: Soft, tender in the epigastric and right upper quadrant areas, nondistended.  Hyperactive bowel sounds. No masses or hepatosplenomegaly  Skin: No rashes, lesions, or ulcerations.  Dry, warm to touch. 2+ dorsalis pedis and radial pulses. Musculoskeletal: No calf or leg pain. All major joints not erythematous nontender.  No upper or lower joint deformation.  Good ROM.  No contractures  Psychiatric: Intact judgment and insight. Pleasant and cooperative. Neurologic: No focal neurological deficits. Strength is 5/5 and symmetric in upper and lower extremities.  Cranial nerves II through XII are grossly intact.  Data Reviewed: Results for orders placed or performed during the hospital encounter of 10/12/23 (from the past 24 hours)  CBC with Differential     Status: Abnormal   Collection Time: 10/12/23  1:15 PM  Result Value Ref Range   WBC 9.4 4.0 - 10.5 K/uL   RBC 3.92 3.87 - 5.11 MIL/uL   Hemoglobin 12.5 12.0 - 15.0 g/dL   HCT 64.4 (L) 63.9 - 53.9 %   MCV 90.6 80.0 - 100.0 fL   MCH 31.9 26.0 - 34.0 pg   MCHC 35.2 30.0 - 36.0 g/dL   RDW 88.2 88.4 -  15.5 %   Platelets 330 150 - 400 K/uL   nRBC 0.0 0.0 - 0.2 %   Neutrophils Relative % 74 %   Neutro Abs 7.0 1.7 -  7.7 K/uL   Lymphocytes Relative 19 %   Lymphs Abs 1.8 0.7 - 4.0 K/uL   Monocytes Relative 6 %   Monocytes Absolute 0.5 0.1 - 1.0 K/uL   Eosinophils Relative 0 %   Eosinophils Absolute 0.0 0.0 - 0.5 K/uL   Basophils Relative 0 %   Basophils Absolute 0.0 0.0 - 0.1 K/uL   Immature Granulocytes 1 %   Abs Immature Granulocytes 0.05 0.00 - 0.07 K/uL  Comprehensive metabolic panel     Status: Abnormal   Collection Time: 10/12/23  1:15 PM  Result Value Ref Range   Sodium 135 135 - 145 mmol/L   Potassium <2.0 (LL) 3.5 - 5.1 mmol/L   Chloride 108 98 - 111 mmol/L   CO2 18 (L) 22 - 32 mmol/L   Glucose, Bld 90 70 - 99 mg/dL   BUN <5 (L) 6 - 20 mg/dL   Creatinine, Ser 9.52 0.44 - 1.00 mg/dL   Calcium  6.1 (LL) 8.9 - 10.3 mg/dL   Total Protein 5.0 (L) 6.5 - 8.1 g/dL   Albumin 2.7 (L) 3.5 - 5.0 g/dL   AST 21 15 - 41 U/L   ALT 28 0 - 44 U/L   Alkaline Phosphatase 48 38 - 126 U/L   Total Bilirubin 0.4 0.0 - 1.2 mg/dL   GFR, Estimated >39 >39 mL/min   Anion gap 9 5 - 15  Lipase, blood     Status: None   Collection Time: 10/12/23  1:15 PM  Result Value Ref Range   Lipase 28 11 - 51 U/L  Urinalysis, Routine w reflex microscopic -Urine, Clean Catch     Status: Abnormal   Collection Time: 10/12/23  1:15 PM  Result Value Ref Range   Color, Urine STRAW (A) YELLOW   APPearance CLEAR CLEAR   Specific Gravity, Urine 1.004 (L) 1.005 - 1.030   pH 7.0 5.0 - 8.0   Glucose, UA NEGATIVE NEGATIVE mg/dL   Hgb urine dipstick NEGATIVE NEGATIVE   Bilirubin Urine NEGATIVE NEGATIVE   Ketones, ur 20 (A) NEGATIVE mg/dL   Protein, ur NEGATIVE NEGATIVE mg/dL   Nitrite NEGATIVE NEGATIVE   Leukocytes,Ua NEGATIVE NEGATIVE  hCG, serum, qualitative     Status: None   Collection Time: 10/12/23  1:15 PM  Result Value Ref Range   Preg, Serum NEGATIVE NEGATIVE  Magnesium      Status: Abnormal   Collection Time: 10/12/23  1:15 PM  Result Value Ref Range   Magnesium  1.4 (L) 1.7 - 2.4 mg/dL  Phosphorus     Status:  Abnormal   Collection Time: 10/12/23  1:15 PM  Result Value Ref Range   Phosphorus 1.4 (L) 2.5 - 4.6 mg/dL    No results found.   Assessment and Plan: No notes have been filed under this hospital service. Service: Hospitalist  Principal Problem:   Hypokalemia Active Problems:   ANXIETY DEPRESSION   GERD   Hypertension   Abdominal pain, epigastric  Severe hypokalemia Stepdown admission Replace potassium Check potassium this evening and tomorrow morning Stop HCTZ as this may be contributing to the hypokalemia Abdominal pain Check ultrasound in the morning I do not feel that a CT would be beneficial as she had a recent CT and her pain is about the same as previous Diarrhea GI consult Imodium  Stool  culture Hypertension Will stop HCTZ as this may be contributing to the patient's hypokalemia Will change the patient to Cozaar  Anxiety depression Per EDP note, patient did have some thoughts of self-harm this morning.  She is currently not having any suicide ideation. She did recently start Celexa , which she has been on in the past.  Will hold this for now until she is medically stabilized   Advance Care Planning:   Code Status: Full Code confirmed by patient  Consults: GI  Family Communication: Husband present during interview and exam  Severity of Illness: The appropriate patient status for this patient is INPATIENT. Inpatient status is judged to be reasonable and necessary in order to provide the required intensity of service to ensure the patient's safety. The patient's presenting symptoms, physical exam findings, and initial radiographic and laboratory data in the context of their chronic comorbidities is felt to place them at high risk for further clinical deterioration. Furthermore, it is not anticipated that the patient will be medically stable for discharge from the hospital within 2 midnights of admission.   * I certify that at the point of admission it is my clinical  judgment that the patient will require inpatient hospital care spanning beyond 2 midnights from the point of admission due to high intensity of service, high risk for further deterioration and high frequency of surveillance required.*  Author: Shonte Beutler J Keasia Dubose, DO 10/12/2023 5:23 PM  For on call review [email or web address].

## 2023-10-12 NOTE — Telephone Encounter (Signed)
 Nurses You may share this message with Francenia  In addition to the previous test that I ordered through the telephone message from last evening please add the additional tests to include CRP, sed rate Stool test for C. difficile toxin A and B with reflex She also may submit a clean-catch urine when she goes for the blood work for urine culture  Diagnosis abdominal pain, leukocytosis  When these results come in and we can share these with not only the patient but her gastroenterologist group I would not recommend doing the blood work today lets target doing the blood work early next week to allow for possibly some of this inflammation to settle down  The likelihood of a serious underlying bacterial infection is low but nonetheless doing this lab work would be wise Also when gastroenterology does their scope test more than likely they would do biopsies of the stomach lining to look for any signs of inflammation  Nurses-please make sure that the labs ordered include what I have put into my telephone note from last evening as well as this message

## 2023-10-12 NOTE — Progress Notes (Signed)
 Uncontrolled anxiety. Patient with episodes of anxiousness, tearfulness, and states she has a sense of  impending doom. She denies SI and HI. PRN given appears a little calmer but continues to be anxious

## 2023-10-13 ENCOUNTER — Inpatient Hospital Stay (HOSPITAL_COMMUNITY): Payer: 59

## 2023-10-13 DIAGNOSIS — R110 Nausea: Secondary | ICD-10-CM | POA: Diagnosis not present

## 2023-10-13 DIAGNOSIS — R109 Unspecified abdominal pain: Secondary | ICD-10-CM | POA: Diagnosis not present

## 2023-10-13 DIAGNOSIS — E876 Hypokalemia: Secondary | ICD-10-CM

## 2023-10-13 DIAGNOSIS — R197 Diarrhea, unspecified: Secondary | ICD-10-CM | POA: Diagnosis not present

## 2023-10-13 LAB — CBC
HCT: 39.8 % (ref 36.0–46.0)
Hemoglobin: 13.6 g/dL (ref 12.0–15.0)
MCH: 31.7 pg (ref 26.0–34.0)
MCHC: 34.2 g/dL (ref 30.0–36.0)
MCV: 92.8 fL (ref 80.0–100.0)
Platelets: 387 10*3/uL (ref 150–400)
RBC: 4.29 MIL/uL (ref 3.87–5.11)
RDW: 11.9 % (ref 11.5–15.5)
WBC: 12.6 10*3/uL — ABNORMAL HIGH (ref 4.0–10.5)
nRBC: 0 % (ref 0.0–0.2)

## 2023-10-13 LAB — C DIFFICILE QUICK SCREEN W PCR REFLEX
C Diff antigen: NEGATIVE
C Diff interpretation: NOT DETECTED
C Diff toxin: NEGATIVE

## 2023-10-13 LAB — BASIC METABOLIC PANEL
Anion gap: 10 (ref 5–15)
BUN: <5 mg/dL — ABNORMAL LOW (ref 6–20)
CO2: 22 mmol/L (ref 22–32)
Calcium: 8.7 mg/dL — ABNORMAL LOW (ref 8.9–10.3)
Chloride: 103 mmol/L (ref 98–111)
Creatinine, Ser: 0.61 mg/dL (ref 0.44–1.00)
GFR, Estimated: >60 mL/min (ref >60)
Glucose, Bld: 85 mg/dL (ref 70–99)
Potassium: 4.4 mmol/L (ref 3.5–5.1)
Sodium: 135 mmol/L (ref 135–145)

## 2023-10-13 LAB — T4, FREE: Free T4: 1.05 ng/dL (ref 0.61–1.12)

## 2023-10-13 LAB — TSH: TSH: 0.928 u[IU]/mL (ref 0.350–4.500)

## 2023-10-13 LAB — HIV ANTIBODY (ROUTINE TESTING W REFLEX): HIV Screen 4th Generation wRfx: NONREACTIVE

## 2023-10-13 MED ORDER — DICYCLOMINE HCL 10 MG PO CAPS
20.0000 mg | ORAL_CAPSULE | Freq: Three times a day (TID) | ORAL | Status: DC
  Administered 2023-10-13 – 2023-10-17 (×15): 20 mg via ORAL
  Filled 2023-10-13 (×16): qty 2

## 2023-10-13 MED ORDER — SACCHAROMYCES BOULARDII 250 MG PO CAPS
250.0000 mg | ORAL_CAPSULE | Freq: Two times a day (BID) | ORAL | Status: DC
  Administered 2023-10-13 – 2023-10-17 (×7): 250 mg via ORAL
  Filled 2023-10-13 (×8): qty 1

## 2023-10-13 MED ORDER — BUDESONIDE 0.25 MG/2ML IN SUSP
0.2500 mg | Freq: Every day | RESPIRATORY_TRACT | Status: DC
  Administered 2023-10-14 – 2023-10-17 (×4): 0.25 mg via RESPIRATORY_TRACT
  Filled 2023-10-13 (×4): qty 2

## 2023-10-13 NOTE — Plan of Care (Signed)

## 2023-10-13 NOTE — Progress Notes (Signed)
 PROGRESS NOTE    Alicia Owens  FMW:[id number] DOB: [birth year] DOA: 10/12/2023 PCP: Alphonsa Glendia LABOR, MD   Brief Narrative:    Alicia Owens is a 48 y.o. female with medical history significant of Hypertension, IBS, GERD, history of gastroparesis.  Patient has been having generalized abdominal pain, nausea, diarrhea for the last few weeks that has been worse over the last 4 to 5 days.  She was admitted for evaluation of abdominal pain by GI as well as noted severe hypokalemia which has now improved.  Assessment & Plan:   Principal Problem:   Hypokalemia Active Problems:   ANXIETY DEPRESSION   GERD   Hypertension   Abdominal pain, epigastric  Assessment and Plan:   Severe hypokalemia-improved Okay to transfer to telemetry Replace potassium Check potassium this evening and tomorrow morning Stop HCTZ as this may be contributing to the hypokalemia Abdominal pain Ultrasound with hepatic steatosis Further workup per GI pending Diarrhea GI consult Imodium  Stool culture Hypertension Will stop HCTZ as this may be contributing to the patient's hypokalemia Will change the patient to Cozaar  Anxiety depression Per EDP note, patient did have some thoughts of self-harm this morning.  She is currently not having any suicide ideation. She did recently start Celexa , which she has been on in the past.  Will hold this for now until she is medically stabilized    DVT prophylaxis:Lovenox  Code Status: Full Family Communication: Husband at bedside 2/8 Disposition Plan:  Status is: Inpatient Remains inpatient appropriate because: Need for IV medications.  Consultants:  GI  Procedures:  None  Antimicrobials:  None   Subjective: Patient seen and evaluated today with some ongoing nausea, but no vomiting.  She has tolerated some of her breakfast.  She continues to have some loose bowel movements.  Objective: Vitals:   10/13/23 1000 10/13/23 1015 10/13/23 1032 10/13/23 1127  BP:    136/77   Pulse: 77     Resp: (!) 21  17   Temp:    98.2 F (36.8 C)  TempSrc:    Oral  SpO2: 100% 100% 100%   Weight:      Height:        Intake/Output Summary (Last 24 hours) at 10/13/2023 1144 Last data filed at 10/13/2023 0600 Gross per 24 hour  Intake 2203.92 ml  Output 500 ml  Net 1703.92 ml   Filed Weights   10/12/23 1141  Weight: 69.7 kg    Examination:  General exam: Appears calm and comfortable  Respiratory system: Clear to auscultation. Respiratory effort normal. Cardiovascular system: S1 & S2 heard, RRR.  Gastrointestinal system: Abdomen is soft Central nervous system: Alert and awake Extremities: No edema Skin: No significant lesions noted Psychiatry: Flat affect.    Data Reviewed: I have personally reviewed following labs and imaging studies  CBC: Recent Labs  Lab 10/09/23 1507 10/10/23 1239 10/12/23 1315 10/13/23 0238  WBC 15.3* 12.3* 9.4 12.6*  NEUTROABS  --  9.3* 7.0  --   HGB 15.5 15.2* 12.5 13.6  HCT 44.8 42.2 35.5* 39.8  MCV 91.4 86.5 90.6 92.8  PLT 455* 433* 330 387   Basic Metabolic Panel: Recent Labs  Lab 10/09/23 1507 10/10/23 1239 10/10/23 1755 10/12/23 1315 10/13/23 0238  NA 130* 129* 130* 135 135  K 3.2* 2.2* 3.7 <2.0* 4.4  CL 91* 89* 96* 108 103  CO2 26 26 24  18* 22  GLUCOSE 105* 124* 102* 90 85  BUN 5* 5* 5* <5* <5*  CREATININE 0.68  0.68 0.69 0.47 0.61  CALCIUM  9.5 9.0 8.4* 6.1* 8.7*  MG  --  2.1  --  1.4*  --   PHOS  --   --   --  1.4*  --    GFR: Estimated Creatinine Clearance: 78.6 mL/min (by C-G formula based on SCr of 0.61 mg/dL). Liver Function Tests: Recent Labs  Lab 10/09/23 1507 10/10/23 1239 10/12/23 1315  AST 17 26 21   ALT 20 25 28   ALKPHOS  --  68 48  BILITOT 0.6 0.8 0.4  PROT 8.2* 8.1 5.0*  ALBUMIN  --  4.6 2.7*   Recent Labs  Lab 10/09/23 1507 10/10/23 1239 10/12/23 1315  LIPASE 25 29 28    No results for input(s): AMMONIA in the last 168 hours. Coagulation Profile: No results for  input(s): INR, PROTIME in the last 168 hours. Cardiac Enzymes: No results for input(s): CKTOTAL, CKMB, CKMBINDEX, TROPONINI in the last 168 hours. BNP (last 3 results) No results for input(s): PROBNP in the last 8760 hours. HbA1C: No results for input(s): HGBA1C in the last 72 hours. CBG: No results for input(s): GLUCAP in the last 168 hours. Lipid Profile: No results for input(s): CHOL, HDL, LDLCALC, TRIG, CHOLHDL, LDLDIRECT in the last 72 hours. Thyroid  Function Tests: No results for input(s): TSH, T4TOTAL, FREET4, T3FREE, THYROIDAB in the last 72 hours. Anemia Panel: No results for input(s): VITAMINB12, FOLATE, FERRITIN, TIBC, IRON, RETICCTPCT in the last 72 hours. Sepsis Labs: No results for input(s): PROCALCITON, LATICACIDVEN in the last 168 hours.  Recent Results (from the past 240 hours)  MRSA Next Gen by PCR, Nasal     Status: None   Collection Time: 10/12/23  8:31 PM   Specimen: Nasal Mucosa; Nasal Swab  Result Value Ref Range Status   MRSA by PCR Next Gen NOT DETECTED NOT DETECTED Final    Comment: (NOTE) The GeneXpert MRSA Assay (FDA approved for NASAL specimens only), is one component of a comprehensive MRSA colonization surveillance program. It is not intended to diagnose MRSA infection nor to guide or monitor treatment for MRSA infections. Test performance is not FDA approved in patients less than 39 years old. Performed at [facility], [address], [place], [place] [address]          Radiology Studies: US  Abdomen Limited RUQ (LIVER/GB) Result Date: 10/13/2023 CLINICAL DATA:  Right upper quadrant abdominal pain for 2 weeks EXAM: ULTRASOUND ABDOMEN LIMITED RIGHT UPPER QUADRANT COMPARISON:  CT abdomen pelvis 10/08/2013 FINDINGS: Gallbladder: Status post cholecystectomy Common bile duct: Diameter: 4 mm Liver: Parenchymal echogenicity: Homogeneously increased Contours: Normal Lesions: None Portal vein: Patent.   Hepatopetal flow Other: None. IMPRESSION: Diffuse increased echogenicity of the hepatic parenchyma is a nonspecific indicator of hepatocellular dysfunction, most commonly steatosis. Electronically Signed   By: Aliene Lloyd M.D.   On: 10/13/2023 10:18        Scheduled Meds:  budesonide   0.25 mg Inhalation BID   busPIRone   15 mg Oral BID   Chlorhexidine  Gluconate Cloth  6 each Topical Daily   dicyclomine   20 mg Oral TID AC & HS   enoxaparin  (LOVENOX ) injection  40 mg Subcutaneous Q24H   gabapentin   200 mg Oral QHS   loratadine   10 mg Oral Daily   losartan   25 mg Oral Daily   montelukast   10 mg Oral QHS   pantoprazole   40 mg Oral BID   potassium chloride   40 mEq Oral TID   sucralfate   1 g Oral TID WC & HS  topiramate   150 mg Oral QHS   Continuous Infusions:  lactated ringers  75 mL/hr at 10/13/23 0340     LOS: 1 day    Time spent: 55 minutes    Graham Doukas D Maree, DO [facility]  If 7PM-7AM, please contact night-coverage [email or web address] 10/13/2023, 11:44 AM

## 2023-10-13 NOTE — Progress Notes (Signed)
   10/13/23 0846  TOC Brief Assessment  Insurance and Status Reviewed  Patient has primary care physician Yes  Home environment has been reviewed From home, spouse  Prior level of function: Independent  Prior/Current Home Services No current home services  Readmission risk has been reviewed Yes  Transition of care needs no transition of care needs at this time     Transition of Care Department [facility]) has reviewed patient and no TOC needs have been identified at this time. We will continue to monitor patient advancement through interdisciplinary progression rounds. If new patient transition needs arise, please place a TOC consult.

## 2023-10-13 NOTE — Plan of Care (Signed)

## 2023-10-13 NOTE — Consult Note (Signed)
 Toribio Fortune, M.D. Gastroenterology & Hepatology                                           Patient Name: Alicia Owens Account #: @[email or web address] @   MRN: [id number] Admission Date: 10/12/2023 Date of Evaluation:  10/13/2023 Time of Evaluation: 9:10 AM   Referring Physician: Adron Fairly, DO  Chief Complaint: Abdominal pain, diarrhea, nausea  HPI:  This is a 48 y.o. female with history of anxiety, depression, gastroparesis, GERD, IBS, hypothyroidism, hypertension, iron-deficiency anemia, who came to the hospital after presenting worsening episodes of diarrhea, abdominal pain and nausea.  Patient reports that for the last month she has presented recurrent episodes of abdominal pain in her upper abdomen which have been intermittent but getting more severe through time, along with intermittent abdominal bloating.  She reports that she noted her stools were becoming more mushy since then.  Previously, she was having 3-4 bowel movements per day.  However, for the last week she has noted the frequency has increased to 6-8 bowel movements per day which are now watery without melena or hematochezia.  No fever or chills.  She has felt nauseated but has not been vomiting.  No recent changes in her medications.  Has not been taking NSAIDs.  States that she has been taking Gas-X and dicyclomine  intermittently with mild relief of her abdominal complaints.  Given persistent symptoms, she came to see our GI clinic 4 days ago.  Lipase was normal, had mild hypokalemia of 3.2 and sodium of 130,  CBC showed leukocytosis of 15,300.  CT abdomen and pelvis with contrast was performed.  Patient was given GI cocktail and was discharged home.    Given persistent symptoms the patient came again to the hospital yesterday.  Last Colonoscopy: 2010 normal  Last Endoscopy: 2011 Mild gastritis     2) Otherwise normal examination     s/p small bowl biopsies and CLO test     Was negative  We have attempted to schedule procedures  in the past but due to scheduling conflicts, she has an EGD and colonoscopy scheduled until 10/31/2023.  In the ED, she was HD stable and afebrile. Labs were remarkable for  Potassium less than 2.0, calcium  6.1, sodium 135, normal renal function with BUN less than 5, phosphorus 1.4, magnesium  1.4, AST 21, ALT 28, CBC within normal limits.   Past Medical History: SEE CHRONIC ISSSUES: Past Medical History:  Diagnosis Date   Anemia    Anxiety    Chronic abdominal pain    Depression    Gastroparesis    GERD (gastroesophageal reflux disease)    Headache(784.0)    History of abdominal hysterectomy 09/03/2022   Ovaries removed as well.  04/10/2022-Dr. Aleene Boos [place]    Hypertension    Hyperthyroidism 2011   IBS (irritable bowel syndrome)    Iron deficiency anemia, unspecified 10/05/2019   More than likely this is due to heavy cycles.  Patient has ongoing history of heavy cycles.  IFBOT - January 2021   Menorrhagia 10/05/2019   PONV (postoperative nausea and vomiting)    PP care - C/S 12/17 08/22/2011   Past Surgical History:  Past Surgical History:  Procedure Laterality Date   APPENDECTOMY     BASAL CELL CARCINOMA EXCISION  02/2006   face   BREAST SURGERY     Breast reduction   CESAREAN SECTION  2007 and 2012   x2   CHOLECYSTECTOMY  12/15/2011   Procedure: LAPAROSCOPIC CHOLECYSTECTOMY;  Surgeon: Oneil DELENA Budge, MD;  Location: [facility];  Service: General;  Laterality: N/A;   COLONOSCOPY     ESOPHAGOGASTRODUODENOSCOPY     TUBAL LIGATION     with last c-section   WISDOM TOOTH EXTRACTION  08/2003   Family History:  Family History  Problem Relation Age of Onset   Healthy Daughter    Healthy Son    Hypertension Mother    Hypertension Father    Hyperlipidemia Father    Hyperlipidemia Other    Hypertension Other    Anesthesia problems Neg Hx    Hypotension Neg Hx    Malignant hyperthermia Neg Hx    Pseudochol deficiency Neg Hx    Social History:  Social History    Tobacco Use   Smoking status: Never   Smokeless tobacco: Never  Vaping Use   Vaping status: Never Used  Substance Use Topics   Alcohol use: No   Drug use: No    Home Medications:  Prior to Admission medications   Medication Sig Start Date End Date Taking? Authorizing Provider  acetaminophen  (TYLENOL ) 500 MG tablet Take 500-1,000 mg by mouth every 6 (six) hours as needed for moderate pain (pain score 4-6).   Yes [provider]  ALPRAZolam  (XANAX ) 0.25 MG tablet TAKE ONE TABLET BY MOUTH TWICE DAILY AS NEEDED 06/04/23  Yes Luking, Glendia DELENA, MD  budesonide  (PULMICORT  FLEXHALER) 180 MCG/ACT inhaler Inhale 1 puff into the lungs in the morning and at bedtime. Patient taking differently: Inhale 1-2 puffs into the lungs daily. 09/07/23  Yes Mauro Elveria BROCKS, NP  busPIRone  (BUSPAR ) 15 MG tablet TAKE ONE TABLET BY MOUTH TWICE A DAY 10/11/23  Yes Alphonsa Glendia DELENA, MD  citalopram  (CELEXA ) 10 MG tablet Take 1 tablet (10 mg total) by mouth daily. Patient taking differently: Take 10 mg by mouth at bedtime. 10/09/23  Yes Luking, Glendia DELENA, MD  CRANBERRY PO Take 1 tablet by mouth daily.   Yes [provider]  cyclobenzaprine  (FLEXERIL ) 10 MG tablet TAKE ONE (1) TABLET BY MOUTH 3 TIMES DAILY AS NEEDED (BETWEEN MEALS AND AT BEDTIME) 07/16/23  Yes Luking, Scott A, MD  dicyclomine  (BENTYL ) 20 MG tablet TAKE ONE TABLET BY MOUTH THREE TIMES DAILY AS NEEDED OR AS DIRECTED 12/05/22  Yes Luking, Glendia DELENA, MD  estradiol  (VIVELLE -DOT) 0.1 MG/24HR patch Place 1 patch (0.1 mg total) onto the skin 2 (two) times a week. 06/04/23  Yes Mauro Elveria BROCKS, NP  Ferrous Sulfate (IRON PO) Take 1 tablet by mouth daily.   Yes [provider]  fexofenadine (ALLEGRA) 180 MG tablet Take 180 mg by mouth daily.   Yes [provider]  FIBER PO Take 1 Dose by mouth daily.   Yes [provider]  gabapentin  (NEURONTIN ) 100 MG capsule 2 qhs 03/06/23  Yes Luking, Scott A, MD  hydrochlorothiazide   (HYDRODIURIL ) 25 MG tablet TAKE ONE TABLET BY MOUTH EVERY MORNING Patient taking differently: Take 25 mg by mouth at bedtime. 09/03/23  Yes Alphonsa Glendia DELENA, MD  Lactobacillus-Inulin (CULTURELLE DIGESTIVE HEALTH) CAPS Take 1 capsule by mouth daily.   Yes [provider]  montelukast  (SINGULAIR ) 10 MG tablet TAKE ONE (1) TABLET BY MOUTH EVERY DAY Patient taking differently: Take 10 mg by mouth at bedtime. 05/31/23  Yes Alphonsa Glendia DELENA, MD  Multiple Vitamins-Minerals (MULTIVITAMIN WITH MINERALS) tablet Take 1 tablet by mouth daily.   Yes [provider]  ondansetron  (ZOFRAN ) 4 MG tablet Take 1 tablet (4 mg total) by mouth every 8 (eight) hours as needed for nausea or vomiting. 10/10/23  Yes Keith, Kayla N, PA-C  pantoprazole  (PROTONIX ) 40 MG tablet TAKE ONE TABLET BY MOUTH TWICE A DAY 10/11/23  Yes Carlan, Chelsea L, NP  potassium chloride  SA (KLOR-CON  M) 20 MEQ tablet Take 1 tablet (20 mEq total) by mouth daily. 10/11/23  Yes Alphonsa Glendia LABOR, MD  sucralfate  (CARAFATE ) 1 g tablet Take 1 tablet (1 g total) by mouth 4 (four) times daily -  with meals and at bedtime. Dissolve tablet in 1/2 of water, mix and drink slurry. 10/10/23  Yes Carlan, Chelsea L, NP  SUMAtriptan  (IMITREX ) 100 MG tablet May repeat in 2 hours if headache persists or recurs. Patient taking differently: Take 100 mg by mouth every 2 (two) hours as needed for headache or migraine. 03/06/23  Yes Luking, Glendia LABOR, MD  SUPER B COMPLEX/C PO Take 1 tablet by mouth daily.    Yes [provider]  topiramate  (TOPAMAX ) 50 MG tablet TAKE 3 TABLETS BY MOUTH IN THE EVENING 09/03/23  Yes Luking, Glendia LABOR, MD  Triamcinolone Acetonide (NASACORT AQ NA) Place 1 spray into the nose 2 (two) times daily as needed (Rhinitis).   Yes [provider]  TURMERIC PO Take 1 capsule by mouth daily.    Yes [provider]  Ubrogepant  (UBRELVY ) 50 MG TABS Take one tab po at onset of migraine; may repeat dose x 1 after 2 hours if needed  05/30/23  Yes Mauro Coy C, NP  VITAMIN D , CHOLECALCIFEROL, PO Take 1 tablet by mouth daily.   Yes [provider]  GI Cocktail (alum & mag hydroxide-simethicone /lidocaine )oral mixture Take 30 mLs by mouth 2 (two) times daily as needed. Patient not taking: Reported on 10/12/2023 10/10/23   Keith, Kayla N, PA-C    Inpatient Medications:  Current Facility-Administered Medications:    ALPRAZolam  (XANAX ) tablet 0.25 mg, 0.25 mg, Oral, BID PRN, Stinson, Jacob J, DO, 0.25 mg at 10/12/23 2044   alum & mag hydroxide-simeth (MAALOX/MYLANTA) 200-200-20 MG/5ML suspension 30 mL, 30 mL, Oral, Q4H PRN, Adefeso, Oladapo, DO, 30 mL at 10/12/23 2305   budesonide  (PULMICORT ) nebulizer solution 0.25 mg, 0.25 mg, Inhalation, BID, Stinson, Jacob J, DO   busPIRone  (BUSPAR ) tablet 15 mg, 15 mg, Oral, BID, Stinson, Jacob J, DO, 15 mg at 10/12/23 2048   Chlorhexidine  Gluconate Cloth 2 % PADS 6 each, 6 each, Topical, Daily, Stinson, Jacob J, DO, 6 each at 10/12/23 2115   cyclobenzaprine  (FLEXERIL ) tablet 10 mg, 10 mg, Oral, TID PRN, Stinson, Jacob J, DO   dicyclomine  (BENTYL ) capsule 20 mg, 20 mg, Oral, TID AC & HS, Shah, Pratik D, DO, 20 mg at 10/13/23 0757   enoxaparin  (LOVENOX ) injection 40 mg, 40 mg, Subcutaneous, Q24H, Stinson, Jacob J, DO, 40 mg at 10/12/23 1805   gabapentin  (NEURONTIN ) capsule 200 mg, 200 mg, Oral, QHS, Stinson, Jacob J, DO, 200 mg at 10/12/23 2045   HYDROmorphone  (DILAUDID ) injection 0.5 mg, 0.5 mg, Intravenous, Q3H PRN, Stinson, Jacob J, DO, 0.5 mg at 10/13/23 0350   lactated ringers  infusion, , Intravenous, Continuous, Stinson, Jacob J, DO, Last Rate: 75 mL/hr at 10/13/23 0340, New Bag at 10/13/23 0340   loperamide  (IMODIUM ) capsule 2 mg, 2 mg, Oral, PRN, Stinson, Jacob J, DO, 2 mg at 10/13/23 9656   loratadine  (CLARITIN ) tablet 10 mg, 10 mg, Oral, Daily, Stinson, Jacob J, DO, 10 mg at  10/12/23 2047   losartan  (COZAAR ) tablet 25 mg, 25 mg, Oral, Daily, Stinson, Jacob J, DO, 25 mg at  10/12/23 2046   montelukast  (SINGULAIR ) tablet 10 mg, 10 mg, Oral, QHS, Stinson, Jacob J, DO, 10 mg at 10/12/23 2100   ondansetron  (ZOFRAN ) tablet 4 mg, 4 mg, Oral, Q6H PRN **OR** ondansetron  (ZOFRAN ) injection 4 mg, 4 mg, Intravenous, Q6H PRN, Stinson, Jacob J, DO, 4 mg at 10/13/23 0341   pantoprazole  (PROTONIX ) EC tablet 40 mg, 40 mg, Oral, BID, Stinson, Jacob J, DO, 40 mg at 10/12/23 2048   potassium chloride  SA (KLOR-CON  M) CR tablet 40 mEq, 40 mEq, Oral, TID, Stinson, Jacob J, DO, 40 mEq at 10/13/23 0006   sucralfate  (CARAFATE ) tablet 1 g, 1 g, Oral, TID WC & HS, Stinson, Jacob J, DO, 1 g at 10/13/23 9242   topiramate  (TOPAMAX ) tablet 150 mg, 150 mg, Oral, QHS, Stinson, Jacob J, DO, 150 mg at 10/12/23 2047 Allergies: Ciprofloxacin , Codeine, Flagyl  [metronidazole ], Promethazine hcl, Reglan  [metoclopramide ], and Sulfonamide derivatives  Complete Review of Systems: GENERAL: negative for malaise, night sweats HEENT: No changes in hearing or vision, no nose bleeds or other nasal problems. NECK: Negative for lumps, goiter, pain and significant neck swelling RESPIRATORY: Negative for cough, wheezing CARDIOVASCULAR: Negative for chest pain, leg swelling, palpitations, orthopnea GI: SEE HPI MUSCULOSKELETAL: Negative for joint pain or swelling, back pain, and muscle pain. SKIN: Negative for lesions, rash PSYCH: Negative for sleep disturbance, mood disorder and recent psychosocial stressors. HEMATOLOGY Negative for prolonged bleeding, bruising easily, and swollen nodes. ENDOCRINE: Negative for cold or heat intolerance, polyuria, polydipsia and goiter. NEURO: negative for tremor, gait imbalance, syncope and seizures. The remainder of the review of systems is noncontributory.  Physical Exam: BP (!) 168/94   Pulse 89   Temp 97.9 F (36.6 C) (Oral)   Resp 14   Ht 5' 1.5 (1.562 m)   Wt 69.7 kg   LMP 07/29/2021   SpO2 100%   BMI 28.55 kg/m  GENERAL: The patient is AO x3, in no acute  distress. HEENT: Head is normocephalic and atraumatic. EOMI are intact. Mouth is well hydrated and without lesions. NECK: Supple. No masses LUNGS: Clear to auscultation. No presence of rhonchi/wheezing/rales. Adequate chest expansion HEART: RRR, normal s1 and s2. ABDOMEN:   Tender to palpation in the upper abdomen, worse in the epigastric area, no guarding, no peritoneal signs, and nondistended. BS +. No masses. EXTREMITIES: Without any cyanosis, clubbing, rash, lesions or edema. NEUROLOGIC: AOx3, no focal motor deficit. SKIN: no jaundice, no rashes  Laboratory Data CBC:     Component Value Date/Time   WBC 12.6 (H) 10/13/2023 0238   RBC 4.29 10/13/2023 0238   HGB 13.6 10/13/2023 0238   HGB 14.4 08/30/2022 0806   HCT 39.8 10/13/2023 0238   HCT 42.9 08/30/2022 0806   PLT 387 10/13/2023 0238   PLT 338 08/30/2022 0806   MCV 92.8 10/13/2023 0238   MCV 91 08/30/2022 0806   MCH 31.7 10/13/2023 0238   MCHC 34.2 10/13/2023 0238   RDW 11.9 10/13/2023 0238   RDW 11.9 08/30/2022 0806   LYMPHSABS 1.8 10/12/2023 1315   LYMPHSABS 2.9 08/30/2022 0806   MONOABS 0.5 10/12/2023 1315   EOSABS 0.0 10/12/2023 1315   EOSABS 0.5 (H) 08/30/2022 0806   BASOSABS 0.0 10/12/2023 1315   BASOSABS 0.1 08/30/2022 0806   COAG: No results found for: INR, PROTIME  BMP:     Latest Ref Rng & Units 10/13/2023    2:38  AM 10/12/2023    1:15 PM 10/10/2023    5:55 PM  BMP  Glucose 70 - 99 mg/dL 85  90  897   BUN 6 - 20 mg/dL <5  <5  5   Creatinine 0.44 - 1.00 mg/dL 9.38  9.52  9.30   Sodium 135 - 145 mmol/L 135  135  130   Potassium 3.5 - 5.1 mmol/L 4.4  <2.0  3.7   Chloride 98 - 111 mmol/L 103  108  96   CO2 22 - 32 mmol/L 22  18  24    Calcium  8.9 - 10.3 mg/dL 8.7  6.1  8.4     HEPATIC:     Latest Ref Rng & Units 10/12/2023    1:15 PM 10/10/2023   12:39 PM 10/09/2023    3:07 PM  Hepatic Function  Total Protein 6.5 - 8.1 g/dL 5.0  8.1  8.2   Albumin 3.5 - 5.0 g/dL 2.7  4.6    AST 15 - 41 U/L 21  26  17     ALT 0 - 44 U/L 28  25  20    Alk Phosphatase 38 - 126 U/L 48  68    Total Bilirubin 0.0 - 1.2 mg/dL 0.4  0.8  0.6     CARDIAC: No results found for: CKTOTAL, CKMB, CKMBINDEX, TROPONINI   Imaging: I personally reviewed and interpreted the available imaging.  Assessment & Plan: LAKEESHA FONTANILLA is a 48 y.o. female with history of anxiety, depression, gastroparesis, GERD, IBS, hypothyroidism, hypertension, iron-deficiency anemia, who came to the hospital after presenting worsening episodes of diarrhea, abdominal pain and nausea.   patient has presented progressive symptoms of abdominal pain and changes in her bowel movements.  It is not clear what lead to progression of her symptoms, but she has presented significant electrolyte derangements including hypokalemia, hypomagnesemia, hypophosphatemia.  We will rule out gastrointestinal infection with to place testing today.  He will be also wanted to rule out adrenal insufficiency with a.m. cortisol.  Will also check for celiac disease  and  thyroid  disorders with serologies for now.    Ultimately, if investigations are negative, we will need to proceed with EGD and colonoscopy during the hospitalization.  Notably, no inflammatory changes or major abnormalities were found on cross-sectional abdominal imaging.  -  Follow-up C. difficile and GI pathogen panel - a.m. cortisol level - celiac disease panel, TSH, free T4 -May consider EGD and colonoscopy during the current hospitalization if workup is unremarkable  Toribio Fortune, MD Gastroenterology and Hepatology [facility]

## 2023-10-14 DIAGNOSIS — E876 Hypokalemia: Secondary | ICD-10-CM | POA: Diagnosis not present

## 2023-10-14 LAB — CORTISOL: Cortisol, Plasma: 10.9 ug/dL

## 2023-10-14 LAB — GASTROINTESTINAL PANEL BY PCR, STOOL (REPLACES STOOL CULTURE)

## 2023-10-14 LAB — CBC
HCT: 36.4 % (ref 36.0–46.0)
Hemoglobin: 12.3 g/dL (ref 12.0–15.0)
MCH: 32.3 pg (ref 26.0–34.0)
MCHC: 33.8 g/dL (ref 30.0–36.0)
MCV: 95.5 fL (ref 80.0–100.0)
Platelets: 331 10*3/uL (ref 150–400)
RBC: 3.81 MIL/uL — ABNORMAL LOW (ref 3.87–5.11)
RDW: 12.1 % (ref 11.5–15.5)
WBC: 9.7 10*3/uL (ref 4.0–10.5)
nRBC: 0 % (ref 0.0–0.2)

## 2023-10-14 LAB — MAGNESIUM: Magnesium: 2 mg/dL (ref 1.7–2.4)

## 2023-10-14 LAB — BASIC METABOLIC PANEL
Anion gap: 6 (ref 5–15)
BUN: <5 mg/dL — ABNORMAL LOW (ref 6–20)
CO2: 22 mmol/L (ref 22–32)
Calcium: 8.6 mg/dL — ABNORMAL LOW (ref 8.9–10.3)
Chloride: 109 mmol/L (ref 98–111)
Creatinine, Ser: 0.67 mg/dL (ref 0.44–1.00)
GFR, Estimated: >60 mL/min (ref >60)
Glucose, Bld: 86 mg/dL (ref 70–99)
Potassium: 4.8 mmol/L (ref 3.5–5.1)
Sodium: 137 mmol/L (ref 135–145)

## 2023-10-14 LAB — IGA: IgA: 234 mg/dL (ref 87–352)

## 2023-10-14 NOTE — Plan of Care (Signed)

## 2023-10-14 NOTE — Plan of Care (Signed)
  Problem: Clinical Measurements: Goal: Respiratory complications will improve Outcome: Progressing   Problem: Nutrition: Goal: Adequate nutrition will be maintained Outcome: Progressing   

## 2023-10-14 NOTE — Telephone Encounter (Signed)
 Able to follow along electronically thank you

## 2023-10-14 NOTE — Progress Notes (Signed)
 PROGRESS NOTE    Alicia Owens  FMW:[id number] DOB: 28-Feb-1976 DOA: 10/12/2023 PCP: Alphonsa Glendia LABOR, MD   Brief Narrative:    Alicia Owens is a 48 y.o. female with medical history significant of Hypertension, IBS, GERD, history of gastroparesis.  Patient has been having generalized abdominal pain, nausea, diarrhea for the last few weeks that has been worse over the last 4 to 5 days.  She was admitted for evaluation of abdominal pain by GI as well as noted severe hypokalemia which has now improved.  Assessment & Plan:   Principal Problem:   Hypokalemia Active Problems:   ANXIETY DEPRESSION   GERD   Hypertension   Abdominal pain, epigastric  Assessment and Plan:   Severe hypokalemia-improved Okay to transfer to telemetry Replace potassium Check potassium this evening and tomorrow morning Stop HCTZ as this may be contributing to the hypokalemia Abdominal pain Ultrasound with hepatic steatosis Further workup per GI pending Diarrhea GI consult with further workup pending Imodium  Stool culture Hypertension Will stop HCTZ as this may be contributing to the patient's hypokalemia Will change the patient to Cozaar  Anxiety depression Per EDP note, patient did have some thoughts of self-harm this morning.  She is currently not having any suicide ideation. She did recently start Celexa , which she has been on in the past.  Will hold this for now until she is medically stabilized    DVT prophylaxis:Lovenox  Code Status: Full Family Communication: Husband at bedside 2/9 Disposition Plan:  Status is: Inpatient Remains inpatient appropriate because: Need for IV medications.  Consultants:  GI  Procedures:  None  Antimicrobials:  None   Subjective: Patient seen and evaluated today with ongoing abdominal pain as well as nausea and diarrhea.  She does not feel improved.  Objective: Vitals:   10/13/23 1700 10/13/23 2116 10/14/23 0643 10/14/23 0749  BP: (!) 161/82 (!)  143/87 119/81   Pulse:  72 75   Resp: 19 20 20    Temp:  98 F (36.7 C) 97.6 F (36.4 C)   TempSrc:  Oral Oral   SpO2: 98% 99% 100% 99%  Weight:      Height:        Intake/Output Summary (Last 24 hours) at 10/14/2023 1215 Last data filed at 10/13/2023 1800 Gross per 24 hour  Intake 850.85 ml  Output --  Net 850.85 ml   Filed Weights   10/12/23 1141  Weight: 69.7 kg    Examination:  General exam: Appears calm and comfortable  Respiratory system: Clear to auscultation. Respiratory effort normal. Cardiovascular system: S1 & S2 heard, RRR.  Gastrointestinal system: Abdomen is soft Central nervous system: Alert and awake Extremities: No edema Skin: No significant lesions noted Psychiatry: Flat affect.    Data Reviewed: I have personally reviewed following labs and imaging studies  CBC: Recent Labs  Lab 10/09/23 1507 10/10/23 1239 10/12/23 1315 10/13/23 0238 10/14/23 0608  WBC 15.3* 12.3* 9.4 12.6* 9.7  NEUTROABS  --  9.3* 7.0  --   --   HGB 15.5 15.2* 12.5 13.6 12.3  HCT 44.8 42.2 35.5* 39.8 36.4  MCV 91.4 86.5 90.6 92.8 95.5  PLT 455* 433* 330 387 331   Basic Metabolic Panel: Recent Labs  Lab 10/10/23 1239 10/10/23 1755 10/12/23 1315 10/13/23 0238 10/14/23 0608  NA 129* 130* 135 135 137  K 2.2* 3.7 <2.0* 4.4 4.8  CL 89* 96* 108 103 109  CO2 26 24 18* 22 22  GLUCOSE 124* 102* 90 85 86  BUN 5* 5* <5* <5* <5*  CREATININE 0.68 0.69 0.47 0.61 0.67  CALCIUM  9.0 8.4* 6.1* 8.7* 8.6*  MG 2.1  --  1.4*  --  2.0  PHOS  --   --  1.4*  --   --    GFR: Estimated Creatinine Clearance: 78.6 mL/min (by C-G formula based on SCr of 0.67 mg/dL). Liver Function Tests: Recent Labs  Lab 10/09/23 1507 10/10/23 1239 10/12/23 1315  AST 17 26 21   ALT 20 25 28   ALKPHOS  --  68 48  BILITOT 0.6 0.8 0.4  PROT 8.2* 8.1 5.0*  ALBUMIN  --  4.6 2.7*   Recent Labs  Lab 10/09/23 1507 10/10/23 1239 10/12/23 1315  LIPASE 25 29 28    No results for input(s): AMMONIA in  the last 168 hours. Coagulation Profile: No results for input(s): INR, PROTIME in the last 168 hours. Cardiac Enzymes: No results for input(s): CKTOTAL, CKMB, CKMBINDEX, TROPONINI in the last 168 hours. BNP (last 3 results) No results for input(s): PROBNP in the last 8760 hours. HbA1C: No results for input(s): HGBA1C in the last 72 hours. CBG: No results for input(s): GLUCAP in the last 168 hours. Lipid Profile: No results for input(s): CHOL, HDL, LDLCALC, TRIG, CHOLHDL, LDLDIRECT in the last 72 hours. Thyroid  Function Tests: Recent Labs    10/13/23 1155  TSH 0.928  FREET4 1.05   Anemia Panel: No results for input(s): VITAMINB12, FOLATE, FERRITIN, TIBC, IRON, RETICCTPCT in the last 72 hours. Sepsis Labs: No results for input(s): PROCALCITON, LATICACIDVEN in the last 168 hours.  Recent Results (from the past 240 hours)  MRSA Next Gen by PCR, Nasal     Status: None   Collection Time: 10/12/23  8:31 PM   Specimen: Nasal Mucosa; Nasal Swab  Result Value Ref Range Status   MRSA by PCR Next Gen NOT DETECTED NOT DETECTED Final    Comment: (NOTE) The GeneXpert MRSA Assay (FDA approved for NASAL specimens only), is one component of a comprehensive MRSA colonization surveillance program. It is not intended to diagnose MRSA infection nor to guide or monitor treatment for MRSA infections. Test performance is not FDA approved in patients less than 64 years old. Performed at [facility], [address], [place], [place] [address]   Gastrointestinal Panel by PCR , Stool     Status: None   Collection Time: 10/13/23  6:58 AM   Specimen: Stool  Result Value Ref Range Status   Campylobacter species NOT DETECTED NOT DETECTED Final   Plesimonas shigelloides NOT DETECTED NOT DETECTED Final   Salmonella species NOT DETECTED NOT DETECTED Final   Yersinia enterocolitica NOT DETECTED NOT DETECTED Final   Vibrio species NOT DETECTED NOT DETECTED Final    Vibrio cholerae NOT DETECTED NOT DETECTED Final   Enteroaggregative E coli (EAEC) NOT DETECTED NOT DETECTED Final   Enteropathogenic E coli (EPEC) NOT DETECTED NOT DETECTED Final   Enterotoxigenic E coli (ETEC) NOT DETECTED NOT DETECTED Final   Shiga like toxin producing E coli (STEC) NOT DETECTED NOT DETECTED Final   Shigella/Enteroinvasive E coli (EIEC) NOT DETECTED NOT DETECTED Final   Cryptosporidium NOT DETECTED NOT DETECTED Final   Cyclospora cayetanensis NOT DETECTED NOT DETECTED Final   Entamoeba histolytica NOT DETECTED NOT DETECTED Final   Giardia lamblia NOT DETECTED NOT DETECTED Final   Adenovirus F40/41 NOT DETECTED NOT DETECTED Final   Astrovirus NOT DETECTED NOT DETECTED Final   Norovirus GI/GII NOT DETECTED NOT DETECTED Final   Rotavirus A NOT  DETECTED NOT DETECTED Final   Sapovirus (I, II, IV, and V) NOT DETECTED NOT DETECTED Final    Comment: Performed at [facility], [address] [place], [place] [address]  C Difficile Quick Screen w PCR reflex     Status: None   Collection Time: 10/13/23 11:05 AM   Specimen: STOOL  Result Value Ref Range Status   C Diff antigen NEGATIVE NEGATIVE Final   C Diff toxin NEGATIVE NEGATIVE Final   C Diff interpretation No C. difficile detected.  Final    Comment: Performed at [facility], [address], [place], [place] [address]         Radiology Studies: US  Abdomen Limited RUQ (LIVER/GB) Result Date: 10/13/2023 CLINICAL DATA:  Right upper quadrant abdominal pain for 2 weeks EXAM: ULTRASOUND ABDOMEN LIMITED RIGHT UPPER QUADRANT COMPARISON:  CT abdomen pelvis 10/08/2013 FINDINGS: Gallbladder: Status post cholecystectomy Common bile duct: Diameter: 4 mm Liver: Parenchymal echogenicity: Homogeneously increased Contours: Normal Lesions: None Portal vein: Patent.  Hepatopetal flow Other: None. IMPRESSION: Diffuse increased echogenicity of the hepatic parenchyma is a nonspecific indicator of hepatocellular dysfunction, most  commonly steatosis. Electronically Signed   By: Aliene Lloyd M.D.   On: 10/13/2023 10:18        Scheduled Meds:  budesonide   0.25 mg Inhalation Daily   busPIRone   15 mg Oral BID   Chlorhexidine  Gluconate Cloth  6 each Topical Daily   dicyclomine   20 mg Oral TID AC & HS   enoxaparin  (LOVENOX ) injection  40 mg Subcutaneous Q24H   gabapentin   200 mg Oral QHS   loratadine   10 mg Oral Daily   losartan   25 mg Oral Daily   montelukast   10 mg Oral QHS   pantoprazole   40 mg Oral BID   potassium chloride   40 mEq Oral TID   saccharomyces boulardii  250 mg Oral BID   sucralfate   1 g Oral TID WC & HS   topiramate   150 mg Oral QHS     LOS: 2 days    Time spent: 55 minutes    Rogen Porte JONETTA Fairly, DO [facility]  If 7PM-7AM, please contact night-coverage [email or web address] 10/14/2023, 12:15 PM

## 2023-10-15 ENCOUNTER — Telehealth (INDEPENDENT_AMBULATORY_CARE_PROVIDER_SITE_OTHER): Payer: Self-pay | Admitting: Gastroenterology

## 2023-10-15 DIAGNOSIS — R110 Nausea: Secondary | ICD-10-CM

## 2023-10-15 DIAGNOSIS — K760 Fatty (change of) liver, not elsewhere classified: Secondary | ICD-10-CM | POA: Diagnosis not present

## 2023-10-15 DIAGNOSIS — R197 Diarrhea, unspecified: Secondary | ICD-10-CM | POA: Diagnosis not present

## 2023-10-15 DIAGNOSIS — R109 Unspecified abdominal pain: Secondary | ICD-10-CM | POA: Diagnosis not present

## 2023-10-15 DIAGNOSIS — E876 Hypokalemia: Secondary | ICD-10-CM | POA: Diagnosis not present

## 2023-10-15 DIAGNOSIS — Z9049 Acquired absence of other specified parts of digestive tract: Secondary | ICD-10-CM

## 2023-10-15 LAB — CBC
HCT: 35.9 % — ABNORMAL LOW (ref 36.0–46.0)
Hemoglobin: 12 g/dL (ref 12.0–15.0)
MCH: 32.2 pg (ref 26.0–34.0)
MCHC: 33.4 g/dL (ref 30.0–36.0)
MCV: 96.2 fL (ref 80.0–100.0)
Platelets: 324 10*3/uL (ref 150–400)
RBC: 3.73 MIL/uL — ABNORMAL LOW (ref 3.87–5.11)
RDW: 12.2 % (ref 11.5–15.5)
WBC: 11.3 10*3/uL — ABNORMAL HIGH (ref 4.0–10.5)
nRBC: 0 % (ref 0.0–0.2)

## 2023-10-15 LAB — BASIC METABOLIC PANEL
Anion gap: 7 (ref 5–15)
BUN: 6 mg/dL (ref 6–20)
CO2: 22 mmol/L (ref 22–32)
Calcium: 8.8 mg/dL — ABNORMAL LOW (ref 8.9–10.3)
Chloride: 108 mmol/L (ref 98–111)
Creatinine, Ser: 0.71 mg/dL (ref 0.44–1.00)
GFR, Estimated: >60 mL/min (ref >60)
Glucose, Bld: 77 mg/dL (ref 70–99)
Potassium: 5 mmol/L (ref 3.5–5.1)
Sodium: 137 mmol/L (ref 135–145)

## 2023-10-15 LAB — TISSUE TRANSGLUTAMINASE, IGA: Tissue Transglutaminase Ab, IgA: <2 U/mL (ref 0–3)

## 2023-10-15 LAB — MAGNESIUM: Magnesium: 2.1 mg/dL (ref 1.7–2.4)

## 2023-10-15 MED ORDER — ENOXAPARIN SODIUM 40 MG/0.4ML IJ SOSY
40.0000 mg | PREFILLED_SYRINGE | INTRAMUSCULAR | Status: DC
  Administered 2023-10-16: 40 mg via SUBCUTANEOUS
  Filled 2023-10-15: qty 0.4

## 2023-10-15 MED ORDER — POLYETHYLENE GLYCOL 3350 17 G PO PACK
17.0000 g | PACK | ORAL | Status: AC
Start: 2023-10-15 — End: 2023-10-15
  Administered 2023-10-15 (×6): 17 g via ORAL
  Filled 2023-10-15 (×6): qty 1

## 2023-10-15 MED ORDER — CITALOPRAM HYDROBROMIDE 20 MG PO TABS
10.0000 mg | ORAL_TABLET | Freq: Every day | ORAL | Status: DC
  Filled 2023-10-15 (×3): qty 1

## 2023-10-15 MED ORDER — BISACODYL 5 MG PO TBEC
10.0000 mg | DELAYED_RELEASE_TABLET | Freq: Once | ORAL | Status: AC
  Administered 2023-10-15: 10 mg via ORAL
  Filled 2023-10-15: qty 2

## 2023-10-15 NOTE — Progress Notes (Addendum)
 Gastroenterology Progress Note     Patient ID: Alicia Owens; [id number]; 1976/03/17    Subjective   Diarrhea twice this morning, liquid. Urgency. Upper abdominal cramping and radiates to RUQ and lower abdomen down to LLQ. Abdominal pain consistently underlying and not relieved after BM.   Hx of IBS, GERD, but LLQ has been ongoing and intermittent but upper abdominal pain started about a month ago and progressed in intensity over the past few weeks. Last week got to the point that was debilitating. Decreased appetite over past few weeks. Yesterday was able to tolerate grits for breakfast, lunch deli [place] sandwich, dinner mashed potatoes, soft carrots, small amount of grilled chicken. No rectal bleeding.   Was not able to do liquid prep years ago for colonoscopy. Would like to do the pills.    Objective   Vital signs in last 24 hours Temp:  [98 F (36.7 C)-98.2 F (36.8 C)] 98 F (36.7 C) (02/10 0406) Pulse Rate:  [62-95] 62 (02/10 0406) Resp:  [13-18] 18 (02/10 0406) BP: (115-123)/(70-77) 115/70 (02/10 0406) SpO2:  [100 %] 100 % (02/10 0734) Last BM Date : 10/13/23  Physical Exam General:   Alert and oriented, pleasant, anxious Abdomen:  Bowel sounds present, soft, TTP RUQ, lower abdomen, LLQ, no rebound or guarding Neurologic:  Alert and  oriented x4   Intake/Output from previous day: 02/09 0701 - 02/10 0700 In: 120 [P.O.:120] Out: -  Intake/Output this shift: No intake/output data recorded.  Lab Results  Recent Labs    10/13/23 0238 10/14/23 0608 10/15/23 0249  WBC 12.6* 9.7 11.3*  HGB 13.6 12.3 12.0  HCT 39.8 36.4 35.9*  PLT 387 331 324   BMET Recent Labs    10/13/23 0238 10/14/23 0608 10/15/23 0249  NA 135 137 137  K 4.4 4.8 5.0  CL 103 109 108  CO2 22 22 22   GLUCOSE 85 86 77  BUN <5* <5* 6  CREATININE 0.61 0.67 0.71  CALCIUM  8.7* 8.6* 8.8*   LFT Recent Labs    10/12/23 1315  PROT 5.0*  ALBUMIN 2.7*  AST 21  ALT 28  ALKPHOS 48   BILITOT 0.4     Studies/Results US  Abdomen Limited RUQ (LIVER/GB) Result Date: 10/13/2023 CLINICAL DATA:  Right upper quadrant abdominal pain for 2 weeks EXAM: ULTRASOUND ABDOMEN LIMITED RIGHT UPPER QUADRANT COMPARISON:  CT abdomen pelvis 10/08/2013 FINDINGS: Gallbladder: Status post cholecystectomy Common bile duct: Diameter: 4 mm Liver: Parenchymal echogenicity: Homogeneously increased Contours: Normal Lesions: None Portal vein: Patent.  Hepatopetal flow Other: None. IMPRESSION: Diffuse increased echogenicity of the hepatic parenchyma is a nonspecific indicator of hepatocellular dysfunction, most commonly steatosis. Electronically Signed   By: Elester Grim M.D.   On: 10/13/2023 10:18   CT ABDOMEN PELVIS W CONTRAST Result Date: 10/09/2023 CLINICAL DATA:  Epigastric pain radiating to the back. EXAM: CT ABDOMEN AND PELVIS WITH CONTRAST TECHNIQUE: Multidetector CT imaging of the abdomen and pelvis was performed using the standard protocol following bolus administration of intravenous contrast. RADIATION DOSE REDUCTION: This exam was performed according to the departmental dose-optimization program which includes automated exposure control, adjustment of the mA and/or kV according to patient size and/or use of iterative reconstruction technique. CONTRAST:  OMNIPAQUE  IOHEXOL  300 MG/ML  SOLN COMPARISON:  05/12/2023 FINDINGS: Lower chest: Lung bases are clear. Hepatobiliary: No focal liver abnormality is seen. Status post cholecystectomy. No biliary dilatation. Pancreas: Unremarkable. No pancreatic ductal dilatation or surrounding inflammatory changes. Spleen: Normal in size without focal abnormality. Adrenals/Urinary  Tract: Adrenal glands are unremarkable. Kidneys are normal, without renal calculi, focal lesion, or hydronephrosis. Bladder is unremarkable. Stomach/Bowel: Stomach is within normal limits. Appendix appears surgically absent. No evidence of bowel wall thickening, distention, or inflammatory  changes. Vascular/Lymphatic: No significant vascular findings are present. No enlarged abdominal or pelvic lymph nodes. Reproductive: Status post hysterectomy. No adnexal masses. Other: No abdominal wall hernia or abnormality. No abdominopelvic ascites. Musculoskeletal: No acute or significant osseous findings. IMPRESSION: No acute process demonstrated in the abdomen or pelvis. Normal appearance of the pancreas. No evidence of acute pancreatitis. Electronically Signed   By: Boyce Byes M.D.   On: 10/09/2023 17:14    Assessment  48 y.o. female with a history of anxiety, depression, gastroparesis, GERD, IBS, hypothyroidism, hypertension, iron-deficiency anemia, presenting this admission with worsening diarrhea, abdominal pain, and nausea. Recent CT prior to admission without acute process. US  inpatient with absent gallbladder, normal CBD, hepatic steatosis.  Thus far, Cdiff and GIP negative. Fasting am cortisol normal. TSH and T4 normal. IgA normal; TTg, IgA pending. As she has not had improvement with supportive measures (PPI BID, dicyclomine  scheduled), will arrange colonoscopy/EGD for tomorrow with Dr. Sammi Crick. She is requesting a pill prep, which was discussed with Dr. Sammi Crick. However, this is not available on hospital formulary. Will do Miralax  prep. Suspect an underlying functional component.    Plan / Recommendations  Continue PPI BID Hold evening dose of Lovenox  Miralax  prep Clear liquids today, NPO after midnight Ileocolonoscopy and EGD tomorrow with Dr. Sammi Crick     LOS: 3 days    10/15/2023, 10:16 AM  Delman Ferns, PhD, ANP-BC [facility]

## 2023-10-15 NOTE — Telephone Encounter (Signed)
 Hi Alicia Owens, Please cancel her EGD and colonoscopy at the end of the month, we will do this tomorrow in house   Pt currently admitted to hospital. Message sent to Endo to cancel procedures for 10/31/23

## 2023-10-15 NOTE — Plan of Care (Signed)
   Problem: Education: Goal: Knowledge of General Education information will improve Description Including pain rating scale, medication(s)/side effects and non-pharmacologic comfort measures Outcome: Progressing   Problem: Health Behavior/Discharge Planning: Goal: Ability to manage health-related needs will improve Outcome: Progressing

## 2023-10-15 NOTE — Plan of Care (Signed)

## 2023-10-15 NOTE — Telephone Encounter (Signed)
 Patient currently being treated at the hospital

## 2023-10-15 NOTE — Progress Notes (Signed)
 PROGRESS NOTE    Alicia Owens  ZOX:[id number] DOB: September 28, 1975 DOA: 10/12/2023 PCP: Bennet Brasil, MD   Brief Narrative:    Alicia Owens is a 48 y.o. female with medical history significant of Hypertension, IBS, GERD, history of gastroparesis.  Patient has been having generalized abdominal pain, nausea, diarrhea for the last few weeks that has been worse over the last 4 to 5 days.  She was admitted for evaluation of abdominal pain by GI as well as noted severe hypokalemia which has now improved.  Assessment & Plan:   Principal Problem:   Hypokalemia Active Problems:   ANXIETY DEPRESSION   GERD   Hypertension   Abdominal pain, epigastric  Assessment and Plan:   Severe hypokalemia-resolved Hold further potassium supplementation Stop HCTZ as this may be contributing to the hypokalemia Abdominal pain Ultrasound with hepatic steatosis Further workup per GI pending Diarrhea GI consult with further workup pending with plans for EGD/colonoscopy 7 Imodium  Stool GI panel negative Hypertension Will stop HCTZ as this may be contributing to the patient's hypokalemia Will change the patient to Cozaar  Anxiety depression Per EDP note, patient did have some thoughts of self-harm this morning.  She is currently not having any suicide ideation. She did recently start Celexa , which she has been on in the past.  Can continue for now.    DVT prophylaxis:Lovenox  Code Status: Full Family Communication: Husband at bedside 2/10 Disposition Plan:  Status is: Inpatient Remains inpatient appropriate because: Need for IV medications.  Consultants:  GI  Procedures:  None  Antimicrobials:  None   Subjective: Patient seen and evaluated today with ongoing abdominal pain as well as nausea and diarrhea.  She does not feel improved.  Objective: Vitals:   10/14/23 1417 10/14/23 2033 10/15/23 0406 10/15/23 0734  BP: 123/76 123/77 115/70   Pulse: 95 68 62   Resp:  13 18   Temp: 98.1 F  (36.7 C) 98.2 F (36.8 C) 98 F (36.7 C)   TempSrc: Oral Oral Oral   SpO2: 100% 100% 100% 100%  Weight:      Height:        Intake/Output Summary (Last 24 hours) at 10/15/2023 1033 Last data filed at 10/14/2023 1400 Gross per 24 hour  Intake 120 ml  Output --  Net 120 ml   Filed Weights   10/12/23 1141  Weight: 69.7 kg    Examination:  General exam: Appears calm and comfortable  Respiratory system: Clear to auscultation. Respiratory effort normal. Cardiovascular system: S1 & S2 heard, RRR.  Gastrointestinal system: Abdomen is soft Central nervous system: Alert and awake Extremities: No edema Skin: No significant lesions noted Psychiatry: Flat affect.    Data Reviewed: I have personally reviewed following labs and imaging studies  CBC: Recent Labs  Lab 10/10/23 1239 10/12/23 1315 10/13/23 0238 10/14/23 0608 10/15/23 0249  WBC 12.3* 9.4 12.6* 9.7 11.3*  NEUTROABS 9.3* 7.0  --   --   --   HGB 15.2* 12.5 13.6 12.3 12.0  HCT 42.2 35.5* 39.8 36.4 35.9*  MCV 86.5 90.6 92.8 95.5 96.2  PLT 433* 330 387 331 324   Basic Metabolic Panel: Recent Labs  Lab 10/10/23 1239 10/10/23 1755 10/12/23 1315 10/13/23 0238 10/14/23 0608 10/15/23 0249  NA 129* 130* 135 135 137 137  K 2.2* 3.7 <2.0* 4.4 4.8 5.0  CL 89* 96* 108 103 109 108  CO2 26 24 18* 22 22 22   GLUCOSE 124* 102* 90 85 86 77  BUN  5* 5* <5* <5* <5* 6  CREATININE 0.68 0.69 0.47 0.61 0.67 0.71  CALCIUM  9.0 8.4* 6.1* 8.7* 8.6* 8.8*  MG 2.1  --  1.4*  --  2.0 2.1  PHOS  --   --  1.4*  --   --   --    GFR: Estimated Creatinine Clearance: 78.6 mL/min (by C-G formula based on SCr of 0.71 mg/dL). Liver Function Tests: Recent Labs  Lab 10/09/23 1507 10/10/23 1239 10/12/23 1315  AST 17 26 21   ALT 20 25 28   ALKPHOS  --  68 48  BILITOT 0.6 0.8 0.4  PROT 8.2* 8.1 5.0*  ALBUMIN  --  4.6 2.7*   Recent Labs  Lab 10/09/23 1507 10/10/23 1239 10/12/23 1315  LIPASE 25 29 28    No results for input(s):  "AMMONIA" in the last 168 hours. Coagulation Profile: No results for input(s): "INR", "PROTIME" in the last 168 hours. Cardiac Enzymes: No results for input(s): "CKTOTAL", "CKMB", "CKMBINDEX", "TROPONINI" in the last 168 hours. BNP (last 3 results) No results for input(s): "PROBNP" in the last 8760 hours. HbA1C: No results for input(s): "HGBA1C" in the last 72 hours. CBG: No results for input(s): "GLUCAP" in the last 168 hours. Lipid Profile: No results for input(s): "CHOL", "HDL", "LDLCALC", "TRIG", "CHOLHDL", "LDLDIRECT" in the last 72 hours. Thyroid  Function Tests: Recent Labs    10/13/23 1155  TSH 0.928  FREET4 1.05   Anemia Panel: No results for input(s): "VITAMINB12", "FOLATE", "FERRITIN", "TIBC", "IRON", "RETICCTPCT" in the last 72 hours. Sepsis Labs: No results for input(s): "PROCALCITON", "LATICACIDVEN" in the last 168 hours.  Recent Results (from the past 240 hours)  MRSA Next Gen by PCR, Nasal     Status: None   Collection Time: 10/12/23  8:31 PM   Specimen: Nasal Mucosa; Nasal Swab  Result Value Ref Range Status   MRSA by PCR Next Gen NOT DETECTED NOT DETECTED Final    Comment: (NOTE) The GeneXpert MRSA Assay (FDA approved for NASAL specimens only), is one component of a comprehensive MRSA colonization surveillance program. It is not intended to diagnose MRSA infection nor to guide or monitor treatment for MRSA infections. Test performance is not FDA approved in patients less than 28 years old. Performed at [facility], [address] [place], [place] [address]   Gastrointestinal Panel by PCR , Stool     Status: None   Collection Time: 10/13/23  6:58 AM   Specimen: Stool  Result Value Ref Range Status   Campylobacter species NOT DETECTED NOT DETECTED Final   Plesimonas shigelloides NOT DETECTED NOT DETECTED Final   Salmonella species NOT DETECTED NOT DETECTED Final   Yersinia enterocolitica NOT DETECTED NOT DETECTED Final   Vibrio species NOT DETECTED NOT  DETECTED Final   Vibrio cholerae NOT DETECTED NOT DETECTED Final   Enteroaggregative E coli (EAEC) NOT DETECTED NOT DETECTED Final   Enteropathogenic E coli (EPEC) NOT DETECTED NOT DETECTED Final   Enterotoxigenic E coli (ETEC) NOT DETECTED NOT DETECTED Final   Shiga like toxin producing E coli (STEC) NOT DETECTED NOT DETECTED Final   Shigella/Enteroinvasive E coli (EIEC) NOT DETECTED NOT DETECTED Final   Cryptosporidium NOT DETECTED NOT DETECTED Final   Cyclospora cayetanensis NOT DETECTED NOT DETECTED Final   Entamoeba histolytica NOT DETECTED NOT DETECTED Final   Giardia lamblia NOT DETECTED NOT DETECTED Final   Adenovirus F40/41 NOT DETECTED NOT DETECTED Final   Astrovirus NOT DETECTED NOT DETECTED Final   Norovirus GI/GII NOT DETECTED NOT DETECTED  Final   Rotavirus A NOT DETECTED NOT DETECTED Final   Sapovirus (I, II, IV, and V) NOT DETECTED NOT DETECTED Final    Comment: Performed at [facility], [address] [place], [place] [address]  C Difficile Quick Screen w PCR reflex     Status: None   Collection Time: 10/13/23 11:05 AM   Specimen: STOOL  Result Value Ref Range Status   C Diff antigen NEGATIVE NEGATIVE Final   C Diff toxin NEGATIVE NEGATIVE Final   C Diff interpretation No C. difficile detected.  Final    Comment: Performed at [facility], [address], [place], [place] [address]         Radiology Studies: No results found.       Scheduled Meds:  budesonide   0.25 mg Inhalation Daily   busPIRone   15 mg Oral BID   Chlorhexidine  Gluconate Cloth  6 each Topical Daily   dicyclomine   20 mg Oral TID AC & HS   enoxaparin  (LOVENOX ) injection  40 mg Subcutaneous Q24H   gabapentin   200 mg Oral QHS   loratadine   10 mg Oral Daily   losartan   25 mg Oral Daily   montelukast   10 mg Oral QHS   pantoprazole   40 mg Oral BID   saccharomyces boulardii  250 mg Oral BID   sucralfate   1 g Oral TID WC & HS   topiramate   150 mg Oral QHS     LOS: 3 days     Time spent: 55 minutes    Tawan Corkern Loran Rock, DO [facility]  If 7PM-7AM, please contact night-coverage [email or web address] 10/15/2023, 10:33 AM

## 2023-10-15 NOTE — Progress Notes (Signed)
No acute events overnight. Kellogg RN

## 2023-10-16 ENCOUNTER — Encounter (HOSPITAL_COMMUNITY): Payer: Self-pay | Admitting: Family Medicine

## 2023-10-16 ENCOUNTER — Inpatient Hospital Stay (HOSPITAL_COMMUNITY): Payer: 59 | Admitting: Certified Registered Nurse Anesthetist

## 2023-10-16 ENCOUNTER — Encounter (HOSPITAL_COMMUNITY)
Admission: EM | Disposition: A | Discharge status: Home / Self Care | Payer: Self-pay | Source: Home / Self Care | Attending: Internal Medicine

## 2023-10-16 DIAGNOSIS — K529 Noninfective gastroenteritis and colitis, unspecified: Secondary | ICD-10-CM

## 2023-10-16 DIAGNOSIS — K319 Disease of stomach and duodenum, unspecified: Secondary | ICD-10-CM

## 2023-10-16 DIAGNOSIS — R109 Unspecified abdominal pain: Secondary | ICD-10-CM

## 2023-10-16 DIAGNOSIS — R112 Nausea with vomiting, unspecified: Secondary | ICD-10-CM

## 2023-10-16 DIAGNOSIS — E876 Hypokalemia: Secondary | ICD-10-CM | POA: Diagnosis not present

## 2023-10-16 DIAGNOSIS — R1010 Upper abdominal pain, unspecified: Secondary | ICD-10-CM | POA: Diagnosis not present

## 2023-10-16 HISTORY — PX: ESOPHAGOGASTRODUODENOSCOPY (EGD) WITH PROPOFOL: SHX5813

## 2023-10-16 HISTORY — PX: BIOPSY: SHX5522

## 2023-10-16 HISTORY — PX: COLONOSCOPY WITH PROPOFOL: SHX5780

## 2023-10-16 LAB — BASIC METABOLIC PANEL
Anion gap: 8 (ref 5–15)
BUN: 7 mg/dL (ref 6–20)
CO2: 20 mmol/L — ABNORMAL LOW (ref 22–32)
Calcium: 8.9 mg/dL (ref 8.9–10.3)
Chloride: 106 mmol/L (ref 98–111)
Creatinine, Ser: 0.76 mg/dL (ref 0.44–1.00)
GFR, Estimated: >60 mL/min (ref >60)
Glucose, Bld: 94 mg/dL (ref 70–99)
Potassium: 4.1 mmol/L (ref 3.5–5.1)
Sodium: 134 mmol/L — ABNORMAL LOW (ref 135–145)

## 2023-10-16 LAB — CBC
HCT: 39.3 % (ref 36.0–46.0)
Hemoglobin: 12.9 g/dL (ref 12.0–15.0)
MCH: 31.4 pg (ref 26.0–34.0)
MCHC: 32.8 g/dL (ref 30.0–36.0)
MCV: 95.6 fL (ref 80.0–100.0)
Platelets: 298 10*3/uL (ref 150–400)
RBC: 4.11 MIL/uL (ref 3.87–5.11)
RDW: 11.9 % (ref 11.5–15.5)
WBC: 9.5 10*3/uL (ref 4.0–10.5)
nRBC: 0 % (ref 0.0–0.2)

## 2023-10-16 LAB — MAGNESIUM: Magnesium: 1.8 mg/dL (ref 1.7–2.4)

## 2023-10-16 SURGERY — COLONOSCOPY WITH PROPOFOL
Anesthesia: General

## 2023-10-16 MED ORDER — PROPOFOL 10 MG/ML IV BOLUS
INTRAVENOUS | Status: DC | PRN
  Administered 2023-10-16: 100 mg via INTRAVENOUS

## 2023-10-16 MED ORDER — DEXMEDETOMIDINE HCL IN NACL 80 MCG/20ML IV SOLN
INTRAVENOUS | Status: AC
  Filled 2023-10-16: qty 20

## 2023-10-16 MED ORDER — PROPOFOL 500 MG/50ML IV EMUL
INTRAVENOUS | Status: DC | PRN
  Administered 2023-10-16: 150 ug/kg/min via INTRAVENOUS

## 2023-10-16 MED ORDER — FENTANYL CITRATE (PF) 100 MCG/2ML IJ SOLN: 25.0000 ug | INTRAMUSCULAR | Status: DC | PRN

## 2023-10-16 MED ORDER — SODIUM CHLORIDE 0.9 % IV SOLN: INTRAVENOUS | Status: DC

## 2023-10-16 MED ORDER — LACTATED RINGERS IV SOLN: INTRAVENOUS | Status: DC | PRN

## 2023-10-16 MED ORDER — DEXMEDETOMIDINE HCL IN NACL 80 MCG/20ML IV SOLN
INTRAVENOUS | Status: DC | PRN
  Administered 2023-10-16: 8 ug via INTRAVENOUS

## 2023-10-16 MED ORDER — MEPERIDINE HCL 50 MG/ML IJ SOLN: 6.2500 mg | INTRAMUSCULAR | Status: DC | PRN

## 2023-10-16 MED ORDER — DICYCLOMINE HCL 20 MG PO TABS: 20.0000 mg | ORAL_TABLET | Freq: Three times a day (TID) | ORAL | Status: DC | PRN

## 2023-10-16 MED ORDER — LIDOCAINE HCL (PF) 2 % IJ SOLN
INTRAMUSCULAR | Status: DC | PRN
  Administered 2023-10-16: 100 mg via INTRADERMAL

## 2023-10-16 NOTE — Transfer of Care (Signed)
Immediate Anesthesia Transfer of Care Note  Patient: Alicia Owens  Procedure(s) Performed: COLONOSCOPY WITH PROPOFOL ESOPHAGOGASTRODUODENOSCOPY (EGD) WITH PROPOFOL BIOPSY  Patient Location: PACU  Anesthesia Type:General  Level of Consciousness: drowsy  Airway & Oxygen Therapy: Patient Spontanous Breathing  Post-op Assessment: Report given to RN and Post -op Vital signs reviewed and stable  Post vital signs: Reviewed and stable  Last Vitals:  Vitals Value Taken Time  BP 133/77 10/16/23 1400  Temp    Pulse 73 10/16/23 1402  Resp 17 10/16/23 1402  SpO2 100 % 10/16/23 1402  Vitals shown include unfiled device data.  Last Pain:  Vitals:   10/16/23 1318  TempSrc:   PainSc: 5          Complications: No notable events documented.

## 2023-10-16 NOTE — Op Note (Addendum)
[facility] Patient Name: Alicia Owens Procedure Date: 10/16/2023 1:30 PM MRN: [id number] Date of Birth: November 08, 1975 Attending MD: Katrinka Blazing , , [id number] CSN: [id number] Age: 48 Admit Type: Outpatient Procedure:                Colonoscopy Indications:              Upper abdominal pain, Chronic diarrhea Providers:                Katrinka Blazing, Sheran Fava, Kristine L.                            Jessee Avers, Technician Referring MD:              Medicines:                Monitored Anesthesia Care Complications:            No immediate complications. Estimated Blood Loss:     Estimated blood loss: none. Procedure:                Pre-Anesthesia Assessment:                           - Prior to the procedure, a History and Physical                            was performed, and patient medications, allergies                            and sensitivities were reviewed. The patient's                            tolerance of previous anesthesia was reviewed.                           - The risks and benefits of the procedure and the                            sedation options and risks were discussed with the                            patient. All questions were answered and informed                            consent was obtained.                           - ASA Grade Assessment: II - A patient with mild                            systemic disease.                           After obtaining informed consent, the colonoscope                            was passed under direct vision. Throughout the  procedure, the patient's blood pressure, pulse, and                            oxygen saturations were monitored continuously. The                            [id number]-HQ190L ([id number]) scope was introduced through                            the anus and advanced to the the terminal ileum.                            The colonoscopy was performed without  difficulty.                            The patient tolerated the procedure well. The                            quality of the bowel preparation was good. Scope In: 1:32:49 PM Scope Out: 1:55:07 PM Scope Withdrawal Time: 0 hours 17 minutes 57 seconds  Total Procedure Duration: 0 hours 22 minutes 18 seconds  Findings:      The perianal and digital rectal examinations were normal.      The terminal ileum appeared normal.      The entire examined colon appeared normal. Biopsies for histology were       taken with a cold forceps from the right colon and left colon for       evaluation of microscopic colitis.      The retroflexed view of the distal rectum and anal verge was normal and       showed no anal or rectal abnormalities. Impression:               - The examined portion of the ileum was normal.                           - The entire examined colon is normal. Biopsied.                           - The distal rectum and anal verge are normal on                            retroflexion view. Moderate Sedation:      Per Anesthesia Care Recommendation:           - Return patient to hospital ward for ongoing care.                           - Advance diet as tolerated.                           - Zofran as needed for nausea/vomiting.                           - Can use Bentyl 20 mg every 8-12 hours as needed  for abdominal pain.                           - Imodium as needed for diarrhea.                           - If persisting symptoms, may start low dose Elavil.                           - Await pathology results.                           - Check complement, c1 est inh and urine porphyrins.                           - Consider outpatient gastric emptying study. Procedure Code(s):        --- Professional ---                           [phone], Colonoscopy, flexible; with biopsy, single                            or multiple Diagnosis Code(s):        ---  Professional ---                           R10.10, Upper abdominal pain, unspecified                           K52.9, Noninfective gastroenteritis and colitis,                            unspecified CPT copyright 2022 American Medical Association. All rights reserved. The codes documented in this report are preliminary and upon coder review may  be revised to meet current compliance requirements. Katrinka Blazing, MD Katrinka Blazing,  10/16/2023 2:10:12 PM This report has been signed electronically. Number of Addenda: 0

## 2023-10-16 NOTE — Op Note (Signed)
[facility] Patient Name: Alicia Owens Procedure Date: 10/16/2023 1:08 PM MRN: [id number] Date of Birth: 06-06-1976 Attending MD: Katrinka Blazing , , [id number] CSN: [id number] Age: 48 Admit Type: Outpatient Procedure:                Upper GI endoscopy Indications:              Upper abdominal pain, Diarrhea, Nausea with vomiting Providers:                Katrinka Blazing, Sheran Fava, Kristine L.                            Jessee Avers, Technician Referring MD:              Medicines:                Monitored Anesthesia Care Complications:            No immediate complications. Estimated Blood Loss:     Estimated blood loss: none. Procedure:                Pre-Anesthesia Assessment:                           - Prior to the procedure, a History and Physical                            was performed, and patient medications, allergies                            and sensitivities were reviewed. The patient's                            tolerance of previous anesthesia was reviewed.                           - The risks and benefits of the procedure and the                            sedation options and risks were discussed with the                            patient. All questions were answered and informed                            consent was obtained.                           - ASA Grade Assessment: II - A patient with mild                            systemic disease.                           After obtaining informed consent, the endoscope was                            passed under direct vision.  Throughout the                            procedure, the patient's blood pressure, pulse, and                            oxygen saturations were monitored continuously. The                            [id number]-[id number] ([id number]) scope was introduced through the                            mouth, and advanced to the second part of duodenum.                            The upper GI endoscopy  was accomplished without                            difficulty. The patient tolerated the procedure                            well. Scope In: 1:21:13 PM Scope Out: 1:27:24 PM Total Procedure Duration: 0 hours 6 minutes 11 seconds  Findings:      The examined esophagus was normal.      The entire examined stomach was normal. Biopsies were taken with a cold       forceps for Helicobacter pylori testing.      The examined duodenum was normal. Biopsies were taken with a cold       forceps for histology. Impression:               - Normal esophagus.                           - Normal stomach. Biopsied.                           - Normal examined duodenum. Biopsied. Moderate Sedation:      Per Anesthesia Care Recommendation:           - Await pathology results.                           - Proceed with colonoscopy. Procedure Code(s):        --- Professional ---                           [phone], Esophagogastroduodenoscopy, flexible,                            transoral; with biopsy, single or multiple Diagnosis Code(s):        --- Professional ---                           R10.10, Upper abdominal pain, unspecified                           R19.7, Diarrhea, unspecified  R11.2, Nausea with vomiting, unspecified CPT copyright 2022 American Medical Association. All rights reserved. The codes documented in this report are preliminary and upon coder review may  be revised to meet current compliance requirements. Katrinka Blazing, MD Katrinka Blazing,  10/16/2023 1:30:15 PM This report has been signed electronically. Number of Addenda: 0

## 2023-10-16 NOTE — Progress Notes (Signed)
Patient completed bowel prep and has been up to the bathroom many times through the night. Enemas completed and patient tolerated well.

## 2023-10-16 NOTE — Anesthesia Preprocedure Evaluation (Signed)
Anesthesia Evaluation    History of Anesthesia Complications (+) PONV and history of anesthetic complications  Airway Mallampati: II  TM Distance: >3 FB Neck ROM: Full    Dental no notable dental hx.    Pulmonary asthma    Pulmonary exam normal breath sounds clear to auscultation       Cardiovascular hypertension, negative cardio ROS Normal cardiovascular exam Rhythm:Regular Rate:Normal     Neuro/Psych  Headaches PSYCHIATRIC DISORDERS Anxiety Depression       GI/Hepatic Neg liver ROS,GERD  ,,  Endo/Other   Hyperthyroidism   Renal/GU negative Renal ROS     Musculoskeletal   Abdominal   Peds  Hematology  (+) Blood dyscrasia, anemia   Anesthesia Other Findings   Reproductive/Obstetrics                              Anesthesia Physical Anesthesia Plan  ASA: 2  Anesthesia Plan: General   Post-op Pain Management: Minimal or no pain anticipated   Induction: Intravenous  PONV Risk Score and Plan: 1 and Propofol infusion  Airway Management Planned: Simple Face Mask and Nasal Cannula  Additional Equipment:   Intra-op Plan:   Post-operative Plan:   Informed Consent: I have reviewed the patients History and Physical, chart, labs and discussed the procedure including the risks, benefits and alternatives for the proposed anesthesia with the patient or authorized representative who has indicated his/her understanding and acceptance.       Plan Discussed with: CRNA  Anesthesia Plan Comments:          Anesthesia Quick Evaluation

## 2023-10-16 NOTE — Anesthesia Procedure Notes (Signed)
Date/Time: 10/16/2023 1:16 PM  Performed by: Julian Reil, CRNAPre-anesthesia Checklist: Patient identified, Emergency Drugs available, Suction available and Patient being monitored Patient Re-evaluated:Patient Re-evaluated prior to induction Oxygen Delivery Method: Nasal cannula Induction Type: IV induction Placement Confirmation: positive ETCO2 Comments: Optiflow High Flow [place] O2 used.

## 2023-10-16 NOTE — Anesthesia Postprocedure Evaluation (Signed)
Anesthesia Post Note  Patient: JAQUALA FULLER  Procedure(s) Performed: COLONOSCOPY WITH PROPOFOL ESOPHAGOGASTRODUODENOSCOPY (EGD) WITH PROPOFOL BIOPSY  Patient location during evaluation: PACU Anesthesia Type: General Level of consciousness: awake and alert Pain management: pain level controlled Vital Signs Assessment: post-procedure vital signs reviewed and stable Respiratory status: spontaneous breathing, nonlabored ventilation and respiratory function stable Cardiovascular status: blood pressure returned to baseline and stable Postop Assessment: no apparent nausea or vomiting Anesthetic complications: no   No notable events documented.   Last Vitals:  Vitals:   10/16/23 1430 10/16/23 1457  BP: 132/84 127/82  Pulse: 65 80  Resp: 20 12  Temp: 36.5 C 36.7 C  SpO2: 100% 100%    Last Pain:  Vitals:   10/16/23 1457  TempSrc: Oral  PainSc:                  Roslynn Amble

## 2023-10-16 NOTE — Progress Notes (Signed)
Dr.Castenada spoke with patients family in short stay.  Then family  (2 gentleman) were directed back upstairs to the patients room as the patient was returned to her room just minutes ago.

## 2023-10-16 NOTE — Progress Notes (Signed)
We will proceed with EGD and colonoscopy as scheduled.  I thoroughly discussed with the patient the procedure, including the risks involved. Patient understands what the procedure involves including the benefits and any risks. Patient understands alternatives to the proposed procedure. Risks including (but not limited to) bleeding, tearing of the lining (perforation), rupture of adjacent organs, problems with heart and lung function, infection, and medication reactions. A small percentage of complications may require surgery, hospitalization, repeat endoscopic procedure, and/or transfusion.  Patient understood and agreed.  Katrinka Blazing, MD Gastroenterology and Hepatology [facility] Gastroenterology

## 2023-10-16 NOTE — Brief Op Note (Addendum)
10/12/2023 - 10/16/2023  1:30 PM  PATIENT:  Alicia Owens  48 y.o. female  PRE-OPERATIVE DIAGNOSIS:  abdominal pain, diarrhea, nausea, vomiting  POST-OPERATIVE DIAGNOSIS:  * No post-op diagnosis entered *  PROCEDURE:  Procedure(s) with comments: COLONOSCOPY WITH PROPOFOL (N/A) ESOPHAGOGASTRODUODENOSCOPY (EGD) WITH PROPOFOL (N/A) - abdominal pain, nausea, vomiting, diarrhea  SURGEON:  Surgeons and Role: Panel 1:    * Dolores Frame, MD - Primary   Patient underwent EGD and colonoscopy under propofol sedation.  Tolerated the procedure adequately.   EGD FINDINGS: - Normal esophagus.  - Normal stomach.  Biopsied.  - Normal examined duodenum.  Biopsied.   COLONOSCOPY FINDINGS: - The examined portion of the ileum was normal.  - The entire examined colon is normal.  Biopsied.  - The distal rectum and anal verge are normal on retroflexion view.   RECOMMENDATIONS - Return patient to hospital ward for ongoing care.  - Advance diet as tolerated.  - Zofran as needed for nausea/vomiting. - Can use Bentyl 10 mg every 8 hours as needed for abdominal pain. - Imodium as needed for diarrhea. - Await pathology results.  - Check complement, c1 est inh and urine porphyrins. - Consider outpatient gastric emptying study.  Katrinka Blazing, MD Gastroenterology and Hepatology [facility] Gastroenterology

## 2023-10-16 NOTE — Progress Notes (Signed)
PROGRESS NOTE    Alicia Owens  WUJ:[id number] DOB: 1976-01-01 DOA: 10/12/2023 PCP: Babs Sciara, MD   Brief Narrative:    Alicia Owens is a 48 y.o. female with medical history significant of Hypertension, IBS, GERD, history of gastroparesis.  Patient has been having generalized abdominal pain, nausea, diarrhea for the last few weeks that has been worse over the last 4 to 5 days.  She was admitted for evaluation of abdominal pain by GI as well as noted severe hypokalemia which has now improved.  She underwent EGD and colonoscopy on 2/11 with no acute findings and is now slowly having diet advanced.  Assessment & Plan:   Principal Problem:   Hypokalemia Active Problems:   ANXIETY DEPRESSION   GERD   Diarrhea   Hypertension   Abdominal pain, epigastric   Abdominal pain  Assessment and Plan:   Severe hypokalemia-resolved Hold further potassium supplementation Stop HCTZ as this may be contributing to the hypokalemia Abdominal pain-likely functional Ultrasound with hepatic steatosis EGD and colonoscopy 2/11 with no acute findings Advance diet as tolerated and GI planning further workup for porphyria Diarrhea No significant findings on colonoscopy Imodium Stool GI panel negative Hypertension Will stop HCTZ as this may be contributing to the patient's hypokalemia Will change the patient to Cozaar Anxiety depression Per EDP note, patient did have some thoughts of self-harm this morning.  She is currently not having any suicide ideation. She did recently start Celexa, which she has been on in the past.  Can continue for now.    DVT prophylaxis:Lovenox Code Status: Full Family Communication: Husband at bedside 2/11 Disposition Plan:  Status is: Inpatient Remains inpatient appropriate because: Need for IV medications.  Consultants:  GI  Procedures:  EGD/colonoscopy 2/11  Antimicrobials:  None   Subjective: Patient seen and evaluated today with ongoing abdominal  pain as well as nausea and diarrhea.  She does not feel improved.  Plan is for EGD/colonoscopy today.  Objective: Vitals:   10/16/23 1400 10/16/23 1415 10/16/23 1430 10/16/23 1457  BP: 133/77 129/84 132/84 127/82  Pulse: 76 70 65 80  Resp: 13 15 20 12   Temp: 97.8 F (36.6 C)  97.7 F (36.5 C) 98.1 F (36.7 C)  TempSrc:    Oral  SpO2: 100% 100% 100% 100%  Weight:      Height:        Intake/Output Summary (Last 24 hours) at 10/16/2023 1500 Last data filed at 10/16/2023 1355 Gross per 24 hour  Intake 600 ml  Output --  Net 600 ml   Filed Weights   10/12/23 1141  Weight: 69.7 kg    Examination:  General exam: Appears calm and comfortable  Respiratory system: Clear to auscultation. Respiratory effort normal. Cardiovascular system: S1 & S2 heard, RRR.  Gastrointestinal system: Abdomen is soft Central nervous system: Alert and awake Extremities: No edema Skin: No significant lesions noted Psychiatry: Flat affect.    Data Reviewed: I have personally reviewed following labs and imaging studies  CBC: Recent Labs  Lab 10/10/23 1239 10/12/23 1315 10/13/23 0238 10/14/23 0608 10/15/23 0249 10/16/23 0539  WBC 12.3* 9.4 12.6* 9.7 11.3* 9.5  NEUTROABS 9.3* 7.0  --   --   --   --   HGB 15.2* 12.5 13.6 12.3 12.0 12.9  HCT 42.2 35.5* 39.8 36.4 35.9* 39.3  MCV 86.5 90.6 92.8 95.5 96.2 95.6  PLT 433* 330 387 331 324 298   Basic Metabolic Panel: Recent Labs  Lab 10/10/23 1239  10/10/23 1755 10/12/23 1315 10/13/23 0238 10/14/23 0608 10/15/23 0249 10/16/23 0539  NA 129*   < > 135 135 137 137 134*  K 2.2*   < > <2.0* 4.4 4.8 5.0 4.1  CL 89*   < > 108 103 109 108 106  CO2 26   < > 18* 22 22 22  20*  GLUCOSE 124*   < > 90 85 86 77 94  BUN 5*   < > <5* <5* <5* 6 7  CREATININE 0.68   < > 0.47 0.61 0.67 0.71 0.76  CALCIUM 9.0   < > 6.1* 8.7* 8.6* 8.8* 8.9  MG 2.1  --  1.4*  --  2.0 2.1 1.8  PHOS  --   --  1.4*  --   --   --   --    < > = values in this interval not  displayed.   GFR: Estimated Creatinine Clearance: 78.6 mL/min (by C-G formula based on SCr of 0.76 mg/dL). Liver Function Tests: Recent Labs  Lab 10/09/23 1507 10/10/23 1239 10/12/23 1315  AST 17 26 21   ALT 20 25 28   ALKPHOS  --  68 48  BILITOT 0.6 0.8 0.4  PROT 8.2* 8.1 5.0*  ALBUMIN  --  4.6 2.7*   Recent Labs  Lab 10/09/23 1507 10/10/23 1239 10/12/23 1315  LIPASE 25 29 28    No results for input(s): "AMMONIA" in the last 168 hours. Coagulation Profile: No results for input(s): "INR", "PROTIME" in the last 168 hours. Cardiac Enzymes: No results for input(s): "CKTOTAL", "CKMB", "CKMBINDEX", "TROPONINI" in the last 168 hours. BNP (last 3 results) No results for input(s): "PROBNP" in the last 8760 hours. HbA1C: No results for input(s): "HGBA1C" in the last 72 hours. CBG: No results for input(s): "GLUCAP" in the last 168 hours. Lipid Profile: No results for input(s): "CHOL", "HDL", "LDLCALC", "TRIG", "CHOLHDL", "LDLDIRECT" in the last 72 hours. Thyroid Function Tests: No results for input(s): "TSH", "T4TOTAL", "FREET4", "T3FREE", "THYROIDAB" in the last 72 hours.  Anemia Panel: No results for input(s): "VITAMINB12", "FOLATE", "FERRITIN", "TIBC", "IRON", "RETICCTPCT" in the last 72 hours. Sepsis Labs: No results for input(s): "PROCALCITON", "LATICACIDVEN" in the last 168 hours.  Recent Results (from the past 240 hours)  MRSA Next Gen by PCR, Nasal     Status: None   Collection Time: 10/12/23  8:31 PM   Specimen: Nasal Mucosa; Nasal Swab  Result Value Ref Range Status   MRSA by PCR Next Gen NOT DETECTED NOT DETECTED Final    Comment: (NOTE) The GeneXpert MRSA Assay (FDA approved for NASAL specimens only), is one component of a comprehensive MRSA colonization surveillance program. It is not intended to diagnose MRSA infection nor to guide or monitor treatment for MRSA infections. Test performance is not FDA approved in patients less than 82 years old. Performed at  [facility], [address] [place], [place] [address]   Gastrointestinal Panel by PCR , Stool     Status: None   Collection Time: 10/13/23  6:58 AM   Specimen: Stool  Result Value Ref Range Status   Campylobacter species NOT DETECTED NOT DETECTED Final   Plesimonas shigelloides NOT DETECTED NOT DETECTED Final   Salmonella species NOT DETECTED NOT DETECTED Final   Yersinia enterocolitica NOT DETECTED NOT DETECTED Final   Vibrio species NOT DETECTED NOT DETECTED Final   Vibrio cholerae NOT DETECTED NOT DETECTED Final   Enteroaggregative E coli (EAEC) NOT DETECTED NOT DETECTED Final   Enteropathogenic E coli (EPEC)  NOT DETECTED NOT DETECTED Final   Enterotoxigenic E coli (ETEC) NOT DETECTED NOT DETECTED Final   Shiga like toxin producing E coli (STEC) NOT DETECTED NOT DETECTED Final   Shigella/Enteroinvasive E coli (EIEC) NOT DETECTED NOT DETECTED Final   Cryptosporidium NOT DETECTED NOT DETECTED Final   Cyclospora cayetanensis NOT DETECTED NOT DETECTED Final   Entamoeba histolytica NOT DETECTED NOT DETECTED Final   Giardia lamblia NOT DETECTED NOT DETECTED Final   Adenovirus F40/41 NOT DETECTED NOT DETECTED Final   Astrovirus NOT DETECTED NOT DETECTED Final   Norovirus GI/GII NOT DETECTED NOT DETECTED Final   Rotavirus A NOT DETECTED NOT DETECTED Final   Sapovirus (I, II, IV, and V) NOT DETECTED NOT DETECTED Final    Comment: Performed at [facility], [address] [place], [place] [address]  C Difficile Quick Screen w PCR reflex     Status: None   Collection Time: 10/13/23 11:05 AM   Specimen: STOOL  Result Value Ref Range Status   C Diff antigen NEGATIVE NEGATIVE Final   C Diff toxin NEGATIVE NEGATIVE Final   C Diff interpretation No C. difficile detected.  Final    Comment: Performed at [facility], [address], [place], [place] [address]         Radiology Studies: No results found.       Scheduled Meds:  budesonide  0.25 mg Inhalation Daily    busPIRone  15 mg Oral BID   Chlorhexidine Gluconate Cloth  6 each Topical Daily   citalopram  10 mg Oral Daily   dicyclomine  20 mg Oral TID AC & HS   enoxaparin (LOVENOX) injection  40 mg Subcutaneous Q24H   gabapentin  200 mg Oral QHS   loratadine  10 mg Oral Daily   losartan  25 mg Oral Daily   montelukast  10 mg Oral QHS   pantoprazole  40 mg Oral BID   saccharomyces boulardii  250 mg Oral BID   sucralfate  1 g Oral TID WC & HS   topiramate  150 mg Oral QHS     LOS: 4 days    Time spent: 55 minutes    Tessia Kassin Hoover Brunette, DO [facility]  If 7PM-7AM, please contact night-coverage [email or web address] 10/16/2023, 3:00 PM

## 2023-10-16 NOTE — Telephone Encounter (Signed)
Nurses It would be fair to cancel this message given that Alicia Owens is in the hospital.  They are addressing all of these issues and are having her do EGD and colonoscopy which is a very thorough workup.  We are more than willing and able to provide close follow-up upon discharge from the hospital thank you-Dr. Lorin Picket

## 2023-10-16 NOTE — Plan of Care (Signed)
Problem: Education: Goal: Knowledge of General Education information will improve Description Including pain rating scale, medication(s)/side effects and non-pharmacologic comfort measures Outcome: Progressing   Problem: Health Behavior/Discharge Planning: Goal: Ability to manage health-related needs will improve Outcome: Progressing

## 2023-10-17 ENCOUNTER — Encounter: Payer: Self-pay | Admitting: Family Medicine

## 2023-10-17 ENCOUNTER — Ambulatory Visit: Payer: 59 | Admitting: Gastroenterology

## 2023-10-17 ENCOUNTER — Encounter (HOSPITAL_COMMUNITY): Payer: Self-pay | Admitting: Gastroenterology

## 2023-10-17 ENCOUNTER — Telehealth: Payer: Self-pay | Admitting: Family Medicine

## 2023-10-17 DIAGNOSIS — R1084 Generalized abdominal pain: Secondary | ICD-10-CM | POA: Diagnosis not present

## 2023-10-17 DIAGNOSIS — F341 Dysthymic disorder: Secondary | ICD-10-CM

## 2023-10-17 DIAGNOSIS — F411 Generalized anxiety disorder: Secondary | ICD-10-CM

## 2023-10-17 DIAGNOSIS — K219 Gastro-esophageal reflux disease without esophagitis: Secondary | ICD-10-CM

## 2023-10-17 DIAGNOSIS — E876 Hypokalemia: Secondary | ICD-10-CM | POA: Diagnosis not present

## 2023-10-17 LAB — CBC
HCT: 37.6 % (ref 36.0–46.0)
Hemoglobin: 12.4 g/dL (ref 12.0–15.0)
MCH: 31 pg (ref 26.0–34.0)
MCHC: 33 g/dL (ref 30.0–36.0)
MCV: 94 fL (ref 80.0–100.0)
Platelets: 328 10*3/uL (ref 150–400)
RBC: 4 MIL/uL (ref 3.87–5.11)
RDW: 11.9 % (ref 11.5–15.5)
WBC: 10.9 10*3/uL — ABNORMAL HIGH (ref 4.0–10.5)
nRBC: 0 % (ref 0.0–0.2)

## 2023-10-17 LAB — MAGNESIUM: Magnesium: 1.8 mg/dL (ref 1.7–2.4)

## 2023-10-17 LAB — BASIC METABOLIC PANEL
Anion gap: 9 (ref 5–15)
BUN: 7 mg/dL (ref 6–20)
CO2: 22 mmol/L (ref 22–32)
Calcium: 8.6 mg/dL — ABNORMAL LOW (ref 8.9–10.3)
Chloride: 104 mmol/L (ref 98–111)
Creatinine, Ser: 0.72 mg/dL (ref 0.44–1.00)
GFR, Estimated: >60 mL/min (ref >60)
Glucose, Bld: 73 mg/dL (ref 70–99)
Potassium: 3.7 mmol/L (ref 3.5–5.1)
Sodium: 135 mmol/L (ref 135–145)

## 2023-10-17 LAB — SURGICAL PATHOLOGY

## 2023-10-17 MED ORDER — LACTATED RINGERS IV SOLN: INTRAVENOUS | Status: DC

## 2023-10-17 MED ORDER — AMITRIPTYLINE HCL 25 MG PO TABS: 25.0000 mg | ORAL_TABLET | Freq: Every day | ORAL | 1 refills | Status: DC

## 2023-10-17 MED ORDER — VALSARTAN 80 MG PO TABS: 80.0000 mg | ORAL_TABLET | Freq: Every day | ORAL | 1 refills | Status: DC

## 2023-10-17 MED ORDER — SUMATRIPTAN SUCCINATE 100 MG PO TABS: 100.0000 mg | ORAL_TABLET | ORAL | Status: DC | PRN

## 2023-10-17 NOTE — Discharge Summary (Addendum)
Physician Discharge Summary  Alicia Owens UEA:[id number] DOB: [birth year] DOA: 10/12/2023  PCP: Babs Sciara, MD GI: [facility]   Admit date: 10/12/2023 Discharge date: 10/17/2023  Admitted From:  Home  Disposition: Home   Recommendations for Outpatient Follow-up:  Follow up with PCP in 1 weeks Follow up with [facility] in 2 weeks Please consider referral to behavioral health specialist regarding GAD and dysthymia  Discharge Condition: STABLE   CODE STATUS: FULL DIET: soft foods, advance as tolerated    Brief Hospitalization Summary: Please see all hospital notes, images, labs for full details of the hospitalization.  48 y.o. female with medical history significant of Hypertension, IBS, GERD, history of gastroparesis.  Patient has been having generalized abdominal pain, nausea, diarrhea for the last few weeks that has been worse over the last 4 to 5 days.  She was admitted for evaluation of abdominal pain by GI as well as noted severe hypokalemia which has now improved.  She underwent EGD and colonoscopy on 2/11 with no acute findings and is now slowly having diet advanced.   Assessment & Plan:   Principal Problem:   Hypokalemia Active Problems:   ANXIETY DEPRESSION   GERD   Diarrhea   Hypertension   Abdominal pain, epigastric   Abdominal pain   Assessment and Plan:     Severe hypokalemia-resolved after treatments Hold further potassium supplementation Discontinued HCTZ as this may be contributing to the hypokalemia Abdominal pain-likely functional Ultrasound with hepatic steatosis EGD and colonoscopy 2/11 with no acute findings Advance diet as tolerated and GI planning further workup for porphyria on outpatient basis GI team recommended to me to stop citalopram and DC home on buspar and amitriptyline low dose to take at night  Diarrhea - Resolved  No significant findings on colonoscopy Imodium Stool GI panel negative Hypertension Discontinued HCTZ as this may  be contributing to the patient's hypokalemia Changed the patient to losartan in hospital (DC on valsartan due to insurance coverage issues with losartan) Anxiety depression She currently denies suicide ideation and was contracted for safety. She did recently start Celexa, which she has been on in the past but has been changed now to amitryptyline 25 mg at bedtime per GI recommendations and we strongly recommend patient to discuss with PCP regarding outpatient referral to behavioral health specialist.   Discharge Diagnoses:  Principal Problem:   Hypokalemia Active Problems:   ANXIETY DEPRESSION   GERD   Diarrhea   Hypertension   Abdominal pain, epigastric   Abdominal pain   Discharge Instructions:  Allergies as of 10/17/2023       Reactions   Ciprofloxacin    Severe nausea and vomiting when combined with flagyl but not by itself   Codeine    REACTION: GI Upset, Vomiting   Flagyl [metronidazole]    Severe nausea and vomiting   Promethazine Hcl    REACTION: Throat felt strange   Reglan [metoclopramide]    twitching   Sulfonamide Derivatives    REACTION: Rash        Medication List     STOP taking these medications    citalopram 10 MG tablet Commonly known as: CeleXA   GI Cocktail (alum & mag hydroxide-simethicone/lidocaine)oral mixture   hydrochlorothiazide 25 MG tablet Commonly known as: HYDRODIURIL   potassium chloride SA 20 MEQ tablet Commonly known as: KLOR-CON M       TAKE these medications    acetaminophen 500 MG tablet Commonly known as: TYLENOL Take 500-1,000 mg by mouth  every 6 (six) hours as needed for moderate pain (pain score 4-6).   ALPRAZolam 0.25 MG tablet Commonly known as: XANAX TAKE ONE TABLET BY MOUTH TWICE DAILY AS NEEDED   amitriptyline 25 MG tablet Commonly known as: ELAVIL Take 1 tablet (25 mg total) by mouth at bedtime.   busPIRone 15 MG tablet Commonly known as: BUSPAR TAKE ONE TABLET BY MOUTH TWICE A DAY   CRANBERRY  PO Take 1 tablet by mouth daily.   Culturelle Digestive Health Caps Take 1 capsule by mouth daily.   cyclobenzaprine 10 MG tablet Commonly known as: FLEXERIL TAKE ONE (1) TABLET BY MOUTH 3 TIMES DAILY AS NEEDED (BETWEEN MEALS AND AT BEDTIME)   dicyclomine 20 MG tablet Commonly known as: BENTYL TAKE ONE TABLET BY MOUTH THREE TIMES DAILY AS NEEDED OR AS DIRECTED   estradiol 0.1 MG/24HR patch Commonly known as: VIVELLE-DOT Place 1 patch (0.1 mg total) onto the skin 2 (two) times a week.   fexofenadine 180 MG tablet Commonly known as: ALLEGRA Take 180 mg by mouth daily.   FIBER PO Take 1 Dose by mouth daily.   gabapentin 100 MG capsule Commonly known as: NEURONTIN 2 qhs   IRON PO Take 1 tablet by mouth daily.   montelukast 10 MG tablet Commonly known as: SINGULAIR TAKE ONE (1) TABLET BY MOUTH EVERY DAY What changed:  how much to take how to take this when to take this additional instructions   multivitamin with minerals tablet Take 1 tablet by mouth daily.   NASACORT AQ NA Place 1 spray into the nose 2 (two) times daily as needed (Rhinitis).   ondansetron 4 MG tablet Commonly known as: ZOFRAN Take 1 tablet (4 mg total) by mouth every 8 (eight) hours as needed for nausea or vomiting.   pantoprazole 40 MG tablet Commonly known as: PROTONIX TAKE ONE TABLET BY MOUTH TWICE A DAY   Pulmicort Flexhaler 180 MCG/ACT inhaler Generic drug: budesonide Inhale 1 puff into the lungs in the morning and at bedtime. What changed:  how much to take when to take this   sucralfate 1 g tablet Commonly known as: Carafate Take 1 tablet (1 g total) by mouth 4 (four) times daily -  with meals and at bedtime. Dissolve tablet in 1/2 of water, mix and drink slurry.   SUMAtriptan 100 MG tablet Commonly known as: IMITREX Take 1 tablet (100 mg total) by mouth every 2 (two) hours as needed for headache or migraine.   SUPER B COMPLEX/C PO Take 1 tablet by mouth daily.   topiramate  50 MG tablet Commonly known as: TOPAMAX TAKE 3 TABLETS BY MOUTH IN THE EVENING   TURMERIC PO Take 1 capsule by mouth daily.   Ubrelvy 50 MG Tabs Generic drug: Ubrogepant Take one tab po at onset of migraine; may repeat dose x 1 after 2 hours if needed   valsartan 80 MG tablet Commonly known as: Diovan Take 1 tablet (80 mg total) by mouth daily.   VITAMIN D (CHOLECALCIFEROL) PO Take 1 tablet by mouth daily.        Follow-up Information     [facility]. Schedule an appointment as soon as possible for a visit in 2 week(s).   Specialty: Gastroenterology Why: Hospital Follow Up Contact information: [address] [place] [address] [phone]        Babs Sciara, MD. Schedule an appointment as soon as possible for a visit in 1 week(s).  Specialty: Family Medicine Why: Hospital Follow Up Contact information: [address] [place] [address] [phone]                Allergies  Allergen Reactions   Ciprofloxacin     Severe nausea and vomiting when combined with flagyl but not by itself   Codeine     REACTION: GI Upset, Vomiting   Flagyl [Metronidazole]     Severe nausea and vomiting   Promethazine Hcl     REACTION: Throat felt strange   Reglan [Metoclopramide]     twitching   Sulfonamide Derivatives     REACTION: Rash   Allergies as of 10/17/2023       Reactions   Ciprofloxacin    Severe nausea and vomiting when combined with flagyl but not by itself   Codeine    REACTION: GI Upset, Vomiting   Flagyl [metronidazole]    Severe nausea and vomiting   Promethazine Hcl    REACTION: Throat felt strange   Reglan [metoclopramide]    twitching   Sulfonamide Derivatives    REACTION: Rash        Medication List     STOP taking these medications    citalopram 10 MG tablet Commonly known as: CeleXA   GI Cocktail (alum & mag hydroxide-simethicone/lidocaine)oral  mixture   hydrochlorothiazide 25 MG tablet Commonly known as: HYDRODIURIL   potassium chloride SA 20 MEQ tablet Commonly known as: KLOR-CON M       TAKE these medications    acetaminophen 500 MG tablet Commonly known as: TYLENOL Take 500-1,000 mg by mouth every 6 (six) hours as needed for moderate pain (pain score 4-6).   ALPRAZolam 0.25 MG tablet Commonly known as: XANAX TAKE ONE TABLET BY MOUTH TWICE DAILY AS NEEDED   amitriptyline 25 MG tablet Commonly known as: ELAVIL Take 1 tablet (25 mg total) by mouth at bedtime.   busPIRone 15 MG tablet Commonly known as: BUSPAR TAKE ONE TABLET BY MOUTH TWICE A DAY   CRANBERRY PO Take 1 tablet by mouth daily.   Culturelle Digestive Health Caps Take 1 capsule by mouth daily.   cyclobenzaprine 10 MG tablet Commonly known as: FLEXERIL TAKE ONE (1) TABLET BY MOUTH 3 TIMES DAILY AS NEEDED (BETWEEN MEALS AND AT BEDTIME)   dicyclomine 20 MG tablet Commonly known as: BENTYL TAKE ONE TABLET BY MOUTH THREE TIMES DAILY AS NEEDED OR AS DIRECTED   estradiol 0.1 MG/24HR patch Commonly known as: VIVELLE-DOT Place 1 patch (0.1 mg total) onto the skin 2 (two) times a week.   fexofenadine 180 MG tablet Commonly known as: ALLEGRA Take 180 mg by mouth daily.   FIBER PO Take 1 Dose by mouth daily.   gabapentin 100 MG capsule Commonly known as: NEURONTIN 2 qhs   IRON PO Take 1 tablet by mouth daily.   montelukast 10 MG tablet Commonly known as: SINGULAIR TAKE ONE (1) TABLET BY MOUTH EVERY DAY What changed:  how much to take how to take this when to take this additional instructions   multivitamin with minerals tablet Take 1 tablet by mouth daily.   NASACORT AQ NA Place 1 spray into the nose 2 (two) times daily as needed (Rhinitis).   ondansetron 4 MG tablet Commonly known as: ZOFRAN Take 1 tablet (4 mg total) by mouth every 8 (eight) hours as needed for nausea or vomiting.   pantoprazole 40 MG tablet Commonly known  as: PROTONIX TAKE ONE TABLET BY MOUTH TWICE A DAY  Pulmicort Flexhaler 180 MCG/ACT inhaler Generic drug: budesonide Inhale 1 puff into the lungs in the morning and at bedtime. What changed:  how much to take when to take this   sucralfate 1 g tablet Commonly known as: Carafate Take 1 tablet (1 g total) by mouth 4 (four) times daily -  with meals and at bedtime. Dissolve tablet in 1/2 of water, mix and drink slurry.   SUMAtriptan 100 MG tablet Commonly known as: IMITREX Take 1 tablet (100 mg total) by mouth every 2 (two) hours as needed for headache or migraine.   SUPER B COMPLEX/C PO Take 1 tablet by mouth daily.   topiramate 50 MG tablet Commonly known as: TOPAMAX TAKE 3 TABLETS BY MOUTH IN THE EVENING   TURMERIC PO Take 1 capsule by mouth daily.   Ubrelvy 50 MG Tabs Generic drug: Ubrogepant Take one tab po at onset of migraine; may repeat dose x 1 after 2 hours if needed   valsartan 80 MG tablet Commonly known as: Diovan Take 1 tablet (80 mg total) by mouth daily.   VITAMIN D (CHOLECALCIFEROL) PO Take 1 tablet by mouth daily.        Procedures/Studies: US Abdomen Limited RUQ (LIVER/GB) Result Date: 10/13/2023 CLINICAL DATA:  Right upper quadrant abdominal pain for 2 weeks EXAM: ULTRASOUND ABDOMEN LIMITED RIGHT UPPER QUADRANT COMPARISON:  CT abdomen pelvis 10/08/2013 FINDINGS: Gallbladder: Status post cholecystectomy Common bile duct: Diameter: 4 mm Liver: Parenchymal echogenicity: Homogeneously increased Contours: Normal Lesions: None Portal vein: Patent.  Hepatopetal flow Other: None. IMPRESSION: Diffuse increased echogenicity of the hepatic parenchyma is a nonspecific indicator of hepatocellular dysfunction, most commonly steatosis. Electronically Signed   By: Acquanetta Belling M.D.   On: 10/13/2023 10:18   CT ABDOMEN PELVIS W CONTRAST Result Date: 10/09/2023 CLINICAL DATA:  Epigastric pain radiating to the back. EXAM: CT ABDOMEN AND PELVIS WITH CONTRAST TECHNIQUE:  Multidetector CT imaging of the abdomen and pelvis was performed using the standard protocol following bolus administration of intravenous contrast. RADIATION DOSE REDUCTION: This exam was performed according to the departmental dose-optimization program which includes automated exposure control, adjustment of the mA and/or kV according to patient size and/or use of iterative reconstruction technique. CONTRAST:  OMNIPAQUE IOHEXOL 300 MG/ML  SOLN COMPARISON:  05/12/2023 FINDINGS: Lower chest: Lung bases are clear. Hepatobiliary: No focal liver abnormality is seen. Status post cholecystectomy. No biliary dilatation. Pancreas: Unremarkable. No pancreatic ductal dilatation or surrounding inflammatory changes. Spleen: Normal in size without focal abnormality. Adrenals/Urinary Tract: Adrenal glands are unremarkable. Kidneys are normal, without renal calculi, focal lesion, or hydronephrosis. Bladder is unremarkable. Stomach/Bowel: Stomach is within normal limits. Appendix appears surgically absent. No evidence of bowel wall thickening, distention, or inflammatory changes. Vascular/Lymphatic: No significant vascular findings are present. No enlarged abdominal or pelvic lymph nodes. Reproductive: Status post hysterectomy. No adnexal masses. Other: No abdominal wall hernia or abnormality. No abdominopelvic ascites. Musculoskeletal: No acute or significant osseous findings. IMPRESSION: No acute process demonstrated in the abdomen or pelvis. Normal appearance of the pancreas. No evidence of acute pancreatitis. Electronically Signed   By: Burman Nieves M.D.   On: 10/09/2023 17:14     Subjective: Pt having less abd pain and tolerating her diet now.  GI saw her and ok with DC home today with close outpatient follow up.  Pt denies SI.   Discharge Exam: Vitals:   10/17/23 0531 10/17/23 0820  BP: 110/74   Pulse: 68   Resp: 20   Temp: (!) 97.5 F (  36.4 C)   SpO2: 100% 100%   Vitals:   10/16/23 1457 10/16/23  2243 10/17/23 0531 10/17/23 0820  BP: 127/82 121/74 110/74   Pulse: 80 78 68   Resp: 12  20   Temp: 98.1 F (36.7 C) 98.6 F (37 C) (!) 97.5 F (36.4 C)   TempSrc: Oral Oral Oral   SpO2: 100% 98% 100% 100%  Weight:      Height:       General: Pt is alert, awake, not in acute distress Cardiovascular: RRR, S1/S2 +, no rubs, no gallops Respiratory: CTA bilaterally, no wheezing, no rhonchi Abdominal: Soft, NT, ND, bowel sounds + Extremities: no edema, no cyanosis   The results of significant diagnostics from this hospitalization (including imaging, microbiology, ancillary and laboratory) are listed below for reference.     Microbiology: Recent Results (from the past 240 hours)  MRSA Next Gen by PCR, Nasal     Status: None   Collection Time: 10/12/23  8:31 PM   Specimen: Nasal Mucosa; Nasal Swab  Result Value Ref Range Status   MRSA by PCR Next Gen NOT DETECTED NOT DETECTED Final    Comment: (NOTE) The GeneXpert MRSA Assay (FDA approved for NASAL specimens only), is one component of a comprehensive MRSA colonization surveillance program. It is not intended to diagnose MRSA infection nor to guide or monitor treatment for MRSA infections. Test performance is not FDA approved in patients less than 75 years old. Performed at [facility], [address], [place], [place] [address]   Gastrointestinal Panel by PCR , Stool     Status: None   Collection Time: 10/13/23  6:58 AM   Specimen: Stool  Result Value Ref Range Status   Campylobacter species NOT DETECTED NOT DETECTED Final   Plesimonas shigelloides NOT DETECTED NOT DETECTED Final   Salmonella species NOT DETECTED NOT DETECTED Final   Yersinia enterocolitica NOT DETECTED NOT DETECTED Final   Vibrio species NOT DETECTED NOT DETECTED Final   Vibrio cholerae NOT DETECTED NOT DETECTED Final   Enteroaggregative E coli (EAEC) NOT DETECTED NOT DETECTED Final   Enteropathogenic E coli (EPEC) NOT DETECTED NOT DETECTED Final    Enterotoxigenic E coli (ETEC) NOT DETECTED NOT DETECTED Final   Shiga like toxin producing E coli (STEC) NOT DETECTED NOT DETECTED Final   Shigella/Enteroinvasive E coli (EIEC) NOT DETECTED NOT DETECTED Final   Cryptosporidium NOT DETECTED NOT DETECTED Final   Cyclospora cayetanensis NOT DETECTED NOT DETECTED Final   Entamoeba histolytica NOT DETECTED NOT DETECTED Final   Giardia lamblia NOT DETECTED NOT DETECTED Final   Adenovirus F40/41 NOT DETECTED NOT DETECTED Final   Astrovirus NOT DETECTED NOT DETECTED Final   Norovirus GI/GII NOT DETECTED NOT DETECTED Final   Rotavirus A NOT DETECTED NOT DETECTED Final   Sapovirus (I, II, IV, and V) NOT DETECTED NOT DETECTED Final    Comment: Performed at [facility], [address] [place], [place] [address]  C Difficile Quick Screen w PCR reflex     Status: None   Collection Time: 10/13/23 11:05 AM   Specimen: STOOL  Result Value Ref Range Status   C Diff antigen NEGATIVE NEGATIVE Final   C Diff toxin NEGATIVE NEGATIVE Final   C Diff interpretation No C. difficile detected.  Final    Comment: Performed at [facility], [address], [place], [place] [address]     Labs: BNP (last 3 results) No results for input(s): "BNP" in the last 8760 hours. Basic Metabolic  Panel: Recent Labs  Lab 10/12/23 1315 10/13/23 0238 10/14/23 0608 10/15/23 0249 10/16/23 0539 10/17/23 0429  NA 135 135 137 137 134* 135  K <2.0* 4.4 4.8 5.0 4.1 3.7  CL 108 103 109 108 106 104  CO2 18* 22 22 22  20* 22  GLUCOSE 90 85 86 77 94 73  BUN <5* <5* <5* 6 7 7   CREATININE 0.47 0.61 0.67 0.71 0.76 0.72  CALCIUM 6.1* 8.7* 8.6* 8.8* 8.9 8.6*  MG 1.4*  --  2.0 2.1 1.8 1.8  PHOS 1.4*  --   --   --   --   --    Liver Function Tests: Recent Labs  Lab 10/12/23 1315  AST 21  ALT 28  ALKPHOS 48  BILITOT 0.4  PROT 5.0*  ALBUMIN 2.7*   Recent Labs  Lab 10/12/23 1315  LIPASE 28   No results for input(s): "AMMONIA" in the last 168 hours. CBC: Recent  Labs  Lab 10/12/23 1315 10/13/23 0238 10/14/23 0608 10/15/23 0249 10/16/23 0539 10/17/23 0429  WBC 9.4 12.6* 9.7 11.3* 9.5 10.9*  NEUTROABS 7.0  --   --   --   --   --   HGB 12.5 13.6 12.3 12.0 12.9 12.4  HCT 35.5* 39.8 36.4 35.9* 39.3 37.6  MCV 90.6 92.8 95.5 96.2 95.6 94.0  PLT 330 387 331 324 298 328   Cardiac Enzymes: No results for input(s): "CKTOTAL", "CKMB", "CKMBINDEX", "TROPONINI" in the last 168 hours. BNP: Invalid input(s): "POCBNP" CBG: No results for input(s): "GLUCAP" in the last 168 hours. D-Dimer No results for input(s): "DDIMER" in the last 72 hours. Hgb A1c No results for input(s): "HGBA1C" in the last 72 hours. Lipid Profile No results for input(s): "CHOL", "HDL", "LDLCALC", "TRIG", "CHOLHDL", "LDLDIRECT" in the last 72 hours. Thyroid function studies No results for input(s): "TSH", "T4TOTAL", "T3FREE", "THYROIDAB" in the last 72 hours.  Invalid input(s): "FREET3" Anemia work up No results for input(s): "VITAMINB12", "FOLATE", "FERRITIN", "TIBC", "IRON", "RETICCTPCT" in the last 72 hours. Urinalysis    Component Value Date/Time   COLORURINE STRAW (A) 10/12/2023 1315   APPEARANCEUR CLEAR 10/12/2023 1315   LABSPEC 1.004 (L) 10/12/2023 1315   PHURINE 7.0 10/12/2023 1315   GLUCOSEU NEGATIVE 10/12/2023 1315   HGBUR NEGATIVE 10/12/2023 1315   BILIRUBINUR NEGATIVE 10/12/2023 1315   BILIRUBINUR negative 08/24/2022 0941   KETONESUR 20 (A) 10/12/2023 1315   PROTEINUR NEGATIVE 10/12/2023 1315   UROBILINOGEN 0.2 08/24/2022 0941   UROBILINOGEN 0.2 08/18/2009 1235   NITRITE NEGATIVE 10/12/2023 1315   LEUKOCYTESUR NEGATIVE 10/12/2023 1315   Sepsis Labs Recent Labs  Lab 10/14/23 0608 10/15/23 0249 10/16/23 0539 10/17/23 0429  WBC 9.7 11.3* 9.5 10.9*   Microbiology Recent Results (from the past 240 hours)  MRSA Next Gen by PCR, Nasal     Status: None   Collection Time: 10/12/23  8:31 PM   Specimen: Nasal Mucosa; Nasal Swab  Result Value Ref Range  Status   MRSA by PCR Next Gen NOT DETECTED NOT DETECTED Final    Comment: (NOTE) The GeneXpert MRSA Assay (FDA approved for NASAL specimens only), is one component of a comprehensive MRSA colonization surveillance program. It is not intended to diagnose MRSA infection nor to guide or monitor treatment for MRSA infections. Test performance is not FDA approved in patients less than 72 years old. Performed at [facility], [address], [place], [place] [address]   Gastrointestinal Panel by PCR , Stool     Status: None  Collection Time: 10/13/23  6:58 AM   Specimen: Stool  Result Value Ref Range Status   Campylobacter species NOT DETECTED NOT DETECTED Final   Plesimonas shigelloides NOT DETECTED NOT DETECTED Final   Salmonella species NOT DETECTED NOT DETECTED Final   Yersinia enterocolitica NOT DETECTED NOT DETECTED Final   Vibrio species NOT DETECTED NOT DETECTED Final   Vibrio cholerae NOT DETECTED NOT DETECTED Final   Enteroaggregative E coli (EAEC) NOT DETECTED NOT DETECTED Final   Enteropathogenic E coli (EPEC) NOT DETECTED NOT DETECTED Final   Enterotoxigenic E coli (ETEC) NOT DETECTED NOT DETECTED Final   Shiga like toxin producing E coli (STEC) NOT DETECTED NOT DETECTED Final   Shigella/Enteroinvasive E coli (EIEC) NOT DETECTED NOT DETECTED Final   Cryptosporidium NOT DETECTED NOT DETECTED Final   Cyclospora cayetanensis NOT DETECTED NOT DETECTED Final   Entamoeba histolytica NOT DETECTED NOT DETECTED Final   Giardia lamblia NOT DETECTED NOT DETECTED Final   Adenovirus F40/41 NOT DETECTED NOT DETECTED Final   Astrovirus NOT DETECTED NOT DETECTED Final   Norovirus GI/GII NOT DETECTED NOT DETECTED Final   Rotavirus A NOT DETECTED NOT DETECTED Final   Sapovirus (I, II, IV, and V) NOT DETECTED NOT DETECTED Final    Comment: Performed at [facility], [address] [place], [place] [address]  C Difficile Quick Screen w PCR reflex     Status: None   Collection Time:  10/13/23 11:05 AM   Specimen: STOOL  Result Value Ref Range Status   C Diff antigen NEGATIVE NEGATIVE Final   C Diff toxin NEGATIVE NEGATIVE Final   C Diff interpretation No C. difficile detected.  Final    Comment: Performed at [facility], [address] [place], [place] [address]   Time coordinating discharge:  37 mins   SIGNED:  Standley Dakins, MD  [facility] 10/17/2023, 12:47 PM How to contact the [facility] Attending or Consulting provider 7A - 7P or covering provider during after hours 7P -7A, for this patient?  Check the care team in [facility] and look for a) attending/consulting [facility] provider listed and b) the [facility] team listed Log into [email or web address] and use [place]'s universal password to access. If you do not have the password, please contact the hospital operator. Locate the [facility] provider you are looking for under [facility] and page to a number that you can be directly reached. If you still have difficulty reaching the provider, please page the [facility] (Director on Call) for the Hospitalists listed on amion for assistance.

## 2023-10-17 NOTE — Telephone Encounter (Signed)
Nurses I had a thorough conversation with the patient regarding her recent hospital stay She will be seeing Eber Jones on Friday  We need to move forward with a couple referrals and orders Referral-[place] behavioral counselor with psychology as well as consultation with psychiatrist for anxiety and depression  Lab orders-lipase, liver, magnesium, metabolic 7, CBC stat Friday morning-diagnosis abdominal pain and hypokalemia  The patient is aware to come by the office early Friday morning around 9 AM and pick up the papers to go to the lab to do her blood work then she will come back to see Eber Jones at 1020  It should also be noted that I told the patient that after she sees Eber Jones we would arrange for her to follow-up with me approximately 2 to 3 weeks further down the road to recheck her (in my opinion she would benefit from frequent office visits until her issues are stabilized)  A copy this message also being forwarded to [facility] for her review-thanks-Dr. Lorin Picket

## 2023-10-17 NOTE — Plan of Care (Signed)
Patient ID: KAMDYN COVEL, female   DOB: Aug 31, 1976, 48 y.o.   MRN: [id number]  Problem: Education: Goal: Knowledge of General Education information will improve Description: Including pain rating scale, medication(s)/side effects and non-pharmacologic comfort measures Outcome: Adequate for Discharge   Problem: Health Behavior/Discharge Planning: Goal: Ability to manage health-related needs will improve Outcome: Adequate for Discharge   Problem: Clinical Measurements: Goal: Ability to maintain clinical measurements within normal limits will improve Outcome: Adequate for Discharge Goal: Will remain free from infection Outcome: Adequate for Discharge Goal: Diagnostic test results will improve Outcome: Adequate for Discharge Goal: Respiratory complications will improve Outcome: Adequate for Discharge Goal: Cardiovascular complication will be avoided Outcome: Adequate for Discharge   Problem: Activity: Goal: Risk for activity intolerance will decrease Outcome: Adequate for Discharge   Problem: Nutrition: Goal: Adequate nutrition will be maintained Outcome: Adequate for Discharge   Problem: Coping: Goal: Level of anxiety will decrease Outcome: Adequate for Discharge   Problem: Elimination: Goal: Will not experience complications related to bowel motility Outcome: Adequate for Discharge Goal: Will not experience complications related to urinary retention Outcome: Adequate for Discharge   Problem: Pain Managment: Goal: General experience of comfort will improve and/or be controlled Outcome: Adequate for Discharge   Problem: Safety: Goal: Ability to remain free from injury will improve Outcome: Adequate for Discharge   Problem: Skin Integrity: Goal: Risk for impaired skin integrity will decrease Outcome: Adequate for Discharge    Lidia Collum, RN

## 2023-10-17 NOTE — Progress Notes (Signed)
Subjective: Patient reports not feeling too good today. States abdominal pain is improved some but has not gone away. Has had 1 BM this morning that was really loose but not watery. Denies any BRB Or melena noted in her stools. Still having a little nausea but no vomiting. She was able to eat some breakfast this morning with an egg and cheese mcmuffin, some peaches and some potatoes and felt okay after she ate.   Objective: Vital signs in last 24 hours: Temp:  [97.5 F (36.4 C)-98.9 F (37.2 C)] 97.5 F (36.4 C) (02/12 0531) Pulse Rate:  [65-88] 68 (02/12 0531) Resp:  [12-20] 20 (02/12 0531) BP: (110-133)/(74-98) 110/74 (02/12 0531) SpO2:  [98 %-100 %] 100 % (02/12 0820) Last BM Date : 10/15/23 General:   Alert and oriented, pleasant Head:  Normocephalic and atraumatic. Eyes:  No icterus, sclera clear. Conjuctiva pink.  Mouth:  Without lesions, mucosa pink and moist.  Heart:  S1, S2 present, no murmurs noted.  Lungs: Clear to auscultation bilaterally, without wheezing, rales, or rhonchi.  Abdomen:  Bowel sounds present, soft, non-tender, non-distended. No HSM or hernias noted. No rebound or guarding. No masses appreciated  Neurologic:  Alert and  oriented x4;  grossly normal neurologically. Skin:  Warm and dry, intact without significant lesions.  Psych:  Alert and cooperative. Normal mood and affect.  Intake/Output from previous day: 02/11 0701 - 02/12 0700 In: 600 [I.V.:600] Out: 300 [Urine:300] Intake/Output this shift: No intake/output data recorded.  Lab Results: Recent Labs    10/15/23 0249 10/16/23 0539 10/17/23 0429  WBC 11.3* 9.5 10.9*  HGB 12.0 12.9 12.4  HCT 35.9* 39.3 37.6  PLT 324 298 328   BMET Recent Labs    10/15/23 0249 10/16/23 0539 10/17/23 0429  NA 137 134* 135  K 5.0 4.1 3.7  CL 108 106 104  CO2 22 20* 22  GLUCOSE 77 94 73  BUN 6 7 7   CREATININE 0.71 0.76 0.72  CALCIUM 8.8* 8.9 8.6*    Assessment: 48 year old female history of  anxiety, depression, gastroparesis, GERD, IBS, hypothyroidism, hypertension, iron deficiency anemia who presented this admission with worsening diarrhea, abdominal pain and nausea.  Recent CT abdomen/pelvis prior to admission without acute findings.  Ultrasound done and patient with absent gallbladder, normal CBD, hepatic steatosis  C. difficile and GI pathogen panel negative Fasting a.m. cortisol normal Thyroid panel normal Celiac panel negative  Patient has had some improvement with supportive measures (PPI twice daily, dicyclomine scheduled, carafate scheduled), she notes that dicyclomine and carafate seem to be providing some improvement for her. Was able to tolerate breakfast this morning, still having some epigastric pain but improved from previously. Looser stool this morning but no watery stools.   she underwent EGD and colonoscopy yesterday which also showed no acute findings, pathology was unremarkable. I did discuss pathology results with the patient during our encounter.  Further labs to include C1 esterase inhibitor, C4 complement, porphyrins fractionated are pending.  Will follow for pending serologies which may take a few days to result but would lean more towards etiology being functional in this case. I discussed this in depth with the patient as well as my recommendations to start low dose TCA in addition to her buspar to help with her symptoms, to which she was amenable to.   At this time, patient is stable for discharge from GI standpoint, with close outpatient GI and PCP follow up.    Plan: Continue PPI twice daily Continue Bentyl before  meals and at bedtime Follow for pending labs Start Elavil 10mg  at bedtime    LOS: 5 days    10/17/2023, 8:56 AM  Dynastee Brummell L. Jeanmarie Hubert, MSN, APRN, AGNP-C Adult-Gerontology Nurse Practitioner [facility] at [facility]

## 2023-10-17 NOTE — Discharge Instructions (Signed)
IMPORTANT INFORMATION: PAY CLOSE ATTENTION   PHYSICIAN DISCHARGE INSTRUCTIONS  Follow with Primary care provider  Babs Sciara, MD  and other consultants as instructed by your Hospitalist Physician  SEEK MEDICAL CARE OR RETURN TO EMERGENCY ROOM IF SYMPTOMS COME BACK, WORSEN OR NEW PROBLEM DEVELOPS   Please note: You were cared for by a hospitalist during your hospital stay. Every effort will be made to forward records to your primary care provider.  You can request that your primary care provider send for your hospital records if they have not received them.  Once you are discharged, your primary care physician will handle any further medical issues. Please note that NO REFILLS for any discharge medications will be authorized once you are discharged, as it is imperative that you return to your primary care physician (or establish a relationship with a primary care physician if you do not have one) for your post hospital discharge needs so that they can reassess your need for medications and monitor your lab values.  Please get a complete blood count and chemistry panel checked by your Primary MD at your next visit, and again as instructed by your Primary MD.  Get Medicines reviewed and adjusted: Please take all your medications with you for your next visit with your Primary MD  Laboratory/radiological data: Please request your Primary MD to go over all hospital tests and procedure/radiological results at the follow up, please ask your primary care provider to get all Hospital records sent to his/her office.  In some cases, they will be blood work, cultures and biopsy results pending at the time of your discharge. Please request that your primary care provider follow up on these results.  If you are diabetic, please bring your blood sugar readings with you to your follow up appointment with primary care.    Please call and make your follow up appointments as soon as possible.    Also Note  the following: If you experience worsening of your admission symptoms, develop shortness of breath, life threatening emergency, suicidal or homicidal thoughts you must seek medical attention immediately by calling 911 or calling your MD immediately  if symptoms less severe.  You must read complete instructions/literature along with all the possible adverse reactions/side effects for all the Medicines you take and that have been prescribed to you. Take any new Medicines after you have completely understood and accpet all the possible adverse reactions/side effects.   Do not drive when taking Pain medications or sleeping medications (Benzodiazepines)  Do not take more than prescribed Pain, Sleep and Anxiety Medications. It is not advisable to combine anxiety,sleep and pain medications without talking with your primary care practitioner  Special Instructions: If you have smoked or chewed Tobacco  in the last 2 yrs please stop smoking, stop any regular Alcohol  and or any Recreational drug use.  Wear Seat belts while driving.  Do not drive if taking any narcotic, mind altering or controlled substances or recreational drugs or alcohol.

## 2023-10-18 ENCOUNTER — Telehealth: Payer: Self-pay | Admitting: *Deleted

## 2023-10-18 ENCOUNTER — Other Ambulatory Visit: Payer: Self-pay

## 2023-10-18 ENCOUNTER — Encounter (INDEPENDENT_AMBULATORY_CARE_PROVIDER_SITE_OTHER): Payer: Self-pay | Admitting: *Deleted

## 2023-10-18 ENCOUNTER — Encounter: Payer: Self-pay | Admitting: Family Medicine

## 2023-10-18 DIAGNOSIS — R1013 Epigastric pain: Secondary | ICD-10-CM

## 2023-10-18 DIAGNOSIS — E876 Hypokalemia: Secondary | ICD-10-CM

## 2023-10-18 DIAGNOSIS — F341 Dysthymic disorder: Secondary | ICD-10-CM

## 2023-10-18 LAB — PORPHYRINS, FRACTIONATION-PLASMA

## 2023-10-18 LAB — C4 COMPLEMENT: Complement C4, Body Fluid: 18 mg/dL (ref 12–38)

## 2023-10-18 LAB — C1 ESTERASE INHIBITOR: C1INH SerPl-mCnc: 25 mg/dL (ref 21–39)

## 2023-10-18 LAB — COMPLEMENT, TOTAL: Compl, Total (CH50): 44 U/mL (ref >41)

## 2023-10-18 NOTE — Telephone Encounter (Signed)
Referrals and lab work has been put in

## 2023-10-18 NOTE — Transitions of Care (Post Inpatient/ED Visit) (Signed)
   10/18/2023  Name: Alicia Owens MRN: [id number] DOB: 14-Sep-1975  Today's TOC FU Call Status: Today's TOC FU Call Status:: Unsuccessful Call (1st Attempt) Unsuccessful Call (1st Attempt) Date: 10/18/23  Attempted to reach the patient regarding the most recent Inpatient/ED visit.  Follow Up Plan: Additional outreach attempts will be made to reach the patient to complete the Transitions of Care (Post Inpatient/ED visit) call.   Irving Shows [facility], BSN RN Care Manager/ Transition of Care [place]/ [facility] [phone]

## 2023-10-19 ENCOUNTER — Other Ambulatory Visit (HOSPITAL_COMMUNITY)
Admission: RE | Admit: 2023-10-19 | Discharge: 2023-10-19 | Disposition: A | Discharge status: Home / Self Care | Payer: 59 | Source: Ambulatory Visit | Attending: Family Medicine | Admitting: Family Medicine

## 2023-10-19 ENCOUNTER — Encounter: Payer: Self-pay | Admitting: Nurse Practitioner

## 2023-10-19 ENCOUNTER — Ambulatory Visit: Payer: 59 | Admitting: Nurse Practitioner

## 2023-10-19 ENCOUNTER — Telehealth: Payer: Self-pay | Admitting: *Deleted

## 2023-10-19 VITALS — BP 137/80 | HR 92 | Temp 97.3°F | Ht 61.5 in | Wt 155.0 lb

## 2023-10-19 DIAGNOSIS — R1013 Epigastric pain: Secondary | ICD-10-CM | POA: Insufficient documentation

## 2023-10-19 DIAGNOSIS — F411 Generalized anxiety disorder: Secondary | ICD-10-CM | POA: Diagnosis not present

## 2023-10-19 DIAGNOSIS — E876 Hypokalemia: Secondary | ICD-10-CM | POA: Insufficient documentation

## 2023-10-19 LAB — HEPATIC FUNCTION PANEL
ALT: 22 U/L (ref 0–44)
AST: 18 U/L (ref 15–41)
Albumin: 4.1 g/dL (ref 3.5–5.0)
Alkaline Phosphatase: 59 U/L (ref 38–126)
Bilirubin, Direct: 0.1 mg/dL (ref 0.0–0.2)
Indirect Bilirubin: 0.5 mg/dL (ref 0.3–0.9)
Total Bilirubin: 0.6 mg/dL (ref 0.0–1.2)
Total Protein: 7.1 g/dL (ref 6.5–8.1)

## 2023-10-19 LAB — CBC WITH DIFFERENTIAL/PLATELET
Abs Immature Granulocytes: 0.04 10*3/uL (ref 0.00–0.07)
Basophils Absolute: 0 10*3/uL (ref 0.0–0.1)
Basophils Relative: 0 %
Eosinophils Absolute: 0.1 10*3/uL (ref 0.0–0.5)
Eosinophils Relative: 1 %
HCT: 40.1 % (ref 36.0–46.0)
Hemoglobin: 13.6 g/dL (ref 12.0–15.0)
Immature Granulocytes: 1 %
Lymphocytes Relative: 18 %
Lymphs Abs: 1.4 10*3/uL (ref 0.7–4.0)
MCH: 31.9 pg (ref 26.0–34.0)
MCHC: 33.9 g/dL (ref 30.0–36.0)
MCV: 94.1 fL (ref 80.0–100.0)
Monocytes Absolute: 0.4 10*3/uL (ref 0.1–1.0)
Monocytes Relative: 5 %
Neutro Abs: 5.9 10*3/uL (ref 1.7–7.7)
Neutrophils Relative %: 75 %
Platelets: 340 10*3/uL (ref 150–400)
RBC: 4.26 MIL/uL (ref 3.87–5.11)
RDW: 12.4 % (ref 11.5–15.5)
WBC: 7.8 10*3/uL (ref 4.0–10.5)
nRBC: 0 % (ref 0.0–0.2)

## 2023-10-19 LAB — PORPHYRINS, FRACTIONATED URINE (TIMED COLLECTION)

## 2023-10-19 LAB — BASIC METABOLIC PANEL
Anion gap: 10 (ref 5–15)
BUN: 5 mg/dL — ABNORMAL LOW (ref 6–20)
CO2: 21 mmol/L — ABNORMAL LOW (ref 22–32)
Calcium: 9 mg/dL (ref 8.9–10.3)
Chloride: 106 mmol/L (ref 98–111)
Creatinine, Ser: 0.74 mg/dL (ref 0.44–1.00)
GFR, Estimated: >60 mL/min (ref >60)
Glucose, Bld: 151 mg/dL — ABNORMAL HIGH (ref 70–99)
Potassium: 4.2 mmol/L (ref 3.5–5.1)
Sodium: 137 mmol/L (ref 135–145)

## 2023-10-19 LAB — MAGNESIUM: Magnesium: 1.8 mg/dL (ref 1.7–2.4)

## 2023-10-19 LAB — LIPASE, BLOOD: Lipase: 35 U/L (ref 11–51)

## 2023-10-19 MED ORDER — CLONAZEPAM 0.5 MG PO TABS: ORAL_TABLET | ORAL | 0 refills | Status: DC

## 2023-10-19 MED ORDER — FLUOXETINE HCL 10 MG PO CAPS: 10.0000 mg | ORAL_CAPSULE | Freq: Every day | ORAL | 0 refills | Status: DC

## 2023-10-19 NOTE — Transitions of Care (Post Inpatient/ED Visit) (Signed)
   10/19/2023  Name: Alicia Owens MRN: [id number] DOB: [birth year]  Today's TOC FU Call Status: Today's TOC FU Call Status:: Unsuccessful Call (2nd Attempt) Unsuccessful Call (2nd Attempt) Date: 10/19/23  Attempted to reach the patient regarding the most recent Inpatient/ED visit.  Follow Up Plan: Additional outreach attempts will be made to reach the patient to complete the Transitions of Care (Post Inpatient/ED visit) call.   Irving Shows [facility], BSN RN Care Manager/ Transition of Care [place]/ [facility] [phone]

## 2023-10-19 NOTE — Patient Instructions (Signed)
Stop Xanax Start Klonopin (Clonazepam) Stop Amitriptyline Start Prozac  Continue Buspar

## 2023-10-20 NOTE — Telephone Encounter (Signed)
Noted. See office note 10/19/23

## 2023-10-21 ENCOUNTER — Encounter: Payer: Self-pay | Admitting: Nurse Practitioner

## 2023-10-21 NOTE — Progress Notes (Unsigned)
Subjective:    Patient ID: Alicia Owens, female    DOB: 11-08-1975, 48 y.o.   MRN: [id number]  HPI Presents for complaints of extreme anxiety that began at the end of the year.  Denies any specific trauma or problems she thinks triggered the events.  Has had some abdominal problems during this time as well.  Just had colonoscopy with EGD on 10/16/2023.  Patient states all the testing has been done have not shown an issue.  Describes pain as a bad ache or tightness mainly in the right upper quadrant into the epigastric area.  Describes as a debilitating pain at times.  Some relief with use of Carafate.  PMH also includes hysterectomy and cholecystectomy.  Patient had stat blood work done this morning that was ordered by Dr. Lorin Picket. States she is overwhelmed with anxiety.  Denies any suicidal or homicidal thoughts or ideation.  Denies any self-harm behaviors.  States the recent addition of BuSpar has helped some.  Has been prescribed alprazolam 0.25 mg in the past.  Was also placed on amitriptyline to see if this would help her anxiety as well as some of her abdominal pain. Started this 2 days ago.  Denies smoking, vaping or drug use.  Very rare alcohol use.  Part of her anxiety is due to the large amount of medical bills that she is still trying to pay off.   Review of Systems  Constitutional:  Positive for fatigue.  Respiratory:  Negative for cough, chest tightness and shortness of breath.   Cardiovascular:  Negative for chest pain.  Gastrointestinal:  Positive for abdominal pain and diarrhea. Negative for blood in stool and constipation.  Psychiatric/Behavioral:  Positive for sleep disturbance. Negative for self-injury and suicidal ideas. The patient is nervous/anxious.       10/19/2023   10:29 AM  Depression screen PHQ 2/9  Decreased Interest 2  Down, Depressed, Hopeless 1  PHQ - 2 Score 3  Altered sleeping 1  Tired, decreased energy 1  Change in appetite 1  Feeling bad or failure about  yourself  1  Trouble concentrating 1  Moving slowly or fidgety/restless 0  Suicidal thoughts 0  PHQ-9 Score 8  Difficult doing work/chores Very difficult      10/19/2023   10:29 AM 08/22/2023    8:56 AM 05/18/2023    9:08 AM 09/07/2022   11:46 AM  GAD 7 : Generalized Anxiety Score  Nervous, Anxious, on Edge 2 0 0 0  Control/stop worrying 2 0 0 0  Worry too much - different things 2 0 0 0  Trouble relaxing 2 0 0 0  Restless 1 0 0 0  Easily annoyed or irritable 1 0 0 0  Afraid - awful might happen 2 0 0 0  Total GAD 7 Score 12 0 0 0  Anxiety Difficulty Very difficult Not difficult at all Not difficult at all Not difficult at all         Objective:   Physical Exam NAD.  Alert, oriented.  Very anxious affect.  Slight tremors.  Crying at times.  Speech clear.  Making good eye contact.  Thoughts logical coherent and relevant.  Judgment normal.  Dressed appropriately for the weather.  Lungs clear.  Heart regular rate rhythm.  Results for orders placed or performed during the hospital encounter of 10/19/23  Lipase, blood   Collection Time: 10/19/23  9:49 AM  Result Value Ref Range   Lipase 35 11 - 51 U/L  Hepatic  function panel   Collection Time: 10/19/23  9:49 AM  Result Value Ref Range   Total Protein 7.1 6.5 - 8.1 g/dL   Albumin 4.1 3.5 - 5.0 g/dL   AST 18 15 - 41 U/L   ALT 22 0 - 44 U/L   Alkaline Phosphatase 59 38 - 126 U/L   Total Bilirubin 0.6 0.0 - 1.2 mg/dL   Bilirubin, Direct 0.1 0.0 - 0.2 mg/dL   Indirect Bilirubin 0.5 0.3 - 0.9 mg/dL  Basic metabolic panel   Collection Time: 10/19/23  9:49 AM  Result Value Ref Range   Sodium 137 135 - 145 mmol/L   Potassium 4.2 3.5 - 5.1 mmol/L   Chloride 106 98 - 111 mmol/L   CO2 21 (L) 22 - 32 mmol/L   Glucose, Bld 151 (H) 70 - 99 mg/dL   BUN 5 (L) 6 - 20 mg/dL   Creatinine, Ser 0.98 0.44 - 1.00 mg/dL   Calcium 9.0 8.9 - 11.9 mg/dL   GFR, Estimated >14 >78 mL/min   Anion gap 10 5 - 15  CBC with Differential/Platelet    Collection Time: 10/19/23  9:49 AM  Result Value Ref Range   WBC 7.8 4.0 - 10.5 K/uL   RBC 4.26 3.87 - 5.11 MIL/uL   Hemoglobin 13.6 12.0 - 15.0 g/dL   HCT 29.5 62.1 - 30.8 %   MCV 94.1 80.0 - 100.0 fL   MCH 31.9 26.0 - 34.0 pg   MCHC 33.9 30.0 - 36.0 g/dL   RDW 65.7 84.6 - 96.2 %   Platelets 340 150 - 400 K/uL   nRBC 0.0 0.0 - 0.2 %   Neutrophils Relative % 75 %   Neutro Abs 5.9 1.7 - 7.7 K/uL   Lymphocytes Relative 18 %   Lymphs Abs 1.4 0.7 - 4.0 K/uL   Monocytes Relative 5 %   Monocytes Absolute 0.4 0.1 - 1.0 K/uL   Eosinophils Relative 1 %   Eosinophils Absolute 0.1 0.0 - 0.5 K/uL   Basophils Relative 0 %   Basophils Absolute 0.0 0.0 - 0.1 K/uL   Immature Granulocytes 1 %   Abs Immature Granulocytes 0.04 0.00 - 0.07 K/uL  Magnesium   Collection Time: 10/19/23  9:49 AM  Result Value Ref Range   Magnesium 1.8 1.7 - 2.4 mg/dL   Today's Vitals   95/28/41 1013 10/19/23 1131  BP: (!) 150/103 137/80  Pulse: (!) 108 92  Temp: (!) 97.3 F (36.3 C)   SpO2: 100%   Weight: 155 lb (70.3 kg)   Height: 5' 1.5" (1.562 m)    Body mass index is 28.81 kg/m.      Assessment & Plan:   Problem List Items Addressed This Visit       Other   Generalized anxiety disorder - Primary   Relevant Medications   FLUoxetine (PROZAC) 10 MG capsule   Other Relevant Orders   Ambulatory referral to Psychiatry   Ambulatory referral to Psychology   Meds ordered this encounter  Medications   clonazePAM (KLONOPIN) 0.5 MG tablet    Sig: Take 1/2 - 1 po BID prn severe anxiety    Dispense:  30 tablet    Refill:  0    Supervising Provider:   Lilyan Punt A [[id number]]   FLUoxetine (PROZAC) 10 MG capsule    Sig: Take 1 capsule (10 mg total) by mouth daily.    Dispense:  30 capsule    Refill:  0    Supervising Provider:  Lilyan Punt A [[id number]]   Reviewed labs with patient during visit.  The previous abnormalities are now corrected. Lengthy discussion regarding the effects of mental health on  her physical health and how this could be adding to her abdominal pain/symptoms.  Continue pantoprazole sucralfate and dicyclomine as directed for abdominal symptoms.  Recommend daily probiotic. Stop Xanax and switch to clonazepam especially since this is longer acting.  Drowsiness precautions. Because of the extreme nature of her symptoms, feel that amitriptyline is not the best choice because of side effects at higher doses.  Start fluoxetine low-dose with plans to titrate if tolerated and needed.  Continue BuSpar as directed.  Urgent referrals placed to psychiatry and counseling.  Patient verbally agrees to seek help immediately if any suicidal thoughts or ideation.  Reviewed potential adverse effects of fluoxetine, discontinue medication and contact office if any problems. Return in about 2 weeks (around 11/02/2023).

## 2023-10-22 ENCOUNTER — Telehealth: Payer: Self-pay | Admitting: *Deleted

## 2023-10-22 NOTE — Transitions of Care (Post Inpatient/ED Visit) (Signed)
   10/22/2023  Name: Alicia Owens MRN: [id number] DOB: 1976/07/21  Today's TOC FU Call Status: Today's TOC FU Call Status:: Unsuccessful Call (3rd Attempt) Unsuccessful Call (3rd Attempt) Date: 10/22/23  Attempted to reach the patient regarding the most recent Inpatient/ED visit.  Follow Up Plan: No further outreach attempts will be made at this time. We have been unable to contact the patient.  Irving Shows [facility], BSN RN Care Manager/ Transition of Care [place]/ [facility] [phone]

## 2023-10-24 LAB — PORPHYRINS, FRACTIONATED URINE (TIMED COLLECTION)
Copropor(CP)III,24hr: 50 ug/(24.h) (ref 0–74)
Coproporph(CP)I,24hr: 17 ug/(24.h) (ref 0–24)
Coproporphyrin I: 5 ug/L
Coproporphyrin III: 15 ug/L
Heptacarboxyl (7-CP): <1 ug/L
Heptacarboxyl Porphyrin, 24H Ur: <3 ug/(24.h) (ref 0–4)
Hexacarboxyl (6-CP): <1 ug/L
Hexacarboxyporphyrin: <3 ug/(24.h) — ABNORMAL HIGH (ref 0–1)
Pentacarboxyl (5-CP): <1 ug/L
Pentacarboxyporphyrin: <3 ug/(24.h) (ref 0–4)
Total Volume: 3350
Uroporphyrin, Urine: 13 ug/(24.h) (ref 0–24)
Uroporphyrins (UP): 4 ug/L

## 2023-10-31 ENCOUNTER — Encounter (HOSPITAL_COMMUNITY): Payer: Self-pay

## 2023-10-31 SURGERY — ESOPHAGOGASTRODUODENOSCOPY (EGD) WITH PROPOFOL
Anesthesia: Choice

## 2023-11-02 ENCOUNTER — Ambulatory Visit: Payer: 59 | Admitting: Nurse Practitioner

## 2023-11-02 ENCOUNTER — Encounter: Payer: Self-pay | Admitting: Nurse Practitioner

## 2023-11-02 VITALS — BP 132/78 | HR 78 | Temp 98.8°F | Ht 61.5 in | Wt 150.6 lb

## 2023-11-02 DIAGNOSIS — F341 Dysthymic disorder: Secondary | ICD-10-CM | POA: Diagnosis not present

## 2023-11-02 DIAGNOSIS — R1013 Epigastric pain: Secondary | ICD-10-CM

## 2023-11-02 DIAGNOSIS — K580 Irritable bowel syndrome with diarrhea: Secondary | ICD-10-CM | POA: Diagnosis not present

## 2023-11-02 DIAGNOSIS — R600 Localized edema: Secondary | ICD-10-CM

## 2023-11-02 DIAGNOSIS — K219 Gastro-esophageal reflux disease without esophagitis: Secondary | ICD-10-CM | POA: Diagnosis not present

## 2023-11-02 MED ORDER — CLONAZEPAM 0.5 MG PO TABS: ORAL_TABLET | ORAL | 0 refills | Status: DC

## 2023-11-02 MED ORDER — LIDOCAINE VISCOUS HCL 2 % MT SOLN: 30.0000 mL | Freq: Two times a day (BID) | OROMUCOSAL | 0 refills | Status: DC | PRN

## 2023-11-02 MED ORDER — FLUOXETINE HCL 20 MG PO TABS: 20.0000 mg | ORAL_TABLET | Freq: Every day | ORAL | 0 refills | Status: DC

## 2023-11-03 ENCOUNTER — Encounter: Payer: Self-pay | Admitting: Nurse Practitioner

## 2023-11-03 NOTE — Progress Notes (Addendum)
 Subjective:    Patient ID: Alicia Owens, female    DOB: [birth year], 48 y.o.   MRN: [id number]  HPI Presents for routine follow-up, see previous note.  Has seen some slight improvement in her anxiety and depression symptoms.  Still very anxious and tearful.  Currently on BuSpar 15 mg twice daily and fluoxetine 10 mg daily.  Has not heard back regarding her referral for counseling and psychiatric consults.  Taking her clonazepam half tab in the morning and 1 at nighttime which does help take the edge off of her anxiety.  Denies suicidal or homicidal thoughts or ideation. Continues to have upper abdominal pain in the epigastric area to the edge of the right upper quadrant.  No early satiety.  Has been careful with her diet.  Mainly drinks water.  Currently on pantoprazole twice daily.  Back in January when patient went to the emergency department she was given a GI cocktail which seemed to help her symptoms more than anything she has tried.  Is not currently using her sucralfate.  Has tried Bentyl for cramping with slight relief.  Has an appointment with her GI specialist on 3/13.  Continues to have IBS diarrhea, no bloody stools or fever.  Has missed work recently due to her mental health issues and frequent diarrhea. Has noticed a slight increase in lower extremity edema since stopping her HCTZ during her hospitalization in early February with severe hypokalemia. Social History   Tobacco Use   Smoking status: Never   Smokeless tobacco: Never  Vaping Use   Vaping status: Never Used  Substance Use Topics   Alcohol use: No   Drug use: No     Review of Systems  Constitutional:  Positive for fatigue.  Respiratory:  Negative for cough, chest tightness, shortness of breath and wheezing.   Cardiovascular:  Positive for leg swelling. Negative for chest pain.       No orthopnea.  Gastrointestinal:  Positive for abdominal pain, diarrhea and nausea. Negative for blood in stool, constipation and  vomiting.  Psychiatric/Behavioral:  Positive for dysphoric mood and sleep disturbance. Negative for self-injury and suicidal ideas. The patient is nervous/anxious.       11/02/2023   11:25 AM 10/19/2023   10:29 AM 08/22/2023    8:56 AM 05/18/2023    9:07 AM 01/30/2023    9:42 AM  Depression screen PHQ 2/9  Decreased Interest 2 2 0 0 0  Down, Depressed, Hopeless 2 1 0 0 0  PHQ - 2 Score 4 3 0 0 0  Altered sleeping 1 1 1 1    Tired, decreased energy 2 1 1 2    Change in appetite 1 1 0 0   Feeling bad or failure about yourself  1 1 0 0   Trouble concentrating 1 1 1 1    Moving slowly or fidgety/restless 1 0 0 0   Suicidal thoughts 0 0 0 0   PHQ-9 Score 11 8 3 4    Difficult doing work/chores Very difficult Very difficult Not difficult at all Not difficult at all       11/02/2023   11:25 AM 10/19/2023   10:29 AM 08/22/2023    8:56 AM 05/18/2023    9:08 AM  GAD 7 : Generalized Anxiety Score  Nervous, Anxious, on Edge 2 2 0 0  Control/stop worrying 2 2 0 0  Worry too much - different things 2 2 0 0  Trouble relaxing 1 2 0 0  Restless 1 1 0  0  Easily annoyed or irritable 1 1 0 0  Afraid - awful might happen 1 2 0 0  Total GAD 7 Score 10 12 0 0  Anxiety Difficulty Very difficult Very difficult Not difficult at all Not difficult at all         Objective:   Physical Exam NAD.  Alert, oriented.  Tearful during most of the exam.  Making good eye contact.  Mildly anxious affect.  Fatigued in appearance.  Speech clear.  Normal judgment.  Thoughts logical coherent and relevant.  Dressed appropriately for the weather.  Lungs clear.  Heart regular rate rhythm.  Abdomen soft nondistended with distinct localized tenderness in the epigastric area and slightly into the right upper quadrant.  No obvious masses.  No rebound or guarding.  Mild generalized lower abdominal tenderness.  Lower extremities trace pitting edema. Today's Vitals   11/02/23 1034  BP: 132/78  Pulse: 78  Temp: 98.8 F (37.1 C)   SpO2: 98%  Weight: 150 lb 9.6 oz (68.3 kg)  Height: 5' 1.5" (1.562 m)   Body mass index is 27.99 kg/m.       Assessment & Plan:   Problem List Items Addressed This Visit       Digestive   GERD   Relevant Medications   GI Cocktail (alum & mag hydroxide-simethicone/lidocaine)oral mixture   IBS (irritable bowel syndrome)   Relevant Medications   GI Cocktail (alum & mag hydroxide-simethicone/lidocaine)oral mixture     Other   Abdominal pain, epigastric   ANXIETY DEPRESSION - Primary   Relevant Medications   FLUoxetine (PROZAC) 20 MG tablet   Pedal edema   Meds ordered this encounter  Medications   GI Cocktail (alum & mag hydroxide-simethicone/lidocaine)oral mixture    Sig: Take 30 mLs by mouth 2 (two) times daily as needed. For abdominal pain    Dispense:  180 mL    Refill:  0    Supervising Provider:   Lilyan Punt A [[id number]]   FLUoxetine (PROZAC) 20 MG tablet    Sig: Take 1 tablet (20 mg total) by mouth daily.    Dispense:  30 tablet    Refill:  0    Supervising Provider:   Lilyan Punt A [[id number]]   clonazePAM (KLONOPIN) 0.5 MG tablet    Sig: Take 1/2 tab po in the morning, 1/2 tab po in the afternoon and one po at bedtime    Dispense:  30 tablet    Refill:  0    Please note new dosing regimen. Thanks.    Supervising Provider:   Babs Sciara [id number]   Prescription sent in for GI cocktail with instructions.  Continue pantoprazole and Bentyl as directed. OTC bowel probiotics such as Align as directed.  Follow-up with GI specialist as planned. Increase fluoxetine to 20 mg daily.  Reviewed potential adverse effects.  Discontinue medication and contact office if any major side effects. Increase Klonopin dose to half tab in the morning, half tab in the afternoon and 1 p.o. at bedtime.  Patient understands this is a temporary measure due to the severity of her symptoms. Will send message to staff to check on her referrals. Work note given for the next 2 weeks. Return in  about 2 weeks (around 11/16/2023) for Work note from today until next office visit. Call back sooner if needed.

## 2023-11-05 ENCOUNTER — Other Ambulatory Visit: Payer: Self-pay | Admitting: Nurse Practitioner

## 2023-11-05 MED ORDER — LIDOCAINE VISCOUS HCL 2 % MT SOLN: 30.0000 mL | Freq: Two times a day (BID) | OROMUCOSAL | 0 refills | Status: AC | PRN

## 2023-11-06 ENCOUNTER — Encounter: Payer: Self-pay | Admitting: Nurse Practitioner

## 2023-11-07 ENCOUNTER — Ambulatory Visit (HOSPITAL_COMMUNITY): Admit: 2023-11-07 | Payer: 59 | Admitting: Gastroenterology

## 2023-11-15 ENCOUNTER — Encounter (INDEPENDENT_AMBULATORY_CARE_PROVIDER_SITE_OTHER): Payer: Self-pay | Admitting: Gastroenterology

## 2023-11-15 ENCOUNTER — Ambulatory Visit (INDEPENDENT_AMBULATORY_CARE_PROVIDER_SITE_OTHER): Payer: 59 | Admitting: Gastroenterology

## 2023-11-15 VITALS — BP 103/63 | HR 62 | Temp 97.5°F | Ht 61.5 in | Wt 147.0 lb

## 2023-11-15 DIAGNOSIS — R1084 Generalized abdominal pain: Secondary | ICD-10-CM | POA: Diagnosis not present

## 2023-11-15 DIAGNOSIS — K580 Irritable bowel syndrome with diarrhea: Secondary | ICD-10-CM

## 2023-11-15 DIAGNOSIS — R197 Diarrhea, unspecified: Secondary | ICD-10-CM

## 2023-11-15 NOTE — Progress Notes (Signed)
 Katrinka Blazing, M.D. Gastroenterology & Hepatology [facility] [address] [place], [place] [address]  Primary Care Physician: Babs Sciara, MD [address] [place] [address]  I will communicate my assessment and recommendations to the referring MD via EMR.  Problems: Recurrent abdominal pain, diarrhea and nausea with vomiting, likely functional in nature History of gastroparesis  History of Present Illness: BRIGHTEN BUZZELLI is a 48 y.o. female with a history of anxiety, depression, gastroparesis, GERD, IBS, hypothyroidism, hypertension, iron-deficiency anemia, who presents for follow up of diarrhea, abdominal pain, nausea and vomiting.  The patient was last seen while she was hospitalized at [facility] in early February 2025.  Patient was presenting worsening symptoms of nausea, vomiting, abdominal pain and diarrhea.  She had the following evaluations:  Cdiff and GIP negative. Fasting am cortisol normal. TSH and T4 normal. TTG IgA negative.  C1 esterase inhibitor was normal 25, C4 was normal 18.  Also had EGD and colonoscopy performed on 10/16/2023, EGD showed normal esophagus, stomach and duodenum.  Biopsies of stomach and small bowel were completely normal.  Colonoscopy showed normal terminal ileum, normal colon (normal random biopsies).  Recommended repeat colonoscopy in 10 years.  Porphyrins and urine were ordered and these were negative.  Patient was discharged home on Elavil 20 mg at bedtime, Bentyl every 6 hours and PPI twice daily.  She is currently taking Bentyl 3-4 times a day, as well as Protonix BID.  Patient was seen by Sherie Don for evaluation after her hospitalization.It was decided to start her on clonazepam and to stop Elavil, and switched this for fluoxetine.  She had her dosage of fluoxetine increased on a subsequent visit to the clinic.  She was referred to psychiatry clinic but has not seen them  yet.  Patient reports that overall, she has presented some improvement since she left the hospital. States that she has had recurrent issues with diarrhea, which is watery in nature - having up to 10 Bms per day. No melena or hematochezia. Takes 1-3 Imodium tablets per day. Diarrhea has worsened since Tuesday.  Patient is still having abdominal pain in her upper, but moves to the lower abdomen. Pain is intermittent, but sometimes is present during the day.  She is still having some nausea but no vomiting. Has been taking Zofran.  The patient denies having any nausea, vomiting, fever, chills, hematochezia, melena, hematemesis, jaundice, pruritus or weight loss.  Last EGD: as above Last Colonoscopy: as above  Past Medical History: Past Medical History:  Diagnosis Date   Anemia    Anxiety    Chronic abdominal pain    Depression    Gastroparesis    GERD (gastroesophageal reflux disease)    Headache(784.0)    History of abdominal hysterectomy 09/03/2022   Ovaries removed as well.  04/10/2022[place] [place]   Hypertension    Hyperthyroidism 2011   IBS (irritable bowel syndrome)    Iron deficiency anemia, unspecified 10/05/2019   More than likely this is due to heavy cycles.  Patient has ongoing history of heavy cycles.  IFBOT - January 2021   Menorrhagia 10/05/2019   PONV (postoperative nausea and vomiting)    PP care - C/S 12/17 08/22/2011    Past Surgical History: Past Surgical History:  Procedure Laterality Date   APPENDECTOMY     BASAL CELL CARCINOMA EXCISION  02/2006   face   BIOPSY  10/16/2023   Procedure: BIOPSY;  Surgeon:  Dolores Frame, MD;  Location: [facility];  Service: Gastroenterology;;   BREAST SURGERY     Breast reduction   CESAREAN SECTION  2007 and 2012   x2   CHOLECYSTECTOMY  12/15/2011   Procedure: LAPAROSCOPIC CHOLECYSTECTOMY;  Surgeon: Dalia Heading, MD;  Location: [facility];  Service: General;  Laterality: N/A;   COLONOSCOPY      COLONOSCOPY WITH PROPOFOL N/A 10/16/2023   Procedure: COLONOSCOPY WITH PROPOFOL;  Surgeon: Dolores Frame, MD;  Location: [facility];  Service: Gastroenterology;  Laterality: N/A;   ESOPHAGOGASTRODUODENOSCOPY     ESOPHAGOGASTRODUODENOSCOPY (EGD) WITH PROPOFOL N/A 10/16/2023   Procedure: ESOPHAGOGASTRODUODENOSCOPY (EGD) WITH PROPOFOL;  Surgeon: Dolores Frame, MD;  Location: [facility];  Service: Gastroenterology;  Laterality: N/A;  abdominal pain, nausea, vomiting, diarrhea   TUBAL LIGATION     with last c-section   WISDOM TOOTH EXTRACTION  08/2003    Family History: Family History  Problem Relation Age of Onset   Healthy Daughter    Healthy Son    Hypertension Mother    Hypertension Father    Hyperlipidemia Father    Hyperlipidemia Other    Hypertension Other    Anesthesia problems Neg Hx    Hypotension Neg Hx    Malignant hyperthermia Neg Hx    Pseudochol deficiency Neg Hx     Social History: Social History   Tobacco Use  Smoking Status Never  Smokeless Tobacco Never   Social History   Substance and Sexual Activity  Alcohol Use No   Social History   Substance and Sexual Activity  Drug Use No    Allergies: Allergies  Allergen Reactions   Ciprofloxacin     Severe nausea and vomiting when combined with flagyl but not by itself   Codeine     REACTION: GI Upset, Vomiting   Flagyl [Metronidazole]     Severe nausea and vomiting   Promethazine Hcl     REACTION: Throat felt strange   Reglan [Metoclopramide]     twitching   Sulfonamide Derivatives     REACTION: Rash    Medications: Current Outpatient Medications  Medication Sig Dispense Refill   acetaminophen (TYLENOL) 500 MG tablet Take 500-1,000 mg by mouth every 6 (six) hours as needed for moderate pain (pain score 4-6).     budesonide (PULMICORT FLEXHALER) 180 MCG/ACT inhaler Inhale 1 puff into the lungs in the morning and at bedtime. (Patient taking differently: Inhale 1-2  puffs into the lungs daily.) 1 each 5   busPIRone (BUSPAR) 15 MG tablet TAKE ONE TABLET BY MOUTH TWICE A DAY 180 tablet 1   clonazePAM (KLONOPIN) 0.5 MG tablet Take 1/2 tab po in the morning, 1/2 tab po in the afternoon and one po at bedtime 30 tablet 0   CRANBERRY PO Take 1 tablet by mouth daily.     cyclobenzaprine (FLEXERIL) 10 MG tablet TAKE ONE (1) TABLET BY MOUTH 3 TIMES DAILY AS NEEDED (BETWEEN MEALS AND AT BEDTIME) 30 tablet 5   dicyclomine (BENTYL) 20 MG tablet TAKE ONE TABLET BY MOUTH THREE TIMES DAILY AS NEEDED OR AS DIRECTED 90 tablet 5   estradiol (VIVELLE-DOT) 0.1 MG/24HR patch Place 1 patch (0.1 mg total) onto the skin 2 (two) times a week. 8 patch 2   fexofenadine (ALLEGRA) 180 MG tablet Take 180 mg by mouth daily.     FIBER PO Take 1 Dose by mouth daily.     FLUoxetine (PROZAC) 20 MG tablet Take 1 tablet (20  mg total) by mouth daily. 30 tablet 0   gabapentin (NEURONTIN) 100 MG capsule 2 qhs 60 capsule 5   GI Cocktail (alum & mag hydroxide-simethicone/lidocaine)oral mixture Take 30 mLs by mouth 2 (two) times daily as needed. For abdominal pain 180 mL 0   Ginger, Zingiber officinalis, (GINGER PO) Take by mouth. 2 daily     Lactobacillus-Inulin (CULTURELLE DIGESTIVE HEALTH) CAPS Take 1 capsule by mouth daily.     montelukast (SINGULAIR) 10 MG tablet TAKE ONE (1) TABLET BY MOUTH EVERY DAY (Patient taking differently: Take 10 mg by mouth at bedtime.) 90 tablet 1   Multiple Vitamins-Minerals (MULTIVITAMIN WITH MINERALS) tablet Take 1 tablet by mouth daily.     ondansetron (ZOFRAN) 4 MG tablet Take 1 tablet (4 mg total) by mouth every 8 (eight) hours as needed for nausea or vomiting. 12 tablet 0   pantoprazole (PROTONIX) 40 MG tablet TAKE ONE TABLET BY MOUTH TWICE A DAY 60 tablet 2   sucralfate (CARAFATE) 1 g tablet Take 1 tablet (1 g total) by mouth 4 (four) times daily -  with meals and at bedtime. Dissolve tablet in 1/2 of water, mix and drink slurry. 56 tablet 1   SUMAtriptan  (IMITREX) 100 MG tablet Take 1 tablet (100 mg total) by mouth every 2 (two) hours as needed for headache or migraine.     SUPER B COMPLEX/C PO Take 1 tablet by mouth daily.      topiramate (TOPAMAX) 50 MG tablet TAKE 3 TABLETS BY MOUTH IN THE EVENING 270 tablet 1   Triamcinolone Acetonide (NASACORT AQ NA) Place 1 spray into the nose 2 (two) times daily as needed (Rhinitis).     TURMERIC PO Take 1 capsule by mouth daily.      Ubrogepant (UBRELVY) 50 MG TABS Take one tab po at onset of migraine; may repeat dose x 1 after 2 hours if needed 30 tablet 2   valsartan (DIOVAN) 80 MG tablet Take 1 tablet (80 mg total) by mouth daily. (Patient taking differently: Take 40 mg by mouth daily.) 30 tablet 1   VITAMIN D, CHOLECALCIFEROL, PO Take 1 tablet by mouth daily.     No current facility-administered medications for this visit.    Review of Systems: GENERAL: negative for malaise, night sweats HEENT: No changes in hearing or vision, no nose bleeds or other nasal problems. NECK: Negative for lumps, goiter, pain and significant neck swelling RESPIRATORY: Negative for cough, wheezing CARDIOVASCULAR: Negative for chest pain, leg swelling, palpitations, orthopnea GI: SEE HPI MUSCULOSKELETAL: Negative for joint pain or swelling, back pain, and muscle pain. SKIN: Negative for lesions, rash PSYCH: Negative for sleep disturbance, mood disorder and recent psychosocial stressors. HEMATOLOGY Negative for prolonged bleeding, bruising easily, and swollen nodes. ENDOCRINE: Negative for cold or heat intolerance, polyuria, polydipsia and goiter. NEURO: negative for tremor, gait imbalance, syncope and seizures. The remainder of the review of systems is noncontributory.   Physical Exam: BP 103/63   Pulse 62   Temp (!) 97.5 F (36.4 C)   Ht 5' 1.5" (1.562 m)   Wt 147 lb (66.7 kg)   LMP 07/29/2021   BMI 27.33 kg/m  GENERAL: The patient is AO x3, in no acute distress. HEENT: Head is normocephalic and  atraumatic. EOMI are intact. Mouth is well hydrated and without lesions. NECK: Supple. No masses LUNGS: Clear to auscultation. No presence of rhonchi/wheezing/rales. Adequate chest expansion HEART: RRR, normal s1 and s2. ABDOMEN: Tender to palpation diffusely, no guarding, no peritoneal signs, and  nondistended. BS +. No masses. EXTREMITIES: Without any cyanosis, clubbing, rash, lesions or edema. NEUROLOGIC: AOx3, no focal motor deficit. SKIN: no jaundice, no rashes  Imaging/Labs: as above  I personally reviewed and interpreted the available labs, imaging and endoscopic files.  Impression and Plan: ALEXY BRINGLE is a 48 y.o. female with a history of anxiety, depression, gastroparesis, GERD, IBS, hypothyroidism, hypertension, iron-deficiency anemia, who presents for follow up of diarrhea, abdominal pain, nausea and vomiting.  Patient had persistent and worsening symptoms of abdominal pain, nausea, vomiting and diarrhea which required recent hospitalization and extensive workup including cross-sectional abdominal imaging, endoscopic procedures, and multiple serologies that have been all unremarkable.  It is considered that her symptoms were likely related to functional disorder, for which she was started on Elavil.  However, on follow-up at her PCPs office, this was discontinued and was switched for fluoxetine and clonazepam.  I discussed with the patient that as she has presented worsening diarrhea, this could be side effect from fluoxetine.  Fluoxetine is a treatment option for functional disorders but it may cause diarrhea, while low-dose TCA's may decrease bowel movement frequency with strong evidence as a second line treatment for functional disorders.  I will reach her PCP to discuss this further as I will rather have her on low-dose amitriptyline then on fluoxetine.  For now, I advised the patient to continue dicyclomine as needed for abdominal pain, Imodium as needed for diarrhea and Zofran  as needed for nausea and vomiting.  Since she does not have any ongoing issues with GERD, it would be adequate to decrease her pantoprazole to 40 mg every day and to stop Carafate for now.  She can go back to the previous doses if her symptoms were to worsen.  I also discussed with the patient the possibility of pursuing a capsule endoscopy to evaluate other potential etiologies of her symptoms including IBD but she would like to hold off on this for now.  Will check a fecal calprotectin for now.  -Continue dicyclomine as needed for abdominal pain -Continue Imodium as needed for diarrhea -Continue Zofran as needed for nausea and vomiting -Will discuss with PCP possibility of switching to a low-dose TCA instead of SSRI -Decrease pantoprazole to 40 mg every day -Stop sucralfate -Check fecal calprotectin -If interested in pursuing capsule endoscopy, please give [place] a call to schedule this -If interested in pursuing a gastric emptying study, please call us to schedule this.  Will need to stop Zofran and dicyclomine for a week before the test is performed.  All questions were answered.      Katrinka Blazing, MD Gastroenterology and Hepatology [facility]

## 2023-11-15 NOTE — Patient Instructions (Addendum)
 Continue dicyclomine as needed for abdominal pain Continue Imodium as needed for diarrhea Continue Zofran as needed for nausea and vomiting Perform stool workup Will discuss with PCP possibility of switching to a low-dose TCA instead of SSRI Decrease pantoprazole to 40 mg every day Stop sucralfate Check fecal calprotectin If interested in pursuing capsule endoscopy, please give [place] a call to schedule this If interested in pursuing a gastric emptying study, please call us to schedule this.  Will need to stop Zofran and dicyclomine for a week before the test is performed.

## 2023-11-16 ENCOUNTER — Encounter: Payer: Self-pay | Admitting: Nurse Practitioner

## 2023-11-16 ENCOUNTER — Encounter (INDEPENDENT_AMBULATORY_CARE_PROVIDER_SITE_OTHER): Payer: Self-pay | Admitting: Gastroenterology

## 2023-11-16 ENCOUNTER — Other Ambulatory Visit: Payer: Self-pay | Admitting: Nurse Practitioner

## 2023-11-16 ENCOUNTER — Ambulatory Visit: Payer: 59 | Admitting: Nurse Practitioner

## 2023-11-16 VITALS — BP 117/76 | HR 80 | Temp 98.6°F | Ht 61.5 in | Wt 149.0 lb

## 2023-11-16 DIAGNOSIS — E876 Hypokalemia: Secondary | ICD-10-CM | POA: Diagnosis not present

## 2023-11-16 DIAGNOSIS — E8809 Other disorders of plasma-protein metabolism, not elsewhere classified: Secondary | ICD-10-CM | POA: Diagnosis not present

## 2023-11-16 DIAGNOSIS — R748 Abnormal levels of other serum enzymes: Secondary | ICD-10-CM | POA: Diagnosis not present

## 2023-11-16 MED ORDER — FLUOXETINE HCL 10 MG PO TABS: 10.0000 mg | ORAL_TABLET | Freq: Every day | ORAL | 0 refills | Status: DC

## 2023-11-16 MED ORDER — CLONAZEPAM 0.5 MG PO TABS: ORAL_TABLET | ORAL | 0 refills | Status: DC

## 2023-11-16 NOTE — Progress Notes (Signed)
 Subjective:    Patient ID: Alicia Owens, female    DOB: 10-20-1975, 48 y.o.   MRN: [id number]  HPI Presents for follow-up see previous notes.  Has been seeing some improvement on fluoxetine 20 mg.  Currently taking her clonazepam and buspirone as directed.  Having some improvement until the last 2 days, saw a flareup with crying spells and frequent diarrhea.  This seemed to be triggered by her son's illness which tends to start flaring up her emotional issues.  Saw Dr. Levon Hedger yesterday for her IBS and GI problems.  States today she feels much better less emotional and has only had 1 bowel movement today.  Has been taking her OTC probiotics. Contacted behavioral health for psychiatry and counseling, states they were booked up and was placed on a wait list.  Note that patient works for [employer]. Denies any suicidal thoughts or ideation.   Review of Systems  Constitutional:  Positive for fatigue. Negative for fever.  Respiratory:  Negative for cough, chest tightness and shortness of breath.   Cardiovascular:  Negative for chest pain.  Gastrointestinal:  Positive for abdominal pain and diarrhea.  Psychiatric/Behavioral:  Positive for dysphoric mood and sleep disturbance. Negative for suicidal ideas. The patient is nervous/anxious.        11/16/2023    1:20 PM 11/02/2023   11:25 AM 10/19/2023   10:29 AM 08/22/2023    8:56 AM 05/18/2023    9:07 AM  Depression screen PHQ 2/9  Decreased Interest 2 2 2  0 0  Down, Depressed, Hopeless 2 2 1  0 0  PHQ - 2 Score 4 4 3  0 0  Altered sleeping 2 1 1 1 1   Tired, decreased energy 2 2 1 1 2   Change in appetite 1 1 1  0 0  Feeling bad or failure about yourself  2 1 1  0 0  Trouble concentrating 1 1 1 1 1   Moving slowly or fidgety/restless 0 1 0 0 0  Suicidal thoughts 0 0 0 0 0  PHQ-9 Score 12 11 8 3 4   Difficult doing work/chores Somewhat difficult Very difficult Very difficult Not difficult at all Not difficult at all      11/16/2023    1:20 PM  11/02/2023   11:25 AM 10/19/2023   10:29 AM 08/22/2023    8:56 AM  GAD 7 : Generalized Anxiety Score  Nervous, Anxious, on Edge 1 2 2  0  Control/stop worrying 1 2 2  0  Worry too much - different things 2 2 2  0  Trouble relaxing 1 1 2  0  Restless 1 1 1  0  Easily annoyed or irritable 1 1 1  0  Afraid - awful might happen 1 1 2  0  Total GAD 7 Score 8 10 12  0  Anxiety Difficulty Somewhat difficult Very difficult Very difficult Not difficult at all  Minimal change in her PHQ and GAD scores since last visit but no worsening symptoms.      Objective:   Physical Exam NAD.  Alert, oriented.  Much calmer than last 2 visits.  Making good eye contact.  Speech clear.  No crying noted during visit.  Dressed appropriately for the weather.  Mood and judgment normal.  Thoughts logical coherent and relevant.  Lungs clear.  Heart regular rate rhythm. Today's Vitals   11/16/23 1319  BP: 117/76  Pulse: 80  Temp: 98.6 F (37 C)  SpO2: 100%  Weight: 149 lb (67.6 kg)  Height: 5' 1.5" (1.562 m)  Body mass index is 27.7 kg/m.        Assessment & Plan:   Problem List Items Addressed This Visit       Other   Elevated liver enzymes   Relevant Orders   Hepatic function panel   Hypoalbuminemia   Relevant Orders   Hepatic function panel   Hypokalemia - Primary   Relevant Orders   Basic metabolic panel   Hypomagnesemia   Relevant Orders   Magnesium   Meds ordered this encounter  Medications   FLUoxetine (PROZAC) 10 MG tablet    Sig: Take 1 tablet (10 mg total) by mouth daily.    Dispense:  30 tablet    Refill:  0    Supervising Provider:   Lilyan Punt A [[id number]]   clonazePAM (KLONOPIN) 0.5 MG tablet    Sig: Take 1/2 tab po in the morning, 1/2 tab po in the afternoon and one po at bedtime    Dispense:  30 tablet    Refill:  0    Please note new dosing regimen. Thanks.    Supervising Provider:   Babs Sciara [id number]   Due to excessively low potassium and magnesium about a month  ago, patient request repeat labs.  Will repeat these 1 more time and if normal no further intervention is needed.  Note that those levels were very low during an ED visit. Continue buspirone as directed.  Continue Klonopin at current dose. Add fluoxetine 10 mg to her current 20 mg dose for a total of 30 mg once daily. 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 While patient is showing some slight improvement, still feel that she needs more time off work at this point, 2-week work excuse given. Return in about 2 weeks (around 11/30/2023) for Work excuse for 2 weeks. Call back sooner if any problems.  Patient to seek help immediately if any suicidal thoughts or ideation.

## 2023-11-17 ENCOUNTER — Encounter: Payer: Self-pay | Admitting: Nurse Practitioner

## 2023-11-19 ENCOUNTER — Encounter: Payer: Self-pay | Admitting: Family Medicine

## 2023-11-20 ENCOUNTER — Other Ambulatory Visit (INDEPENDENT_AMBULATORY_CARE_PROVIDER_SITE_OTHER): Payer: Self-pay | Admitting: Gastroenterology

## 2023-11-20 MED ORDER — SUCRALFATE 1 G PO TABS: 1.0000 g | ORAL_TABLET | Freq: Three times a day (TID) | ORAL | 1 refills | Status: DC

## 2023-11-22 LAB — CALPROTECTIN: Calprotectin: 521 ug/g — ABNORMAL HIGH

## 2023-11-26 ENCOUNTER — Telehealth (INDEPENDENT_AMBULATORY_CARE_PROVIDER_SITE_OTHER): Payer: Self-pay | Admitting: Gastroenterology

## 2023-11-26 ENCOUNTER — Encounter (INDEPENDENT_AMBULATORY_CARE_PROVIDER_SITE_OTHER): Payer: Self-pay

## 2023-11-26 NOTE — Telephone Encounter (Signed)
 Please see my chart message from 11/26/23  No PA needed per [facility]. Pt contacted and scheduled for 12/12/23 at 8:30am. Instructions sent to pt via my chart

## 2023-11-30 ENCOUNTER — Encounter: Payer: Self-pay | Admitting: Nurse Practitioner

## 2023-11-30 ENCOUNTER — Ambulatory Visit: Admitting: Nurse Practitioner

## 2023-11-30 VITALS — BP 114/68 | HR 85 | Temp 97.3°F | Ht 61.5 in | Wt 146.0 lb

## 2023-11-30 DIAGNOSIS — F341 Dysthymic disorder: Secondary | ICD-10-CM

## 2023-11-30 DIAGNOSIS — L509 Urticaria, unspecified: Secondary | ICD-10-CM

## 2023-11-30 NOTE — Patient Instructions (Addendum)
 Floragen Take Allegra daily Take Pepcid twice a day  Hives Hives are itchy, red, swollen areas on your skin. They can show up on any part of your body. They often go away within 24 hours (acute hives). If you get new hives after the old ones fade and this goes on for many days or weeks, it is called chronic hives. Hives do not spread from person to person (are not contagious). Hives can happen when your body reacts to something that you are allergic to (allergen). These are sometimes called triggers. You can get hives right after being around a trigger, or hours later. What are the causes? Food allergies. Insect bites or stings. Allergies to pollen or pets. Spending time in sunlight, heat, or cold. Exercise. Stress. Other causes, such as: Viruses. This includes the common cold. Infections caused by germs (bacteria). Some medicines. Chemicals or latex. Allergy shots. Blood transfusions. In some cases, the cause is not known. What increases the risk? Being female. Being allergic to foods, such as: Citrus fruits. Milk. Eggs. Peanuts. Tree nuts. Shellfish. Being allergic to: Medicines. Latex. Insects. Animals. Pollen. What are the signs or symptoms?  Itchy, red or white bumps or spots on your skin. These areas may: Swell and get bigger. Change in shape and location. Stand alone or connect to each other over a large area of skin. Sting or hurt. Turn white when pressed in the center (blanch). In very bad cases, your hands, feet, and face may also swell. This may happen if hives start deeper in your skin. How is this treated? Treatment for hives depends on your symptoms. You may need to: Use cool, wet cloths (cool compresses) or take cool showers to stop the itching. Take or apply medicines to: Help with itching (antihistamines). Lessen swelling (corticosteroids). Treat infection (antibiotics). Have a medicine called omalizumab given to you as a shot. You may need this  if your hives do not get better with other treatments. In very bad cases, you may need to use a device filled with medicine that gives an emergency shot of epinephrine (auto-injector pen) to stop a very bad allergic reaction (anaphylactic reaction). Follow these instructions at home: Medicines Take or apply over-the-counter and prescription medicines only as told by your doctor. If you were prescribed antibiotics, use them as told by your doctor. Do not stop using them even if you start to feel better. Skin care Put cool, wet cloths on the hives. Do not scratch your skin. Do not rub your skin. General instructions Do not take hot showers or baths. This can make itching worse. Do not wear tight clothes. Use sunscreen. Wear clothes that cover your skin when you are outside. Avoid triggers that cause your hives. Keep a journal to help track what causes your hives. Write down: What medicines you take. What you eat and drink. What you put on your skin. Keep all follow-up visits. Your doctor will need to make sure treatment is working. Contact a doctor if: Your symptoms do not get better with medicine. Your joints hurt or swell. You have a fever. You have pain in your belly (abdomen). Get help right away if: Your tongue or lips swell. Your eyelids are swollen. Your chest or throat feels tight. You have trouble breathing or swallowing. These symptoms may be an emergency. Get help right away. Call 911. Do not wait to see if the symptoms will go away. Do not drive yourself to the hospital. This information is not intended to replace advice  given to you by your health care provider. Make sure you discuss any questions you have with your health care provider. Document Revised: 05/09/2022 Document Reviewed: 05/09/2022 Elsevier Patient Education  2024 [employer].

## 2023-12-01 ENCOUNTER — Encounter: Payer: Self-pay | Admitting: Nurse Practitioner

## 2023-12-01 MED ORDER — FLUOXETINE HCL 10 MG PO TABS: 10.0000 mg | ORAL_TABLET | Freq: Every day | ORAL | 0 refills | Status: DC

## 2023-12-01 MED ORDER — CLONAZEPAM 0.5 MG PO TABS: ORAL_TABLET | ORAL | 0 refills | Status: DC

## 2023-12-01 MED ORDER — FLUOXETINE HCL 20 MG PO CAPS: ORAL_CAPSULE | ORAL | 0 refills | Status: DC

## 2023-12-01 NOTE — Progress Notes (Signed)
 Subjective:    Patient ID: Alicia Owens, female    DOB: [birth year], 48 y.o.   MRN: [id number]  HPI Presents for recheck, see previous notes.  Doing well on current total dose of Prozac 30 mg and BuSpar daily.  Denies any adverse effects.  Denies any suicidal thoughts or ideation.  Tries to take her clonazepam on a as needed basis, does not always take the half tab afternoon dose.  Continues to have off-and-on diarrhea and cramping.  Had a flareup of pain yesterday followed by several episodes of diarrhea and cramping this morning.  This tends to come and go.  Is being followed by local GI specialist.  Will be having a capsule procedure on 12/12/2023.  There is a question of whether she has Crohn's disease.  Most of her problems began with increasing stress related to her grandmother's illness and then her sons repeated illnesses.  Has not identified any specific foods related to her GI symptoms.  Does take a daily probiotic.  Difficult for her to perform her job mainly due to the sudden abdominal cramping and diarrhea.  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 Patient also mention she has recently developed a recurrent migratory rash.  Has pictures on her phone of confluent moderately erythema patches of hives on her upper back.  Also has had them on her arms.  States she will start itching and when she scratches the area whelps will come up.  Resolves on its own after a period of time.  Denies any difficulty breathing or swallowing.  Has not identified any specific triggers.  Review of Systems  Constitutional:  Positive for fatigue.  HENT:  Negative for trouble swallowing.   Respiratory:  Negative for cough, chest tightness, shortness of breath and wheezing.   Cardiovascular:  Negative for chest pain.  Gastrointestinal:  Positive for abdominal pain and diarrhea.  Psychiatric/Behavioral:  Positive for sleep disturbance. Negative for suicidal ideas. The patient is  nervous/anxious.       11/30/2023    9:09 AM 11/16/2023    1:20 PM 11/02/2023   11:25 AM 10/19/2023   10:29 AM 08/22/2023    8:56 AM  Depression screen PHQ 2/9  Decreased Interest 1 2 2 2  0  Down, Depressed, Hopeless 1 2 2 1  0  PHQ - 2 Score 2 4 4 3  0  Altered sleeping 1 2 1 1 1   Tired, decreased energy 2 2 2 1 1   Change in appetite 1 1 1 1  0  Feeling bad or failure about yourself  1 2 1 1  0  Trouble concentrating 1 1 1 1 1   Moving slowly or fidgety/restless 0 0 1 0 0  Suicidal thoughts 0 0 0 0 0  PHQ-9 Score 8 12 11 8 3   Difficult doing work/chores Somewhat difficult Somewhat difficult Very difficult Very difficult Not difficult at all       11/30/2023    9:09 AM 11/16/2023    1:20 PM 11/02/2023   11:25 AM 10/19/2023   10:29 AM  GAD 7 : Generalized Anxiety Score  Nervous, Anxious, on Edge 1 1 2 2   Control/stop worrying 1 1 2 2   Worry too much - different things 1 2 2 2   Trouble relaxing 1 1 1 2   Restless 0 1 1 1   Easily annoyed or irritable 1 1 1 1   Afraid - awful might happen 1 1 1 2   Total GAD 7 Score 6 8  10 12  Anxiety Difficulty Somewhat difficult Somewhat difficult Very difficult Very difficult   Social History   Tobacco Use   Smoking status: Never   Smokeless tobacco: Never  Vaping Use   Vaping status: Never Used  Substance Use Topics   Alcohol use: No   Drug use: No         Objective:   Physical Exam NAD.  Alert, oriented.  Mildly fatigued in appearance.  Calm affect.  Speech clear.  Making good eye contact.  Dressed appropriately for the weather.  Thoughts logical coherent and relevant.  Normal judgment and behavior.  Lungs clear.  Heart regular rate rhythm.  Skin is clear at this time. Today's Vitals   11/30/23 0901  BP: 114/68  Pulse: 85  Temp: (!) 97.3 F (36.3 C)  SpO2: 100%  Weight: 146 lb (66.2 kg)  Height: 5' 1.5" (1.562 m)   Body mass index is 27.14 kg/m.       Assessment & Plan:   Problem List Items Addressed This Visit        Musculoskeletal and Integument   Urticaria     Other   ANXIETY DEPRESSION - Primary   Relevant Medications   FLUoxetine (PROZAC) 10 MG tablet   FLUoxetine (PROZAC) 20 MG capsule   Meds ordered this encounter  Medications   clonazePAM (KLONOPIN) 0.5 MG tablet    Sig: Take 1/2 tab po in the morning, 1/2 tab po in the afternoon and one po at bedtime    Dispense:  60 tablet    Refill:  0    Supervising Provider:   Lilyan Punt A [[id number]]   FLUoxetine (PROZAC) 10 MG tablet    Sig: Take 1 tablet (10 mg total) by mouth daily.    Dispense:  90 tablet    Refill:  0    NOTE: PATIENT IS ON 20 MG + 10 MG FOR TOTAL DAILY DOSE OF 30 MG. THANKS.    Supervising Provider:   Lilyan Punt A [[id number]]   FLUoxetine (PROZAC) 20 MG capsule    Sig: Take one cap po every day    Dispense:  90 capsule    Refill:  0    NOTE: PATIENT IS ON 20 MG + 10 MG FOR TOTAL DAILY DOSE OF 30 MG. THANKS.    Supervising Provider:   Lilyan Punt A [[id number]]   Continue fluoxetine and BuSpar as directed.  Continue to use clonazepam as directed as needed.  Patient's goal is to eventually wean off this medication once she is more stable.  Again encouraged patient to check into mental health services such as counseling available through her employer due to cost savings and availability. Discussed diagnosis of urticaria which is most likely related to her stress.  Discussed potential causes and warning signs.  Recommend that she: Take Allegra daily Take Pepcid twice a day Return in about 1 month (around 12/31/2023). Work excuse given from now until she completes her capsule study.  Will reevaluate at that time to see if we need to extend this.

## 2023-12-04 ENCOUNTER — Ambulatory Visit (INDEPENDENT_AMBULATORY_CARE_PROVIDER_SITE_OTHER): Payer: 59 | Admitting: Gastroenterology

## 2023-12-05 ENCOUNTER — Other Ambulatory Visit: Payer: Self-pay | Admitting: Family Medicine

## 2023-12-11 ENCOUNTER — Encounter: Payer: Self-pay | Admitting: Nurse Practitioner

## 2023-12-12 ENCOUNTER — Ambulatory Visit (HOSPITAL_COMMUNITY)
Admission: RE | Admit: 2023-12-12 | Discharge: 2023-12-12 | Disposition: A | Discharge status: Home / Self Care | Source: Ambulatory Visit | Attending: Gastroenterology | Admitting: Gastroenterology

## 2023-12-12 ENCOUNTER — Encounter (HOSPITAL_COMMUNITY)
Admission: RE | Disposition: A | Discharge status: Home / Self Care | Payer: Self-pay | Source: Ambulatory Visit | Attending: Gastroenterology

## 2023-12-12 DIAGNOSIS — R195 Other fecal abnormalities: Secondary | ICD-10-CM | POA: Insufficient documentation

## 2023-12-12 DIAGNOSIS — R109 Unspecified abdominal pain: Secondary | ICD-10-CM | POA: Diagnosis present

## 2023-12-12 DIAGNOSIS — R194 Change in bowel habit: Secondary | ICD-10-CM | POA: Diagnosis not present

## 2023-12-12 SURGERY — IMAGING PROCEDURE, GI TRACT, INTRALUMINAL, VIA CAPSULE

## 2023-12-13 ENCOUNTER — Encounter (HOSPITAL_COMMUNITY): Payer: Self-pay | Admitting: Gastroenterology

## 2023-12-13 ENCOUNTER — Encounter (INDEPENDENT_AMBULATORY_CARE_PROVIDER_SITE_OTHER): Payer: Self-pay | Admitting: Gastroenterology

## 2023-12-13 NOTE — Procedures (Signed)
 Small Bowel [place] Capsule Study Procedure date: 12/12/2023  PCP:  Dr. Babs Sciara, MD  Indication for procedure: Abdominal pain, change in bowel habits and elevated fecal calprotectin  Findings:  Study was adequate as capsule reached the cecum and the bowel preparation was adequate in the small bowel. No abnormalities were found in the gastrointestinal lining.    First Gastric image:  00:00:41 First Duodenal image: 00:28:41 First Cecal image: 04:52:47 Gastric Passage time: 0h 37m Small Bowel Passage time:  4h 73m  Summary & Recommendations: Capsule endoscopy was completely normal.  There was no presence of inflammation or any other abnormality in the gastrointestinal lining.  no abnormalities indicative of inflammatory bowel disease.  Symptoms are likely related to IBS.  Will continue with clinical management with dicyclomine, Imodium and Zofran as needed.  I personally communicated these recommendations to the patient  Katrinka Blazing, MD Gastroenterology and Hepatology [facility]

## 2023-12-17 ENCOUNTER — Other Ambulatory Visit: Payer: Self-pay | Admitting: Family Medicine

## 2023-12-24 ENCOUNTER — Ambulatory Visit (INDEPENDENT_AMBULATORY_CARE_PROVIDER_SITE_OTHER): Admitting: Gastroenterology

## 2023-12-27 ENCOUNTER — Encounter (INDEPENDENT_AMBULATORY_CARE_PROVIDER_SITE_OTHER): Payer: Self-pay | Admitting: Gastroenterology

## 2023-12-27 ENCOUNTER — Ambulatory Visit (INDEPENDENT_AMBULATORY_CARE_PROVIDER_SITE_OTHER): Admitting: Gastroenterology

## 2023-12-27 VITALS — BP 114/70 | HR 75 | Temp 97.3°F | Ht 61.5 in | Wt 149.4 lb

## 2023-12-27 DIAGNOSIS — K580 Irritable bowel syndrome with diarrhea: Secondary | ICD-10-CM | POA: Diagnosis not present

## 2023-12-27 DIAGNOSIS — R197 Diarrhea, unspecified: Secondary | ICD-10-CM

## 2023-12-27 MED ORDER — RIFAXIMIN 550 MG PO TABS: 550.0000 mg | ORAL_TABLET | Freq: Three times a day (TID) | ORAL | 0 refills | Status: DC

## 2023-12-27 NOTE — Progress Notes (Unsigned)
 Alicia Owens, M.D. Gastroenterology & Hepatology [facility] Gastroenter[facility] [address] [place], [place] [address]  Primary Care Physician: Bennet Brasil, MD [address] [place] [address]  I will communicate my assessment and recommendations to the referring MD via EMR.  Problems: Recurrent abdominal pain, diarrhea and nausea with vomiting, likely functional in nature History of gastroparesis IBS-D   History of Present Illness: Alicia Owens is a 48 y.o. female with a history of anxiety, depression, gastroparesis, GERD, IBS, hypothyroidism, hypertension, iron-deficiency anemia, who presents for follow up of diarrhea, abdominal pain, nausea and vomiting.  The patient was last seen on 11/15/2023. At that time, the patient was advised to continue dicyclomine , Imodium , Zofran .  Also advised to decrease pantoprazole  to 40 mg every day.  Fecal calprotectin came elevated at 521 level.  Due to this, capsule endoscopy was performed on 12/12/2023 with findings described below.  Discussion was held with PCP regarding switching her medications for anxiety for TCA but the patient decided to continue her current anxiolytic instead of trying a new medication.  Patient reports feeling somewhat better compared to prior. States that She reports nausea and vomiting has improved, very rarely has nausea. Has presented 5-6 Bms per day, which are watery in nature but no blood in stool or melena. The other days, she is having 1-2 normal Bms. She takes Imodium  2 tabs when diarrhea is present .  She is taking dicyclomine  20 mg every night at bedtime, which has helped control the abdominal pain. Has to take intermittently dicyclomine  during the day. Pain is motly present in the LLQ and suprapubic area, but sometimes radiates to the upper abdomen. Sometimes she has some bloating episodes, which worsens after she takes Imodium .  The patient denies having any  fever,  chills, hematochezia, melena, hematemesis,  jaundice, pruritus or weight loss.  Previous workup 10/2023 Cdiff and GIP negative. Fasting am cortisol normal. TSH and T4 normal. TTG IgA negative.  C1 esterase inhibitor was normal 25, C4 was normal 18.  Porphyrins and urine were ordered and these were negative.  Fecal calprotectin came elevated at 521.  Last EGD:10/16/2023  normal esophagus, stomach and duodenum.   Last Colonoscopy:10/16/2023  normal terminal ileum, normal colon (normal random biopsies). Recommended repeat colonoscopy in 10 years.   Capsule endoscopy 12/12/2023: Normal capsule endoscopy.  Past Medical History: Past Medical History:  Diagnosis Date   Anemia    Anxiety    Chronic abdominal pain    Depression    Gastroparesis    GERD (gastroesophageal reflux disease)    Headache(784.0)    History of abdominal hysterectomy 09/03/2022   Ovaries removed as well.  04/10/2022-Dr. Gwynn Lesches [place]    Hypertension    Hyperthyroidism 2011   IBS (irritable bowel syndrome)    Iron deficiency anemia, unspecified 10/05/2019   More than likely this is due to heavy cycles.  Patient has ongoing history of heavy cycles.  IFBOT - January 2021   Menorrhagia 10/05/2019   PONV (postoperative nausea and vomiting)    PP care - C/S 12/17 08/22/2011    Past Surgical History: Past Surgical History:  Procedure Laterality Date   APPENDECTOMY     BASAL CELL CARCINOMA EXCISION  02/2006   face   BIOPSY  10/16/2023   Procedure: BIOPSY;  Surgeon: Urban Garden, MD;  Location: [facility];  Service: Gastroenterology;;   BREAST SURGERY     Breast reduction   CESAREAN SECTION  2007  and 2012   x2   CHOLECYSTECTOMY  12/15/2011   Procedure: LAPAROSCOPIC CHOLECYSTECTOMY;  Surgeon: Beau Bound, MD;  Location: [facility];  Service: General;  Laterality: N/A;   COLONOSCOPY     COLONOSCOPY WITH PROPOFOL  N/A 10/16/2023   Procedure: COLONOSCOPY WITH PROPOFOL ;  Surgeon: Urban Garden, MD;  Location: [facility];  Service: Gastroenterology;  Laterality: N/A;   ESOPHAGOGASTRODUODENOSCOPY     ESOPHAGOGASTRODUODENOSCOPY (EGD) WITH PROPOFOL  N/A 10/16/2023   Procedure: ESOPHAGOGASTRODUODENOSCOPY (EGD) WITH PROPOFOL ;  Surgeon: Urban Garden, MD;  [facility]: [facility];  Service: Gastroenterology;  Laterality: N/A;  abdominal pain, nausea, vomiting, diarrhea   GIVENS CAPSULE STUDY N/A 12/12/2023   Procedure: IMAGING PROCEDURE, GI TRACT, INTRALUMINAL, VIA CAPSULE;  Surgeon: Umberto Ganong, Bearl Limes, MD;  Location: [facility];  Service: Gastroenterology;  Laterality: N/A;  8:30AM;GIVENS   TUBAL LIGATION     with last c-section   WISDOM TOOTH EXTRACTION  08/2003    Family History: Family History  Problem Relation Age of Onset   Healthy Daughter    Healthy Son    Hypertension Mother    Hypertension Father    Hyperlipidemia Father    Hyperlipidemia Other    Hypertension Other    Anesthesia problems Neg Hx    Hypotension Neg Hx    Malignant hyperthermia Neg Hx    Pseudochol deficiency Neg Hx     Social History: Social History   Tobacco Use  Smoking Status Never  Smokeless Tobacco Never   Social History   Substance and Sexual Activity  Alcohol Use No   Social History   Substance and Sexual Activity  Drug Use No    Allergies: Allergies  Allergen Reactions   Ciprofloxacin      Severe nausea and vomiting when combined with flagyl  but not by itself   Codeine     REACTION: GI Upset, Vomiting   Flagyl  [Metronidazole ]     Severe nausea and vomiting   Promethazine Hcl     REACTION: Throat felt strange   Reglan  [Metoclopramide ]     twitching   Sulfonamide Derivatives     REACTION: Rash    Medications: Current Outpatient Medications  Medication Sig Dispense Refill   acetaminophen  (TYLENOL ) 500 MG tablet Take 500-1,000 mg by mouth every 6 (six) hours as needed for moderate pain (pain score 4-6).     budesonide  (PULMICORT   FLEXHALER) 180 MCG/ACT inhaler Inhale 1 puff into the lungs in the morning and at bedtime. (Patient taking differently: Inhale 1-2 puffs into the lungs daily.) 1 each 5   busPIRone  (BUSPAR ) 15 MG tablet TAKE ONE TABLET BY MOUTH TWICE A DAY 180 tablet 1   cetirizine (ZYRTEC) 10 MG chewable tablet Chew 10 mg by mouth daily.     clonazePAM  (KLONOPIN ) 0.5 MG tablet Take 1/2 tab po in the morning, 1/2 tab po in the afternoon and one po at bedtime 60 tablet 0   CRANBERRY PO Take 1 tablet by mouth daily.     cyclobenzaprine  (FLEXERIL ) 10 MG tablet TAKE ONE (1) TABLET BY MOUTH 3 TIMES DAILY AS NEEDED (BETWEEN MEALS AND AT BEDTIME) 30 tablet 5   dicyclomine  (BENTYL ) 20 MG tablet TAKE ONE TABLET BY MOUTH THREE TIMES DAILY AS NEEDED OR AS DIRECTED 90 tablet 5   estradiol  (VIVELLE -DOT) 0.1 MG/24HR patch Place 1 patch (0.1 mg total) onto the skin 2 (two) times a week. 8 patch 2   FIBER PO Take 1 Dose by mouth daily.  FLUoxetine  (PROZAC ) 10 MG tablet Take 1 tablet (10 mg total) by mouth daily. 90 tablet 0   FLUoxetine  (PROZAC ) 20 MG capsule Take one cap po every day 90 capsule 0   gabapentin  (NEURONTIN ) 100 MG capsule TAKE TWO CAPSULES BY MOUTH AT BEDTIME 60 capsule 5   GI Cocktail (alum & mag hydroxide-simethicone /lidocaine )oral mixture Take 30 mLs by mouth 2 (two) times daily as needed. For abdominal pain 180 mL 0   Ginger, Zingiber officinalis, (GINGER PO) Take by mouth. 2 daily     Lactobacillus-Inulin (CULTURELLE DIGESTIVE HEALTH) CAPS Take 1 capsule by mouth daily.     montelukast  (SINGULAIR ) 10 MG tablet TAKE ONE (1) TABLET BY MOUTH EVERY DAY 90 tablet 1   Multiple Vitamins-Minerals (MULTIVITAMIN WITH MINERALS) tablet Take 1 tablet by mouth daily.     ondansetron  (ZOFRAN ) 4 MG tablet Take 1 tablet (4 mg total) by mouth every 8 (eight) hours as needed for nausea or vomiting. 12 tablet 0   pantoprazole  (PROTONIX ) 40 MG tablet TAKE ONE TABLET BY MOUTH TWICE A DAY 60 tablet 2   sucralfate  (CARAFATE ) 1 g  tablet Take 1 tablet (1 g total) by mouth 4 (four) times daily -  with meals and at bedtime. Dissolve tablet in 1/2 of water, mix and drink slurry. 56 tablet 1   SUMAtriptan  (IMITREX ) 100 MG tablet Take 1 tablet (100 mg total) by mouth every 2 (two) hours as needed for headache or migraine.     SUPER B COMPLEX/C PO Take 1 tablet by mouth daily.      topiramate  (TOPAMAX ) 50 MG tablet TAKE 3 TABLETS BY MOUTH IN THE EVENING 270 tablet 1   Triamcinolone Acetonide (NASACORT AQ NA) Place 1 spray into the nose 2 (two) times daily as needed (Rhinitis).     TURMERIC PO Take 1 capsule by mouth daily.      Ubrogepant  (UBRELVY ) 50 MG TABS Take one tab po at onset of migraine; may repeat dose x 1 after 2 hours if needed 30 tablet 2   valsartan  (DIOVAN ) 80 MG tablet Take 1 tablet (80 mg total) by mouth daily. (Patient taking differently: Take 40 mg by mouth daily.) 30 tablet 1   VITAMIN D , CHOLECALCIFEROL, PO Take 1 tablet by mouth daily.     No current facility-administered medications for this visit.    Review of Systems: GENERAL: negative for malaise, night sweats HEENT: No changes in hearing or vision, no nose bleeds or other nasal problems. NECK: Negative for lumps, goiter, pain and significant neck swelling RESPIRATORY: Negative for cough, wheezing CARDIOVASCULAR: Negative for chest pain, leg swelling, palpitations, orthopnea GI: SEE HPI MUSCULOSKELETAL: Negative for joint pain or swelling, back pain, and muscle pain. SKIN: Negative for lesions, rash PSYCH: Negative for sleep disturbance, mood disorder and recent psychosocial stressors. HEMATOLOGY Negative for prolonged bleeding, bruising easily, and swollen nodes. ENDOCRINE: Negative for cold or heat intolerance, polyuria, polydipsia and goiter. NEURO: negative for tremor, gait imbalance, syncope and seizures. The remainder of the review of systems is noncontributory.   Physical Exam: BP 114/70 (BP Location: Right Arm, Patient Position:  Sitting, Cuff Size: Normal)   Pulse 75   Temp (!) 97.3 F (36.3 C) (Temporal)   Ht 5' 1.5" (1.562 m)   Wt 149 lb 6.4 oz (67.8 kg)   LMP 07/29/2021   BMI 27.77 kg/m  GENERAL: The patient is AO x3, in no acute distress. HEENT: Head is normocephalic and atraumatic. EOMI are intact. Mouth is well hydrated and without  lesions. NECK: Supple. No masses LUNGS: Clear to auscultation. No presence of rhonchi/wheezing/rales. Adequate chest expansion HEART: RRR, normal s1 and s2. ABDOMEN: Soft, nontender, no guarding, no peritoneal signs, and nondistended. BS +. No masses. EXTREMITIES: Without any cyanosis, clubbing, rash, lesions or edema. NEUROLOGIC: AOx3, no focal motor deficit. SKIN: no jaundice, no rashes  Imaging/Labs: as above  I personally reviewed and interpreted the available labs, imaging and endoscopic files.  Impression and Plan: Alicia Owens is a 48 y.o. female with a history of anxiety, depression, gastroparesis, GERD, IBS, hypothyroidism, hypertension, iron-deficiency anemia, who presents for follow up of diarrhea, abdominal pain, nausea and vomiting.  The patient has presented recurrent gastrointestinal complaints for which she underwent extensive investigations including endoscopic evaluation, cross-sectional abdominal imaging, multiple serologies and capsule endoscopy which have all been normal.  She has presented progressive improvement of her symptoms with medical treatment for possible IBS-D, which is her current working diagnosis.  I emphasized the patient importance of implementing a low FODMAP diet to potentially decrease her symptoms, while continuing Imodium  and Bentyl  as needed to decrease the symptom frequency.  At this point, we discussed treating her disease with a 2-week course of Xifaxan  for IBS-D, which she is open to try.  Will also check for parasites in stool.  -Explained presumed etiology of IBS symptoms. Patient was counseled about the benefit of implementing a  low FODMAP to improve symptoms and recurrent episodes. A dietary list was provided to the patient. Also, the patient was counseled about the benefit of avoiding stressing situations and potential environmental triggers leading to symptomatology. -Continue Imodium  as needed for diarrhea episodes -Continue Bentyl  as needed for abdominal pain -Take Xifaxan  3 times a day for 2 weeks - Check ova and parasites and stool  All questions were answered.      Alicia Cress, MD Gastroenterology and Hepatology [facility] Gastroenterology

## 2023-12-27 NOTE — Patient Instructions (Signed)
 Explained presumed etiology of IBS symptoms. Patient was counseled about the benefit of implementing a low FODMAP to improve symptoms and recurrent episodes. A dietary list was provided to the patient. Also, the patient was counseled about the benefit of avoiding stressing situations and potential environmental triggers leading to symptomatology. Continue Imodium  as needed for diarrhea episodes Continue Bentyl  as needed for abdominal pain Take Xifaxan  3 times a day for 2 weeks Perform stool test

## 2023-12-28 ENCOUNTER — Encounter: Payer: Self-pay | Admitting: Nurse Practitioner

## 2023-12-28 ENCOUNTER — Ambulatory Visit: Admitting: Nurse Practitioner

## 2023-12-28 VITALS — BP 128/82 | HR 63 | Temp 97.7°F | Ht 61.5 in | Wt 150.0 lb

## 2023-12-28 DIAGNOSIS — F341 Dysthymic disorder: Secondary | ICD-10-CM

## 2023-12-28 DIAGNOSIS — I10 Essential (primary) hypertension: Secondary | ICD-10-CM

## 2023-12-28 MED ORDER — VALSARTAN 40 MG PO TABS: 40.0000 mg | ORAL_TABLET | Freq: Every day | ORAL | 1 refills | Status: DC

## 2023-12-28 MED ORDER — FLUOXETINE HCL 40 MG PO CAPS: 40.0000 mg | ORAL_CAPSULE | Freq: Every day | ORAL | 0 refills | Status: DC

## 2023-12-28 NOTE — Progress Notes (Signed)
 Subjective:    Patient ID: Alicia Owens, female    DOB: 1976-05-19, 48 y.o.   MRN: [id number]  HPI Presents for routine follow-up on her anxiety and depression.  Continues to see improvement on 30 mg of fluoxetine .  Would like to go up 1 more step on dosing.  Denies any significant adverse effects.  Has cut back her clonazepam  to 1 at bedtime.  Tries to avoid during the day due to increased drowsiness.  Has been getting better sleep lately. BP stable.  Currently on valsartan  80 mg half tab per day.  Would like a refill for the 40 mg tablet.    Review of Systems  Constitutional:  Positive for fatigue.  Respiratory:  Negative for cough, chest tightness, shortness of breath and wheezing.   Cardiovascular:  Negative for chest pain.      12/28/2023   10:51 AM  Depression screen PHQ 2/9  Decreased Interest 1  Down, Depressed, Hopeless 0  PHQ - 2 Score 1  Altered sleeping 1  Tired, decreased energy 2  Change in appetite 1  Feeling bad or failure about yourself  1  Trouble concentrating 1  Moving slowly or fidgety/restless 0  Suicidal thoughts 0  PHQ-9 Score 7  Difficult doing work/chores Somewhat difficult      12/28/2023   10:51 AM 11/30/2023    9:09 AM 11/16/2023    1:20 PM 11/02/2023   11:25 AM  GAD 7 : Generalized Anxiety Score  Nervous, Anxious, on Edge 1 1 1 2   Control/stop worrying 1 1 1 2   Worry too much - different things 1 1 2 2   Trouble relaxing 1 1 1 1   Restless 0 0 1 1  Easily annoyed or irritable 1 1 1 1   Afraid - awful might happen 0 1 1 1   Total GAD 7 Score 5 6 8 10   Anxiety Difficulty Somewhat difficult Somewhat difficult Somewhat difficult Very difficult    Social History   Tobacco Use   Smoking status: Never   Smokeless tobacco: Never  Vaping Use   Vaping status: Never Used  Substance Use Topics   Alcohol use: No   Drug use: No        Objective:   Physical Exam NAD.  Alert, oriented.  Calm cheerful affect.  Continues to improve with each  visit.  Making good eye contact.  Speech clear.  Dressed appropriately for the weather.  Thoughts logical coherent and relevant.  Normal judgment and behavior.  Lungs clear.  Heart regular rate rhythm.  Today's Vitals   12/28/23 1042  BP: 128/82  Pulse: 63  Temp: 97.7 F (36.5 C)  SpO2: 97%  Weight: 150 lb (68 kg)  Height: 5' 1.5" (1.562 m)   Body mass index is 27.88 kg/m.       Assessment & Plan:   Problem List Items Addressed This Visit       Cardiovascular and Mediastinum   Hypertension   Relevant Medications   valsartan  (DIOVAN ) 40 MG tablet     Other   ANXIETY DEPRESSION - Primary   Relevant Medications   FLUoxetine  (PROZAC ) 40 MG capsule   Meds ordered this encounter  Medications   FLUoxetine  (PROZAC ) 40 MG capsule    Sig: Take 1 capsule (40 mg total) by mouth daily.    Dispense:  90 capsule    Refill:  0    Supervising Provider:   Charlotta Cook A [[id number]]   valsartan  (DIOVAN ) 40 MG tablet  Sig: Take 1 tablet (40 mg total) by mouth daily.    Dispense:  90 tablet    Refill:  1    Supervising Provider:   Charlotta Cook A [[id number]]   Increase fluoxetine  to 40 mg daily.  Patient to use up the current doses that she has.  Contact office if any problems with new dose. Continue valsartan  40 mg daily for blood pressure. Patient will be able to get counseling through [employer] through her insurance.  Encouraged her to begin this. Return in about 3 months (around 03/28/2024).

## 2024-01-04 ENCOUNTER — Other Ambulatory Visit (INDEPENDENT_AMBULATORY_CARE_PROVIDER_SITE_OTHER): Payer: Self-pay | Admitting: Gastroenterology

## 2024-01-04 LAB — OVA AND PARASITE EXAMINATION
CONCENTRATE RESULT:: NONE SEEN
MICRO NUMBER:: 16389946
SPECIMEN QUALITY:: ADEQUATE
TRICHROME RESULT:: NONE SEEN

## 2024-01-07 ENCOUNTER — Encounter (INDEPENDENT_AMBULATORY_CARE_PROVIDER_SITE_OTHER): Payer: Self-pay

## 2024-01-07 NOTE — Telephone Encounter (Signed)
 [facility],  Per Dr. Sammi Crick, tell the patient the stool testing was negative for parasites   Thanks,

## 2024-01-10 NOTE — Progress Notes (Signed)
 Left patient a message on voicemail to check her mychart for the message we left on 01/07/24

## 2024-02-04 ENCOUNTER — Other Ambulatory Visit (INDEPENDENT_AMBULATORY_CARE_PROVIDER_SITE_OTHER): Payer: Self-pay | Admitting: Gastroenterology

## 2024-02-12 ENCOUNTER — Encounter (INDEPENDENT_AMBULATORY_CARE_PROVIDER_SITE_OTHER): Payer: Self-pay | Admitting: Gastroenterology

## 2024-02-12 ENCOUNTER — Other Ambulatory Visit: Payer: Self-pay | Admitting: Nurse Practitioner

## 2024-02-12 ENCOUNTER — Encounter: Payer: Self-pay | Admitting: Nurse Practitioner

## 2024-02-12 DIAGNOSIS — E876 Hypokalemia: Secondary | ICD-10-CM

## 2024-02-12 DIAGNOSIS — E8809 Other disorders of plasma-protein metabolism, not elsewhere classified: Secondary | ICD-10-CM

## 2024-02-12 DIAGNOSIS — R748 Abnormal levels of other serum enzymes: Secondary | ICD-10-CM

## 2024-02-12 DIAGNOSIS — I10 Essential (primary) hypertension: Secondary | ICD-10-CM

## 2024-02-14 ENCOUNTER — Other Ambulatory Visit: Payer: Self-pay | Admitting: Family Medicine

## 2024-02-19 ENCOUNTER — Ambulatory Visit: Payer: 59 | Admitting: Family Medicine

## 2024-03-03 ENCOUNTER — Other Ambulatory Visit: Payer: Self-pay | Admitting: Family Medicine

## 2024-03-13 ENCOUNTER — Ambulatory Visit (INDEPENDENT_AMBULATORY_CARE_PROVIDER_SITE_OTHER): Admitting: Gastroenterology

## 2024-03-13 ENCOUNTER — Encounter (INDEPENDENT_AMBULATORY_CARE_PROVIDER_SITE_OTHER): Payer: Self-pay | Admitting: Gastroenterology

## 2024-03-13 VITALS — BP 125/84 | HR 81 | Temp 97.1°F | Ht 61.5 in | Wt 152.1 lb

## 2024-03-13 DIAGNOSIS — R1011 Right upper quadrant pain: Secondary | ICD-10-CM

## 2024-03-13 DIAGNOSIS — K580 Irritable bowel syndrome with diarrhea: Secondary | ICD-10-CM | POA: Diagnosis not present

## 2024-03-13 NOTE — Progress Notes (Signed)
 Toribio Fortune, M.D. Gastroenterology & Hepatology [facility] [address] [place], [place] [address]  Primary Care Physician: Alphonsa Glendia LABOR, MD [address] [place] [address]  I will communicate my assessment and recommendations to the referring MD via EMR.  Problems: Recurrent abdominal pain, diarrhea and nausea with vomiting, likely functional in nature History of gastroparesis IBS-D   History of Present Illness: Alicia Owens is a 48 y.o. female with a history of anxiety, depression, gastroparesis, GERD, IBS, hypothyroidism, hypertension, iron-deficiency anemia, who presents for follow up of diarrhea, abdominal pain, nausea and vomiting.  The patient was last seen on 12/27/2023. At that time, the patient was advised to continue low FODMAP diet.  Was advised to continue Imodium  and Bentyl  as needed and to take Xifaxan  course for 2 weeks.  Ova and parasites were checked in stool which were negative.  Patient reports that she has improved some with her Xifaxan . States that after doing low FODMAP for 5 weeks she did not notice any improvement with this diet.   She is still having 6-7 bowel movements per day. No melena or hematochezia. States she is having mostly soft stools, but she may have some episodes of watery bowel movements.  May take Imodium , which helps but makes her very bloated.  She is taking dicyclomine  4 times a week. Feels this slows her bowels but also helps with bloating.  May have some pain episodes in her RUQ and in the hypogastric area. May have these episodes 3 times a week.  Occasionally may have nausea, for which she may take Zofran  which helps.  The patient denies having any vomiting, fever, chills, hematochezia, melena, hematemesis,  jaundice, pruritus or weight loss.  Previous workup 10/2023 Cdiff and GIP negative. Fasting am cortisol normal. TSH and T4 normal. TTG IgA negative.  C1 esterase  inhibitor was normal 25, C4 was normal 18.  Porphyrins and urine were ordered and these were negative.  Fecal calprotectin came elevated at 521.   Last EGD:10/16/2023  normal esophagus, stomach and duodenum.    Last Colonoscopy:10/16/2023  normal terminal ileum, normal colon (normal random biopsies). Recommended repeat colonoscopy in 10 years.    Capsule endoscopy 12/12/2023: Normal capsule endoscopy.  Past Medical History: Past Medical History:  Diagnosis Date   Anemia    Anxiety    Chronic abdominal pain    Depression    Gastroparesis    GERD (gastroesophageal reflux disease)    Headache(784.0)    History of abdominal hysterectomy 09/03/2022   Ovaries removed as well.  04/10/2022-Dr. Aleene Boos [place]    Hypertension    Hyperthyroidism 2011   IBS (irritable bowel syndrome)    Iron deficiency anemia, unspecified 10/05/2019   More than likely this is due to heavy cycles.  Patient has ongoing history of heavy cycles.  IFBOT - January 2021   Menorrhagia 10/05/2019   PONV (postoperative nausea and vomiting)    PP care - C/S 12/17 08/22/2011    Past Surgical History: Past Surgical History:  Procedure Laterality Date   APPENDECTOMY     BASAL CELL CARCINOMA EXCISION  02/2006   face   BIOPSY  10/16/2023   Procedure: BIOPSY;  Surgeon: Fortune Angelia Toribio, MD;  Location: [facility];  Service: Gastroenterology;;   BREAST SURGERY     Breast reduction   CESAREAN SECTION  2007 and 2012   x2   CHOLECYSTECTOMY  12/15/2011   Procedure: LAPAROSCOPIC CHOLECYSTECTOMY;  Surgeon: Oneil  DELENA Budge, MD;  Location: [facility];  Service: General;  Laterality: N/A;   COLONOSCOPY     COLONOSCOPY WITH PROPOFOL  N/A 10/16/2023   Procedure: COLONOSCOPY WITH PROPOFOL ;  Surgeon: Eartha Angelia Sieving, MD;  Location: [facility];  Service: Gastroenterology;  Laterality: N/A;   ESOPHAGOGASTRODUODENOSCOPY     ESOPHAGOGASTRODUODENOSCOPY (EGD) WITH PROPOFOL  N/A 10/16/2023   Procedure:  ESOPHAGOGASTRODUODENOSCOPY (EGD) WITH PROPOFOL ;  Surgeon: Eartha Angelia Sieving, MD;  Location: [facility];  Service: Gastroenterology;  Laterality: N/A;  abdominal pain, nausea, vomiting, diarrhea   GIVENS CAPSULE STUDY N/A 12/12/2023   Procedure: IMAGING PROCEDURE, GI TRACT, INTRALUMINAL, VIA CAPSULE;  Surgeon: Eartha Angelia, Sieving, MD;  Location: [facility];  Service: Gastroenterology;  Laterality: N/A;  8:30AM;GIVENS   TUBAL LIGATION     with last c-section   WISDOM TOOTH EXTRACTION  08/2003    Family History: Family History  Problem Relation Age of Onset   Healthy Daughter    Healthy Son    Hypertension Mother    Hypertension Father    Hyperlipidemia Father    Hyperlipidemia Other    Hypertension Other    Anesthesia problems Neg Hx    Hypotension Neg Hx    Malignant hyperthermia Neg Hx    Pseudochol deficiency Neg Hx     Social History: Social History   Tobacco Use  Smoking Status Never  Smokeless Tobacco Never   Social History   Substance and Sexual Activity  Alcohol Use No   Social History   Substance and Sexual Activity  Drug Use No    Allergies: Allergies  Allergen Reactions   Ciprofloxacin      Severe nausea and vomiting when combined with flagyl  but not by itself   Codeine     REACTION: GI Upset, Vomiting   Flagyl  Coriander.[place]ookey ]     Severe nausea and vomiting   Promethazine Hcl     REACTION: Throat felt strange   Reglan  [Metoclopramide ]     twitching   Sulfonamide Derivatives     REACTION: Rash    Medications: Current Outpatient Medications  Medication Sig Dispense Refill   acetaminophen  (TYLENOL ) 500 MG tablet Take 500-1,000 mg by mouth every 6 (six) hours as needed for moderate pain (pain score 4-6).     albuterol  (VENTOLIN  HFA) 108 (90 Base) MCG/ACT inhaler INHALE TWO PUFFS INTO THE LUNGS EVERY SIX HOURS AS NEEDED FOR WHEEZING OR SHORTNESS OF BREATH 8.5 g 0   budesonide  (PULMICORT  FLEXHALER) 180 MCG/ACT inhaler Inhale 1 puff  into the lungs in the morning and at bedtime. 1 each 5   busPIRone  (BUSPAR ) 15 MG tablet TAKE ONE TABLET BY MOUTH TWICE A DAY 180 tablet 1   cetirizine (ZYRTEC) 10 MG chewable tablet Chew 10 mg by mouth daily.     CRANBERRY PO Take 1 tablet by mouth daily.     cyclobenzaprine  (FLEXERIL ) 10 MG tablet TAKE ONE (1) TABLET BY MOUTH 3 TIMES DAILY AS NEEDED (BETWEEN MEALS AND AT BEDTIME) 30 tablet 5   dicyclomine  (BENTYL ) 20 MG tablet TAKE ONE TABLET BY MOUTH THREE TIMES DAILY AS NEEDED OR AS DIRECTED 90 tablet 3   estradiol  (VIVELLE -DOT) 0.1 MG/24HR patch Place 1 patch (0.1 mg total) onto the skin 2 (two) times a week. 8 patch 2   FIBER PO Take 1 Dose by mouth daily.     FLUoxetine  (PROZAC ) 40 MG capsule Take 1 capsule (40 mg total) by mouth daily. 90 capsule 0   gabapentin  (NEURONTIN ) 100 MG capsule TAKE TWO  CAPSULES BY MOUTH AT BEDTIME 60 capsule 5   GI Cocktail (alum & mag hydroxide-simethicone /lidocaine )oral mixture Take 30 mLs by mouth 2 (two) times daily as needed. For abdominal pain 180 mL 0   Ginger, Zingiber officinalis, (GINGER PO) Take by mouth. 2 daily (Patient taking differently: Take by mouth. As needed.)     Lactobacillus-Inulin (CULTURELLE DIGESTIVE HEALTH) CAPS Take 1 capsule by mouth daily.     montelukast  (SINGULAIR ) 10 MG tablet TAKE ONE (1) TABLET BY MOUTH EVERY DAY 90 tablet 1   Multiple Vitamins-Minerals (MULTIVITAMIN WITH MINERALS) tablet Take 1 tablet by mouth daily.     ondansetron  (ZOFRAN ) 8 MG tablet TAKE ONE TABLET BY MOUTH THREE TO FOUR TIMES A DAY AS NEEDED 30 tablet 0   pantoprazole  (PROTONIX ) 40 MG tablet TAKE 1 TABLET BY MOUTH TWICE A DAY 60 tablet 2   sucralfate  (CARAFATE ) 1 g tablet TAKE ONE TABLET (1 GRAM TOTAL) BY MOUTH FOUR TIMES DIALY WITH MEALS AND AT BEDTIME. DISSOLVE TABLET IN 1/2 OF WATER, MIX AND DRINK SLURRY. (Patient taking differently: As needed.) 56 tablet 1   SUMAtriptan  (IMITREX ) 100 MG tablet Take 1 tablet (100 mg total) by mouth every 2 (two) hours as  needed for headache or migraine.     SUPER B COMPLEX/C PO Take 1 tablet by mouth daily.      topiramate  (TOPAMAX ) 50 MG tablet TAKE 3 TABLETS BY MOUTH IN THE EVENING 270 tablet 1   Triamcinolone Acetonide (NASACORT AQ NA) Place 1 spray into the nose 2 (two) times daily as needed (Rhinitis).     TURMERIC PO Take 1 capsule by mouth daily.      Ubrogepant  (UBRELVY ) 50 MG TABS Take one tab po at onset of migraine; may repeat dose x 1 after 2 hours if needed 30 tablet 2   valsartan  (DIOVAN ) 40 MG tablet Take 1 tablet (40 mg total) by mouth daily. 90 tablet 1   VITAMIN D , CHOLECALCIFEROL, PO Take 1 tablet by mouth daily.     clonazePAM  (KLONOPIN ) 0.5 MG tablet Take 1/2 tab po in the morning, 1/2 tab po in the afternoon and one po at bedtime (Patient not taking: Reported on 03/13/2024) 60 tablet 0   No current facility-administered medications for this visit.    Review of Systems: GENERAL: negative for malaise, night sweats HEENT: No changes in hearing or vision, no nose bleeds or other nasal problems. NECK: Negative for lumps, goiter, pain and significant neck swelling RESPIRATORY: Negative for cough, wheezing CARDIOVASCULAR: Negative for chest pain, leg swelling, palpitations, orthopnea GI: SEE HPI MUSCULOSKELETAL: Negative for joint pain or swelling, back pain, and muscle pain. SKIN: Negative for lesions, rash PSYCH: Negative for sleep disturbance, mood disorder and recent psychosocial stressors. HEMATOLOGY Negative for prolonged bleeding, bruising easily, and swollen nodes. ENDOCRINE: Negative for cold or heat intolerance, polyuria, polydipsia and goiter. NEURO: negative for tremor, gait imbalance, syncope and seizures. The remainder of the review of systems is noncontributory.   Physical Exam: BP 125/84 (BP Location: Left Arm, Patient Position: Sitting, Cuff Size: Large)   Pulse 81   Temp (!) 97.1 F (36.2 C) (Temporal)   Ht 5' 1.5 (1.562 m)   Wt 152 lb 1.6 oz (69 kg)   LMP  07/29/2021   BMI 28.27 kg/m  GENERAL: The patient is AO x3, in no acute distress. HEENT: Head is normocephalic and atraumatic. EOMI are intact. Mouth is well hydrated and without lesions. NECK: Supple. No masses LUNGS: Clear to auscultation. No presence of  rhonchi/wheezing/rales. Adequate chest expansion HEART: RRR, normal s1 and s2. ABDOMEN: Mildly tender upon palpation of the right upper quadrant, no guarding, no peritoneal signs, and nondistended. BS +. No masses. EXTREMITIES: Without any cyanosis, clubbing, rash, lesions or edema. NEUROLOGIC: AOx3, no focal motor deficit. SKIN: no jaundice, no rashes  Imaging/Labs: as above  I personally reviewed and interpreted the available labs, imaging and endoscopic files.  Impression and Plan: Alicia Owens is a 47 y.o. female with a history of anxiety, depression, gastroparesis, GERD, IBS, hypothyroidism, hypertension, iron-deficiency anemia, who presents for follow up of diarrhea, abdominal pain, nausea and vomiting. Patient has had multiple investigations in the past as listed in this note, with no significant abnormalities. Given chronicity of symptoms and negative workup, it is likely her symptoms are functional in nature. She presented improvement of her symptoms after course of Xifaxan  but still presenting some complaints, especially diarrhea. Patient is not a candidate for Viberzi as she is status postcholecystectomy. As Bentyl  has provided some relief, I encouraged her to take this standing twice a day - she can take Imodium  and IbGard as needed for symptom relief. As Zofran  has also helped, she can continue this for nausea as needed.  -Increase dicyclomine  to 20 mg every 12 hours standing -Can take Imodium  as needed if having diarrhea -significant amount of stool more than 4 times per day despite taking dicyclomine  -Can take IBgard or peppermint tea/edible peppermint as needed for bloating -Continue with Zofran  as needed for nausea  All  questions were answered.      Toribio Fortune, MD Gastroenterology and Hepatology [facility]

## 2024-03-13 NOTE — Patient Instructions (Addendum)
 Increase dicyclomine  to 20 mg every 12 hours standing Can take Imodium  as needed if having diarrhea -significant amount of stool more than 4 times per day despite taking dicyclomine  Can take IBgard or peppermint tea/edible peppermint as needed for bloating Continue with Zofran  as needed for nausea

## 2024-03-25 ENCOUNTER — Encounter: Payer: Self-pay | Admitting: Nurse Practitioner

## 2024-03-25 ENCOUNTER — Ambulatory Visit: Admitting: Nurse Practitioner

## 2024-03-25 VITALS — BP 114/73 | HR 73 | Temp 98.1°F | Ht 61.5 in | Wt 149.0 lb

## 2024-03-25 DIAGNOSIS — G43709 Chronic migraine without aura, not intractable, without status migrainosus: Secondary | ICD-10-CM | POA: Diagnosis not present

## 2024-03-25 DIAGNOSIS — R5383 Other fatigue: Secondary | ICD-10-CM | POA: Diagnosis not present

## 2024-03-25 DIAGNOSIS — R748 Abnormal levels of other serum enzymes: Secondary | ICD-10-CM

## 2024-03-25 DIAGNOSIS — J4540 Moderate persistent asthma, uncomplicated: Secondary | ICD-10-CM

## 2024-03-25 DIAGNOSIS — I10 Essential (primary) hypertension: Secondary | ICD-10-CM | POA: Diagnosis not present

## 2024-03-25 DIAGNOSIS — F341 Dysthymic disorder: Secondary | ICD-10-CM

## 2024-03-25 DIAGNOSIS — E8809 Other disorders of plasma-protein metabolism, not elsewhere classified: Secondary | ICD-10-CM | POA: Diagnosis not present

## 2024-03-25 DIAGNOSIS — E876 Hypokalemia: Secondary | ICD-10-CM

## 2024-03-25 MED ORDER — ALPRAZOLAM 0.25 MG PO TABS: 0.2500 mg | ORAL_TABLET | Freq: Two times a day (BID) | ORAL | 0 refills | Status: DC | PRN

## 2024-03-25 NOTE — Progress Notes (Signed)
 Subjective:    Patient ID: Alicia Owens, female    DOB: 1976-07-17, 48 y.o.   MRN: [id number]  HPI Presents for routine follow-up.  Patient was unable to continue clonazepam  due to drowsiness and feeling like a zombie.  This was even after several hours taking it at bedtime.  Has switched back to her low-dose Xanax  which works much better.  Takes this on a rare basis as needed.  Has only taken 2-3 tabs since April.  Has had a flareup of her migraines recently with the heat and humidity.  Having about 2/week, usually responds to the Ubrelvy , rarely takes sumatriptan  hand.  No change in her migraine symptomatology.  Takes daily topiramate  for prevention.  Continues to use Pulmicort  once a day which is controlling her asthma.  Gets regular gynecological physicals.  Also regular follow-up with GI.  Overall much improved emotionally but continues to have fatigue.   Review of Systems  Constitutional:  Positive for fatigue.  HENT:  Negative for sore throat and trouble swallowing.   Respiratory:  Negative for cough, chest tightness, shortness of breath and wheezing.   Cardiovascular:  Negative for chest pain.  Psychiatric/Behavioral:  Negative for self-injury and suicidal ideas.        03/25/2024    8:29 AM  Depression screen PHQ 2/9  Decreased Interest 0  Down, Depressed, Hopeless 0  PHQ - 2 Score 0  Altered sleeping 1  Tired, decreased energy 1  Change in appetite 1  Feeling bad or failure about yourself  0  Trouble concentrating 0  Moving slowly or fidgety/restless 0  Suicidal thoughts 0  PHQ-9 Score 3  Difficult doing work/chores Not difficult at all      03/25/2024    8:30 AM 12/28/2023   10:51 AM 11/30/2023    9:09 AM 11/16/2023    1:20 PM  GAD 7 : Generalized Anxiety Score  Nervous, Anxious, on Edge 0 1 1 1   Control/stop worrying 0 1 1 1   Worry too much - different things 0 1 1 2   Trouble relaxing 0 1 1 1   Restless 0 0 0 1  Easily annoyed or irritable 1 1 1 1   Afraid -  awful might happen 0 0 1 1  Total GAD 7 Score 1 5 6 8   Anxiety Difficulty Not difficult at all Somewhat difficult Somewhat difficult Somewhat difficult   Social History   Tobacco Use   Smoking status: Never   Smokeless tobacco: Never  Vaping Use   Vaping status: Never Used  Substance Use Topics   Alcohol use: No   Drug use: No         Objective:   Physical Exam Vitals and nursing note reviewed.  Constitutional:      General: She is not in acute distress. Neck:     Comments: Thyroid  nontender to palpation, no mass or goiter noted. Cardiovascular:     Rate and Rhythm: Normal rate and regular rhythm.     Heart sounds: Normal heart sounds.  Pulmonary:     Effort: Pulmonary effort is normal.     Breath sounds: Normal breath sounds.  Musculoskeletal:     Cervical back: Normal range of motion and neck supple.  Lymphadenopathy:     Cervical: No cervical adenopathy.  Neurological:     Mental Status: She is alert and oriented to person, place, and time.  Psychiatric:        Mood and Affect: Mood normal.  Behavior: Behavior normal.        Thought Content: Thought content normal.        Judgment: Judgment normal.     Comments: Calm cheerful affect.  Making good eye contact.  Speech clear.    Today's Vitals   03/25/24 0823  BP: 114/73  Pulse: 73  Temp: 98.1 F (36.7 C)  SpO2: 100%  Weight: 149 lb (67.6 kg)  Height: 5' 1.5 (1.562 m)   Body mass index is 27.7 kg/m.        Assessment & Plan:   Problem List Items Addressed This Visit       Cardiovascular and Mediastinum   Hypertension - Primary   Relevant Orders   Comprehensive metabolic panel with GFR   Lipid panel   Migraines     Respiratory   Moderate persistent asthma     Other   ANXIETY DEPRESSION   Relevant Medications   ALPRAZolam  (XANAX ) 0.25 MG tablet   Elevated liver enzymes   Relevant Orders   Comprehensive metabolic panel with GFR   Hypoalbuminemia   Relevant Orders    Comprehensive metabolic panel with GFR   Hypokalemia   Relevant Orders   Comprehensive metabolic panel with GFR   Other Visit Diagnoses       Fatigue, unspecified type       Relevant Orders   CBC with Differential/Platelet   TSH      Meds ordered this encounter  Medications   ALPRAZolam  (XANAX ) 0.25 MG tablet    Sig: Take 1 tablet (0.25 mg total) by mouth 2 (two) times daily as needed for anxiety.    Dispense:  30 tablet    Refill:  0    Supervising Provider:   ALPHONSA HAMILTON A [[id number]]   Continue low-dose Xanax  as directed.  Hold on any further clonazepam . Continue current medication regimen for migraines.  Contact office if increased frequency or new symptomatology. Labs ordered.  Return in about 6 months (around 09/25/2024).

## 2024-03-26 ENCOUNTER — Ambulatory Visit: Payer: Self-pay | Admitting: Nurse Practitioner

## 2024-03-26 LAB — CBC WITH DIFFERENTIAL/PLATELET
Basophils Absolute: 0 x10E3/uL (ref 0.0–0.2)
Basos: 1 %
EOS (ABSOLUTE): 0.4 x10E3/uL (ref 0.0–0.4)
Eos: 5 %
Hematocrit: 42.2 % (ref 34.0–46.6)
Hemoglobin: 13.9 g/dL (ref 11.1–15.9)
Immature Grans (Abs): 0 x10E3/uL (ref 0.0–0.1)
Immature Granulocytes: 0 %
Lymphocytes Absolute: 2.3 x10E3/uL (ref 0.7–3.1)
Lymphs: 28 %
MCH: 32.6 pg (ref 26.6–33.0)
MCHC: 32.9 g/dL (ref 31.5–35.7)
MCV: 99 fL — ABNORMAL HIGH (ref 79–97)
Monocytes Absolute: 0.4 x10E3/uL (ref 0.1–0.9)
Monocytes: 5 %
Neutrophils Absolute: 5.1 x10E3/uL (ref 1.4–7.0)
Neutrophils: 61 %
Platelets: 334 x10E3/uL (ref 150–450)
RBC: 4.26 x10E6/uL (ref 3.77–5.28)
RDW: 12.8 % (ref 11.7–15.4)
WBC: 8.2 x10E3/uL (ref 3.4–10.8)

## 2024-03-26 LAB — LIPID PANEL
Chol/HDL Ratio: 4 ratio (ref 0.0–4.4)
Cholesterol, Total: 268 mg/dL — ABNORMAL HIGH (ref 100–199)
HDL: 67 mg/dL (ref >39)
LDL Chol Calc (NIH): 179 mg/dL — ABNORMAL HIGH (ref 0–99)
Triglycerides: 122 mg/dL (ref 0–149)
VLDL Cholesterol Cal: 22 mg/dL (ref 5–40)

## 2024-03-26 LAB — COMPREHENSIVE METABOLIC PANEL WITH GFR
ALT: 14 IU/L (ref 0–32)
AST: 17 IU/L (ref 0–40)
Albumin: 4.6 g/dL (ref 3.9–4.9)
Alkaline Phosphatase: 93 IU/L (ref 44–121)
BUN/Creatinine Ratio: 11 (ref 9–23)
BUN: 9 mg/dL (ref 6–24)
Bilirubin Total: 0.3 mg/dL (ref 0.0–1.2)
CO2: 19 mmol/L — ABNORMAL LOW (ref 20–29)
Calcium: 9.4 mg/dL (ref 8.7–10.2)
Chloride: 103 mmol/L (ref 96–106)
Creatinine, Ser: 0.81 mg/dL (ref 0.57–1.00)
Globulin, Total: 2.8 g/dL (ref 1.5–4.5)
Glucose: 85 mg/dL (ref 70–99)
Potassium: 4.8 mmol/L (ref 3.5–5.2)
Sodium: 139 mmol/L (ref 134–144)
Total Protein: 7.4 g/dL (ref 6.0–8.5)
eGFR: 90 mL/min/1.73 (ref >59)

## 2024-03-26 LAB — TSH: TSH: 0.742 u[IU]/mL (ref 0.450–4.500)

## 2024-04-10 ENCOUNTER — Other Ambulatory Visit: Payer: Self-pay | Admitting: Nurse Practitioner

## 2024-04-24 ENCOUNTER — Other Ambulatory Visit: Payer: Self-pay | Admitting: Family Medicine

## 2024-05-12 ENCOUNTER — Encounter: Payer: Self-pay | Admitting: Family Medicine

## 2024-05-12 ENCOUNTER — Ambulatory Visit: Admitting: Family Medicine

## 2024-05-12 VITALS — BP 122/80 | HR 74 | Temp 97.3°F | Ht 61.5 in | Wt 153.0 lb

## 2024-05-12 DIAGNOSIS — R5383 Other fatigue: Secondary | ICD-10-CM

## 2024-05-12 DIAGNOSIS — M791 Myalgia, unspecified site: Secondary | ICD-10-CM

## 2024-05-12 DIAGNOSIS — D7589 Other specified diseases of blood and blood-forming organs: Secondary | ICD-10-CM | POA: Diagnosis not present

## 2024-05-12 DIAGNOSIS — M792 Neuralgia and neuritis, unspecified: Secondary | ICD-10-CM

## 2024-05-12 DIAGNOSIS — R0683 Snoring: Secondary | ICD-10-CM

## 2024-05-12 DIAGNOSIS — E559 Vitamin D deficiency, unspecified: Secondary | ICD-10-CM | POA: Diagnosis not present

## 2024-05-12 NOTE — Progress Notes (Signed)
 Subjective:    Patient ID: Alicia Owens, female    DOB: [birth year], 48 y.o.   MRN: [id number]  HPI fatigue  Body pain x's several weeks Brain fog  The 10-year ASCVD risk score (Arnett DK, et al., 2019) is: 2.9%   Values used to calculate the score:     Age: 76 years     Clincally relevant sex: Female     Is Non-Hispanic African American: No     Diabetic: Yes     Tobacco smoker: No     Systolic Blood Pressure: 122 mmHg     Is BP treated: Yes     HDL Cholesterol: 67 mg/dL     Total Cholesterol: 268 mg/dL  Alicia Owens is a 48 year old female who presents with fatigue, body aches, and brain fog.  She has been experiencing extreme fatigue that has progressively worsened over the past several weeks, affecting her motivation to perform daily activities such as housework and exercise. Although she feels somewhat refreshed upon waking, the fatigue persists throughout the day.  In addition to fatigue, she experiences intermittent body aches and severe brain fog. The body aches are generalized, affecting her back, hands, legs, and hips, and are described as similar to post-exercise soreness. She finds some relief with Tylenol . The brain fog is severe enough to cause her to repeat questions or statements without realizing it, which she finds embarrassing.  Her sleep pattern involves going to bed around 10:00-10:30 PM and waking up at 6:15 AM. She sometimes wakes up during the night but generally sleeps through. She snores, particularly when falling asleep in a chair, but her partner has not noticed any episodes of apnea. She sometimes feels sleepy during the day and can doze off if still for a while.  She has a history of irritable bowel syndrome, which has been variable with good and bad days. On bad days, she may have up to seven or eight bowel movements despite taking Imodium . No blood in her stool or urine, and no unusual fevers or breathing difficulties.  She experiences occasional dizzy  spells while sitting, which she associates with her current medication, valsartan . Her headaches and migraines are under control, and no significant heartburn issues.  Her husband has noticed her tiredness and achiness but has not observed any other significant changes.  Past Medical History:  Diagnosis Date   Anemia    Anxiety    Chronic abdominal pain    Depression    Gastroparesis    GERD (gastroesophageal reflux disease)    Headache(784.0)    History of abdominal hysterectomy 09/03/2022   Ovaries removed as well.  04/10/2022-Dr. Aleene Boos [place]    Hypertension    Hyperthyroidism 2011   IBS (irritable bowel syndrome)    Iron deficiency anemia, unspecified 10/05/2019   More than likely this is due to heavy cycles.  Patient has ongoing history of heavy cycles.  IFBOT - January 2021   Menorrhagia 10/05/2019   PONV (postoperative nausea and vomiting)    PP care - C/S 12/17 08/22/2011    Current Outpatient Medications on File Prior to Visit  Medication Sig Dispense Refill   acetaminophen  (TYLENOL ) 500 MG tablet Take 500-1,000 mg by mouth every 6 (six) hours as needed for moderate pain (pain score 4-6).     albuterol  (VENTOLIN  HFA) 108 (90 Base) MCG/ACT inhaler INHALE TWO PUFFS INTO THE LUNGS EVERY SIX HOURS AS NEEDED FOR WHEEZING OR SHORTNESS OF BREATH 8.5 g 0  ALPRAZolam  (XANAX ) 0.25 MG tablet Take 1 tablet (0.25 mg total) by mouth 2 (two) times daily as needed for anxiety. 30 tablet 0   budesonide  (PULMICORT  FLEXHALER) 180 MCG/ACT inhaler Inhale 1 puff into the lungs in the morning and at bedtime. 1 each 5   busPIRone  (BUSPAR ) 15 MG tablet TAKE ONE TABLET BY MOUTH TWICE A DAY 180 tablet 1   cetirizine (ZYRTEC) 10 MG chewable tablet Chew 10 mg by mouth daily.     CRANBERRY PO Take 1 tablet by mouth daily.     cyclobenzaprine  (FLEXERIL ) 10 MG tablet TAKE ONE (1) TABLET BY MOUTH 3 TIMES DAILY AS NEEDED (BETWEEN MEALS AND AT BEDTIME) 30 tablet 5   dicyclomine  (BENTYL ) 20  MG tablet TAKE ONE TABLET BY MOUTH THREE TIMES DAILY AS NEEDED OR AS DIRECTED 90 tablet 3   estradiol  (VIVELLE -DOT) 0.1 MG/24HR patch Place 1 patch (0.1 mg total) onto the skin 2 (two) times a week. 8 patch 2   FIBER PO Take 1 Dose by mouth daily.     FLUoxetine  (PROZAC ) 40 MG capsule TAKE ONE CAPSULE (40MG  TOTAL) BY MOUTH DAILY 90 capsule 1   gabapentin  (NEURONTIN ) 100 MG capsule TAKE TWO CAPSULES BY MOUTH AT BEDTIME 60 capsule 5   GI Cocktail (alum & mag hydroxide-simethicone /lidocaine )oral mixture Take 30 mLs by mouth 2 (two) times daily as needed. For abdominal pain 180 mL 0   Ginger, Zingiber officinalis, (GINGER PO) Take by mouth. 2 daily (Patient taking differently: Take by mouth. As needed.)     Lactobacillus-Inulin (CULTURELLE DIGESTIVE HEALTH) CAPS Take 1 capsule by mouth daily.     montelukast  (SINGULAIR ) 10 MG tablet TAKE ONE (1) TABLET BY MOUTH EVERY DAY 90 tablet 1   Multiple Vitamins-Minerals (MULTIVITAMIN WITH MINERALS) tablet Take 1 tablet by mouth daily.     ondansetron  (ZOFRAN ) 8 MG tablet TAKE ONE TABLET BY MOUTH THREE TO FOUR TIMES A DAY AS NEEDED 30 tablet 0   pantoprazole  (PROTONIX ) 40 MG tablet TAKE 1 TABLET BY MOUTH TWICE A DAY 60 tablet 2   Probiotic Product (ALIGN PO) Take by mouth.     sucralfate  (CARAFATE ) 1 g tablet TAKE ONE TABLET (1 GRAM TOTAL) BY MOUTH FOUR TIMES DIALY WITH MEALS AND AT BEDTIME. DISSOLVE TABLET IN 1/2 OF WATER, MIX AND DRINK SLURRY. (Patient taking differently: As needed.) 56 tablet 1   SUMAtriptan  (IMITREX ) 100 MG tablet Take 1 tablet (100 mg total) by mouth every 2 (two) hours as needed for headache or migraine.     SUPER B COMPLEX/C PO Take 1 tablet by mouth daily.      topiramate  (TOPAMAX ) 50 MG tablet TAKE 3 TABLETS BY MOUTH IN THE EVENING 270 tablet 1   Triamcinolone Acetonide (NASACORT AQ NA) Place 1 spray into the nose 2 (two) times daily as needed (Rhinitis).     TURMERIC PO Take 1 capsule by mouth daily.      Ubrogepant  (UBRELVY ) 50 MG  TABS Take one tab po at onset of migraine; may repeat dose x 1 after 2 hours if needed 30 tablet 2   valsartan  (DIOVAN ) 40 MG tablet Take 1 tablet (40 mg total) by mouth daily. 90 tablet 1   VITAMIN D , CHOLECALCIFEROL, PO Take 1 tablet by mouth daily.     No current facility-administered medications on file prior to visit.    Review of Systems     Objective:   Physical Exam General-in no acute distress Eyes-no discharge Lungs-respiratory rate normal, CTA CV-no murmurs,RRR Extremities  skin warm dry no edema Neuro grossly normal Behavior normal, alert Patient has tenderness in the left upper back mid back low back muscles as well as shoulders and near the knees and lower legs       Assessment & Plan:  Fatigue, body aches, and brain fog under evaluation Worsening fatigue, diffuse body aches, and severe brain fog. Differential includes vitamin deficiencies, inflammatory conditions, and fibromyalgia. - Order blood work to evaluate inflammatory markers and B12 levels. - Provide fibromyalgia questionnaire for completion and return.  Suspected fibromyalgia Considered due to symptoms of diffuse body aches and brain fog. Characterized by hypersensitive nerves causing unexplained achiness and cognitive issues. - Provide fibromyalgia questionnaire for completion and return.  Suspected sleep apnea Reports snoring, daytime sleepiness, and occasional night waking. Low likelihood of severe sleep apnea, but a sleep study is recommended to rule out sleep apnea as a contributing factor to symptoms. - Refer to sleep apnea specialist for a sleep study.  Irritable bowel syndrome Experiences up to three bad days a week with frequent bowel movements despite loperamide . May contribute to fatigue.  Hypertension Blood pressure is well-controlled on valsartan . Occasionally experiences transient dizzy spells while sitting, not associated with positional changes.  Migraine Migraines are under decent  control with no significant issues reported during this visit.  General Health Maintenance Cholesterol levels are increasing, with high levels of both HDL and LDL cholesterol. Risk for heart disease remains low at 2% over the next ten years. Monitoring is advised. - Check vitamin D  levels. - Reassess cholesterol levels in early spring.  1. Myalgia (Primary) Lab work ordered there is possibility there could be underlying fibromyalgia questionnaire given she will return this to us  - Vitamin B12 - VITAMIN D  25 Hydroxy (Vit-D Deficiency, Fractures) - Sedimentation rate - ANA - CK  2. Snoring Referral for sleep study She has daytime sleepiness with fatigue tiredness she also relates some days of very low energy level - Ambulatory referral to Neurology  3. Fatigue, unspecified type Lab work ordered await results - Vitamin B12 - VITAMIN D  25 Hydroxy (Vit-D Deficiency, Fractures) - Sedimentation rate - ANA - CK  4. Vitamin D  deficiency Check vitamin D  - VITAMIN D  25 Hydroxy (Vit-D Deficiency, Fractures)  5. Macrocytosis Will look at vitamin B12 - VITAMIN D  25 Hydroxy (Vit-D Deficiency, Fractures) - Sedimentation rate - ANA - CK  Follow-up within 6 months

## 2024-05-13 ENCOUNTER — Ambulatory Visit: Payer: Self-pay | Admitting: Family Medicine

## 2024-05-13 LAB — VITAMIN B12: Vitamin B-12: 561 pg/mL (ref 232–1245)

## 2024-05-13 LAB — ANA: Anti Nuclear Antibody (ANA): NEGATIVE

## 2024-05-13 LAB — VITAMIN D 25 HYDROXY (VIT D DEFICIENCY, FRACTURES): Vit D, 25-Hydroxy: 31.5 ng/mL (ref 30.0–100.0)

## 2024-05-13 LAB — SEDIMENTATION RATE: Sed Rate: 8 mm/h (ref 0–32)

## 2024-05-13 LAB — CK: Total CK: 40 U/L (ref 32–182)

## 2024-06-06 ENCOUNTER — Other Ambulatory Visit: Payer: Self-pay | Admitting: Family Medicine

## 2024-06-18 ENCOUNTER — Encounter (INDEPENDENT_AMBULATORY_CARE_PROVIDER_SITE_OTHER): Payer: Self-pay | Admitting: Gastroenterology

## 2024-07-14 ENCOUNTER — Other Ambulatory Visit (HOSPITAL_COMMUNITY): Payer: Self-pay

## 2024-07-14 ENCOUNTER — Other Ambulatory Visit: Payer: Self-pay | Admitting: Nurse Practitioner

## 2024-07-14 ENCOUNTER — Encounter: Payer: Self-pay | Admitting: Nurse Practitioner

## 2024-07-14 ENCOUNTER — Telehealth: Payer: Self-pay | Admitting: Pharmacy Technician

## 2024-07-14 NOTE — Telephone Encounter (Signed)
 Pharmacy Patient Advocate Encounter   Received notification from Onbase that prior authorization for Ubrelvy  50MG  tablets is required/requested.   Insurance verification completed.   The patient is insured through [facility].   Per test claim: PA required; PA submitted to above mentioned insurance via Latent Key/confirmation #/EOC [id number] Status is pending

## 2024-07-15 ENCOUNTER — Other Ambulatory Visit (HOSPITAL_COMMUNITY): Payer: Self-pay

## 2024-07-15 NOTE — Telephone Encounter (Signed)
 Pharmacy Patient Advocate Encounter  Received notification from [facility] that Prior Authorization for Ubrelvy  50MG  tablets has been APPROVED from 07/14/2024 to 10/06/2024. Ran test claim, Copay is $0.00. This test claim was processed through [facility]- copay amounts may vary at other pharmacies due to pharmacy/plan contracts, or as the patient moves through the different stages of their insurance plan.   PA #/Case ID/Reference #: [id number]   Max Qty of 16 tablets over 27 days.

## 2024-08-13 ENCOUNTER — Encounter (INDEPENDENT_AMBULATORY_CARE_PROVIDER_SITE_OTHER): Payer: Self-pay | Admitting: Gastroenterology

## 2024-08-13 ENCOUNTER — Telehealth (INDEPENDENT_AMBULATORY_CARE_PROVIDER_SITE_OTHER): Payer: Self-pay

## 2024-08-13 ENCOUNTER — Other Ambulatory Visit (INDEPENDENT_AMBULATORY_CARE_PROVIDER_SITE_OTHER): Payer: Self-pay

## 2024-08-13 DIAGNOSIS — K219 Gastro-esophageal reflux disease without esophagitis: Secondary | ICD-10-CM

## 2024-08-13 MED ORDER — PANTOPRAZOLE SODIUM 40 MG PO TBEC: 40.0000 mg | DELAYED_RELEASE_TABLET | Freq: Two times a day (BID) | ORAL | 2 refills | Status: AC

## 2024-08-13 NOTE — Telephone Encounter (Signed)
 I have sent the medication into the pharmacy, but wanted you to see the patient's well wishes.   Good afternoon, Dr. JAYSON. I am in need of refills on my pantoprazole . I still use [facility] on [address] I am sorry to hear you are leaving, but I wish you the best on your new adventures. I and the rest of my family will SINCERELY MISS YOU AND YOUR CARE! THANK YOU GREATLY FOR ALL YOU HAVE GIVEN! Take care, Dr. JAYSON.

## 2024-08-18 ENCOUNTER — Other Ambulatory Visit: Payer: Self-pay | Admitting: Nurse Practitioner

## 2024-08-21 DIAGNOSIS — L600 Ingrowing nail: Secondary | ICD-10-CM | POA: Diagnosis not present

## 2024-09-02 ENCOUNTER — Encounter: Payer: Self-pay | Admitting: Family Medicine

## 2024-09-02 NOTE — Telephone Encounter (Signed)
 Nurses Based on the information and what [facility] sent us -please send in doxycycline  100 mg 1 twice daily for 7 days I also recommend warm compresses 10 minutes at a time several times a day Patient to take with a small snack and a tall glass of water If the area continues to have ongoing troubles or if he gets worse-I would recommend a follow-up visit with 1 of us  thank you-Dr. Glendia

## 2024-09-03 ENCOUNTER — Other Ambulatory Visit: Payer: Self-pay

## 2024-09-03 MED ORDER — DOXYCYCLINE HYCLATE 100 MG PO TABS: 100.0000 mg | ORAL_TABLET | Freq: Two times a day (BID) | ORAL | 0 refills | Status: AC

## 2024-09-05 ENCOUNTER — Other Ambulatory Visit: Payer: Self-pay | Admitting: Family Medicine

## 2024-09-25 ENCOUNTER — Other Ambulatory Visit: Payer: Self-pay | Admitting: Nurse Practitioner

## 2024-09-26 ENCOUNTER — Other Ambulatory Visit (HOSPITAL_COMMUNITY): Payer: Self-pay

## 2024-09-26 ENCOUNTER — Telehealth: Payer: Self-pay | Admitting: Pharmacy Technician

## 2024-09-26 NOTE — Telephone Encounter (Signed)
 Pharmacy Patient Advocate Encounter   Received notification from [facility] KEY that prior authorization for Pulmicort  Flexhaler 180MCG/ACT aerosol powder is required/requested.   Insurance verification completed.   The patient is insured through [facility].   Per test claim: PA required; PA submitted to above mentioned insurance via Latent Key/confirmation #/EOC [id number] Status is pending

## 2024-09-29 NOTE — Telephone Encounter (Signed)
 Pharmacy Patient Advocate Encounter  Received notification from [facility] that Prior Authorization for Pulmicort  Flexhaler 180MCG/ACT aerosol powder has been DENIED.  Full denial letter will be uploaded to the media tab. See denial reason below.   PA #/Case ID/Reference #: [id number]

## 2024-09-30 NOTE — Telephone Encounter (Signed)
 Nurses-- Please let the patient know that her Pulmicort  is not covered by her insurance Qvar  is covered also [employer] She has an appointment with Elveria coming up at the end of the week-I would recommend trying the Qvar -thanks-Dr. Glendia

## 2024-10-02 ENCOUNTER — Ambulatory Visit: Admitting: Nurse Practitioner

## 2024-10-02 VITALS — BP 132/87 | HR 98 | Temp 98.1°F | Ht 61.5 in | Wt 170.1 lb

## 2024-10-02 DIAGNOSIS — F341 Dysthymic disorder: Secondary | ICD-10-CM | POA: Diagnosis not present

## 2024-10-02 DIAGNOSIS — G43709 Chronic migraine without aura, not intractable, without status migrainosus: Secondary | ICD-10-CM | POA: Diagnosis not present

## 2024-10-02 DIAGNOSIS — I10 Essential (primary) hypertension: Secondary | ICD-10-CM

## 2024-10-02 DIAGNOSIS — F411 Generalized anxiety disorder: Secondary | ICD-10-CM | POA: Diagnosis not present

## 2024-10-02 DIAGNOSIS — J4540 Moderate persistent asthma, uncomplicated: Secondary | ICD-10-CM

## 2024-10-02 DIAGNOSIS — E785 Hyperlipidemia, unspecified: Secondary | ICD-10-CM | POA: Diagnosis not present

## 2024-10-02 MED ORDER — BUSPIRONE HCL 15 MG PO TABS: 15.0000 mg | ORAL_TABLET | Freq: Two times a day (BID) | ORAL | 1 refills | Status: AC

## 2024-10-02 MED ORDER — BUPROPION HCL ER (XL) 150 MG PO TB24: 150.0000 mg | ORAL_TABLET | Freq: Every day | ORAL | 0 refills | Status: AC

## 2024-10-02 MED ORDER — FLUOXETINE HCL 40 MG PO CAPS: ORAL_CAPSULE | ORAL | 1 refills | Status: AC

## 2024-10-02 NOTE — Progress Notes (Signed)
 "  Subjective:    Patient ID: Alicia Owens, female    DOB: [birth year], 49 y.o.   MRN: [id number]  HPI Discussed the use of AI scribe software for clinical note transcription with the patient, who gave verbal consent to proceed.  History of Present Illness Alicia Owens is a 49 year old female who presents with concerns about sexual side effects from fluoxetine .  Her anxiety is generally well-managed with fluoxetine  40 mg daily and Buspar , although she experiences occasional exacerbations. She is experiencing significant sexual side effects, which she describes as 'really, really bad.' She previously tried Wellbutrin  (bupropion ) years ago while transitioning from Zoloft, but it resulted in excessive crying, and she is unsure if it was due to the medication or the transition process.  She reports issues with energy and weight gain, feeling exhausted after minimal activity such as cleaning the kitchen, and notes a significant change in her weight. Her thyroid  tests were normal a few months ago. She is concerned about the impact of her weight gain on her energy levels and overall health. Her sleep is generally good, and she experiences shortness of breath with excessive walking or exercise, which she attributes to weight gain.  She is currently taking Pulmicort  for respiratory issues, topiramate  for migraines, and valsartan  for blood pressure. She reports an increase in headache frequency and is using Ubrelvy  for migraine relief. She is also on pantoprazole  for reflux, managed by another provider.  Her recent lab work showed high cholesterol levels, with an LDL of 179. She has a family history of heart disease and stroke. Her cholesterol was previously lower at 137 a year ago. Other lab results, including liver enzymes, blood counts, sugar, and kidney function, were normal.   Review of Systems  Constitutional:  Positive for activity change and fatigue. Negative for appetite change.  HENT:  Negative  for sore throat and trouble swallowing.   Respiratory:  Positive for shortness of breath. Negative for cough, chest tightness and wheezing.   Cardiovascular:  Negative for chest pain and leg swelling.      10/02/2024    9:09 AM  Depression screen PHQ 2/9  Decreased Interest 0  Down, Depressed, Hopeless 0  PHQ - 2 Score 0  Altered sleeping 1  Tired, decreased energy 1  Change in appetite 0  Feeling bad or failure about yourself  0  Trouble concentrating 0  Moving slowly or fidgety/restless 0  Suicidal thoughts 0  PHQ-9 Score 2  Difficult doing work/chores Somewhat difficult      10/02/2024    9:09 AM 05/12/2024    8:50 AM 03/25/2024    8:30 AM 12/28/2023   10:51 AM  GAD 7 : Generalized Anxiety Score  Nervous, Anxious, on Edge 0 0  0  1   Control/stop worrying 0 0  0  1   Worry too much - different things 0 0  0  1   Trouble relaxing 0 0  0  1   Restless 0 0  0  0   Easily annoyed or irritable 1 0  1  1   Afraid - awful might happen 0 0  0  0   Total GAD 7 Score 1 0 1 5  Anxiety Difficulty Somewhat difficult Not difficult at all Not difficult at all Somewhat difficult     Data saved with a previous flowsheet row definition    Social History[1]      Objective:   Physical Exam Vitals and  nursing note reviewed.  Constitutional:      General: She is not in acute distress. Neck:     Comments: Thyroid  non tender, no mass or goiter noted.  Cardiovascular:     Rate and Rhythm: Normal rate and regular rhythm.     Heart sounds: Normal heart sounds.  Pulmonary:     Effort: Pulmonary effort is normal.     Breath sounds: Normal breath sounds.  Musculoskeletal:     Cervical back: Neck supple.  Lymphadenopathy:     Cervical: No cervical adenopathy.  Neurological:     Mental Status: She is alert and oriented to person, place, and time.  Psychiatric:        Attention and Perception: Attention normal.        Mood and Affect: Mood and affect normal.        Speech: Speech  normal.        Behavior: Behavior normal.        Thought Content: Thought content normal.        Judgment: Judgment normal.     Comments: Calm affect. Making good eye contact.     Today's Vitals   10/02/24 0831  BP: 132/87  Pulse: 98  Temp: 98.1 F (36.7 C)  SpO2: 99%  Weight: 170 lb 2 oz (77.2 kg)  Height: 5' 1.5 (1.562 m)   Body mass index is 31.62 kg/m.         Assessment & Plan:  1. Primary hypertension Blood pressure well-controlled. - Continue current antihypertensive medication regimen.  2. Chronic migraine without aura without status migrainosus, not intractable - Continue topiramate  for migraine prevention. - Use Ubrelvy  as needed for acute migraine attacks. - Consider using a headache tracking app to identify potential triggers  3. Moderate persistent asthma without complication - Patient to investigate insurance formulary changes for Pulmicort . - Obtain prior authorization if required. - Ensure Pulmicort  prescription is filled.  4. Hyperlipidemia LDL goal <100 - Monitor cholesterol levels and labs before next follow-up.  - Discuss potential interventions to lower LDL cholesterol at next visit.  5. Generalized anxiety disorder (Primary) Fluoxetine  causing significant sexual side effects, fatigue, and weight gain. Bupropion  considered to counteract side effects and improve energy. - Initiated bupropion  150 mg once daily in the morning. - Monitor for side effects such as increased anxiety, depression, or unusual thoughts. - Discontinue bupropion  and notify provider if adverse effects occur. - Continue current SSRI regimen. - Encouraged communication regarding medication effects and side effects.  - buPROPion  (WELLBUTRIN  XL) 150 MG 24 hr tablet; Take 1 tablet (150 mg total) by mouth daily. Each morning  Dispense: 30 tablet; Refill: 0 - busPIRone  (BUSPAR ) 15 MG tablet; Take 1 tablet (15 mg total) by mouth 2 (two) times daily.  Dispense: 180 tablet; Refill:  1 - FLUoxetine  (PROZAC ) 40 MG capsule; TAKE ONE CAPSULE (40MG  TOTAL) BY MOUTH DAILY  Dispense: 90 capsule; Refill: 1  6. Persistent depressive disorder Fluoxetine  causing significant sexual side effects, fatigue, and weight gain. Bupropion  considered to counteract side effects and improve energy. - Initiated bupropion  150 mg once daily in the morning. - Monitor for side effects such as increased anxiety, depression, or unusual thoughts. - Discontinue bupropion  and notify provider if adverse effects occur. - Continue current SSRI regimen. - Encouraged communication regarding medication effects and side effects.  - buPROPion  (WELLBUTRIN  XL) 150 MG 24 hr tablet; Take 1 tablet (150 mg total) by mouth daily. Each morning  Dispense: 30 tablet; Refill: 0 -  FLUoxetine  (PROZAC ) 40 MG capsule; TAKE ONE CAPSULE (40MG  TOTAL) BY MOUTH DAILY  Dispense: 90 capsule; Refill: 1  - Continue routine health maintenance and screenings. - Ensure regular follow-up with gynecology for female health maintenance.  Return in about 3 months (around 12/31/2024).            [1]  Social History Tobacco Use   Smoking status: Never   Smokeless tobacco: Never  Vaping Use   Vaping status: Never Used  Substance Use Topics   Alcohol use: No   Drug use: No   "

## 2024-10-03 ENCOUNTER — Encounter: Payer: Self-pay | Admitting: Nurse Practitioner

## 2024-10-03 DIAGNOSIS — E785 Hyperlipidemia, unspecified: Secondary | ICD-10-CM | POA: Insufficient documentation

## 2024-10-03 DIAGNOSIS — E7800 Pure hypercholesterolemia, unspecified: Secondary | ICD-10-CM | POA: Insufficient documentation

## 2024-10-08 ENCOUNTER — Other Ambulatory Visit: Payer: Self-pay | Admitting: Family Medicine

## 2024-10-09 ENCOUNTER — Telehealth: Payer: Self-pay | Admitting: Nurse Practitioner

## 2024-10-09 ENCOUNTER — Other Ambulatory Visit: Payer: Self-pay | Admitting: Family Medicine

## 2024-10-09 MED ORDER — ALBUTEROL SULFATE HFA 108 (90 BASE) MCG/ACT IN AERS: 2.0000 | INHALATION_SPRAY | RESPIRATORY_TRACT | 6 refills | Status: AC | PRN

## 2024-10-09 MED ORDER — QVAR REDIHALER 80 MCG/ACT IN AERB: 2.0000 | INHALATION_SPRAY | Freq: Two times a day (BID) | RESPIRATORY_TRACT | 12 refills | Status: AC

## 2024-10-09 NOTE — Telephone Encounter (Signed)
 Reason for CRM: pt was req to speak to clinic about Pulmicort  . checked messaged and let pt know what messages stated. pt got upset and stated she will message on mychart since i was not transfering her and hung up. Pt did not let me transfer her, I was checking notes first

## 2024-12-30 ENCOUNTER — Ambulatory Visit: Admitting: Nurse Practitioner
